# Patient Record
Sex: Female | Born: 1950 | Race: Black or African American | Hispanic: No | Marital: Single | Smoking: Former smoker
Health system: Southern US, Community
[De-identification: ages and names within clinical notes are randomized; demographics above are authoritative.]

## PROBLEM LIST (undated history)

## (undated) DIAGNOSIS — E785 Hyperlipidemia, unspecified: Secondary | ICD-10-CM

## (undated) DIAGNOSIS — F419 Anxiety disorder, unspecified: Secondary | ICD-10-CM

## (undated) DIAGNOSIS — F32A Depression, unspecified: Secondary | ICD-10-CM

## (undated) DIAGNOSIS — T884XXA Failed or difficult intubation, initial encounter: Secondary | ICD-10-CM

## (undated) DIAGNOSIS — Z803 Family history of malignant neoplasm of breast: Secondary | ICD-10-CM

## (undated) DIAGNOSIS — F329 Major depressive disorder, single episode, unspecified: Secondary | ICD-10-CM

## (undated) DIAGNOSIS — M1990 Unspecified osteoarthritis, unspecified site: Secondary | ICD-10-CM

## (undated) DIAGNOSIS — T7840XA Allergy, unspecified, initial encounter: Secondary | ICD-10-CM

## (undated) DIAGNOSIS — R0600 Dyspnea, unspecified: Secondary | ICD-10-CM

## (undated) DIAGNOSIS — M1711 Unilateral primary osteoarthritis, right knee: Secondary | ICD-10-CM

## (undated) DIAGNOSIS — I8390 Asymptomatic varicose veins of unspecified lower extremity: Secondary | ICD-10-CM

## (undated) HISTORY — DX: Hyperlipidemia, unspecified: E78.5

## (undated) HISTORY — DX: Anxiety disorder, unspecified: F41.9

## (undated) HISTORY — DX: Unspecified osteoarthritis, unspecified site: M19.90

## (undated) HISTORY — PX: KNEE SURGERY: SHX244

## (undated) HISTORY — PX: HYSTEROSCOPY: SHX211

## (undated) HISTORY — DX: Major depressive disorder, single episode, unspecified: F32.9

## (undated) HISTORY — DX: Family history of malignant neoplasm of breast: Z80.3

## (undated) HISTORY — DX: Allergy, unspecified, initial encounter: T78.40XA

## (undated) HISTORY — DX: Depression, unspecified: F32.A

## (undated) HISTORY — PX: GUM SURGERY: SHX658

---

## 2000-07-02 ENCOUNTER — Encounter: Payer: Self-pay | Admitting: Obstetrics and Gynecology

## 2000-07-02 ENCOUNTER — Encounter: Admission: RE | Admit: 2000-07-02 | Discharge: 2000-07-02 | Payer: Self-pay | Admitting: Obstetrics and Gynecology

## 2001-07-12 ENCOUNTER — Encounter: Admission: RE | Admit: 2001-07-12 | Discharge: 2001-07-12 | Payer: Self-pay | Admitting: Obstetrics and Gynecology

## 2001-07-12 ENCOUNTER — Encounter: Payer: Self-pay | Admitting: Obstetrics and Gynecology

## 2001-09-04 ENCOUNTER — Other Ambulatory Visit: Admission: RE | Admit: 2001-09-04 | Discharge: 2001-09-04 | Payer: Self-pay | Admitting: Obstetrics and Gynecology

## 2002-04-15 ENCOUNTER — Other Ambulatory Visit: Admission: RE | Admit: 2002-04-15 | Discharge: 2002-04-15 | Payer: Self-pay | Admitting: Family Medicine

## 2002-07-14 ENCOUNTER — Encounter: Payer: Self-pay | Admitting: Family Medicine

## 2002-07-14 ENCOUNTER — Encounter: Admission: RE | Admit: 2002-07-14 | Discharge: 2002-07-14 | Payer: Self-pay | Admitting: Family Medicine

## 2003-04-13 ENCOUNTER — Other Ambulatory Visit: Admission: RE | Admit: 2003-04-13 | Discharge: 2003-04-13 | Payer: Self-pay | Admitting: Family Medicine

## 2003-07-20 ENCOUNTER — Encounter: Admission: RE | Admit: 2003-07-20 | Discharge: 2003-07-20 | Payer: Self-pay | Admitting: Family Medicine

## 2003-07-20 ENCOUNTER — Encounter: Payer: Self-pay | Admitting: Family Medicine

## 2004-04-13 ENCOUNTER — Other Ambulatory Visit: Admission: RE | Admit: 2004-04-13 | Discharge: 2004-04-13 | Payer: Self-pay | Admitting: Family Medicine

## 2004-07-25 ENCOUNTER — Encounter: Admission: RE | Admit: 2004-07-25 | Discharge: 2004-07-25 | Payer: Self-pay | Admitting: Family Medicine

## 2005-05-23 ENCOUNTER — Ambulatory Visit: Payer: Self-pay | Admitting: Family Medicine

## 2005-06-16 ENCOUNTER — Other Ambulatory Visit: Admission: RE | Admit: 2005-06-16 | Discharge: 2005-06-16 | Payer: Self-pay | Admitting: Family Medicine

## 2005-06-16 ENCOUNTER — Ambulatory Visit: Payer: Self-pay | Admitting: Family Medicine

## 2005-06-21 ENCOUNTER — Encounter: Admission: RE | Admit: 2005-06-21 | Discharge: 2005-06-21 | Payer: Self-pay | Admitting: Family Medicine

## 2005-06-23 ENCOUNTER — Ambulatory Visit: Payer: Self-pay | Admitting: Family Medicine

## 2005-07-28 ENCOUNTER — Encounter: Admission: RE | Admit: 2005-07-28 | Discharge: 2005-07-28 | Payer: Self-pay | Admitting: Family Medicine

## 2005-08-31 ENCOUNTER — Ambulatory Visit: Payer: Self-pay | Admitting: Family Medicine

## 2005-09-19 ENCOUNTER — Ambulatory Visit (HOSPITAL_COMMUNITY): Admission: RE | Admit: 2005-09-19 | Discharge: 2005-09-19 | Payer: Self-pay | Admitting: Obstetrics and Gynecology

## 2005-09-19 ENCOUNTER — Encounter (INDEPENDENT_AMBULATORY_CARE_PROVIDER_SITE_OTHER): Payer: Self-pay | Admitting: Specialist

## 2005-10-30 ENCOUNTER — Ambulatory Visit: Payer: Self-pay | Admitting: Family Medicine

## 2006-01-09 ENCOUNTER — Ambulatory Visit: Payer: Self-pay | Admitting: Family Medicine

## 2006-06-15 ENCOUNTER — Ambulatory Visit: Payer: Self-pay | Admitting: Family Medicine

## 2006-06-29 ENCOUNTER — Other Ambulatory Visit: Admission: RE | Admit: 2006-06-29 | Discharge: 2006-06-29 | Payer: Self-pay | Admitting: Family Medicine

## 2006-06-29 ENCOUNTER — Ambulatory Visit: Payer: Self-pay | Admitting: Family Medicine

## 2006-07-30 ENCOUNTER — Encounter: Admission: RE | Admit: 2006-07-30 | Discharge: 2006-07-30 | Payer: Self-pay | Admitting: Family Medicine

## 2007-07-04 DIAGNOSIS — F329 Major depressive disorder, single episode, unspecified: Secondary | ICD-10-CM

## 2007-07-04 DIAGNOSIS — J309 Allergic rhinitis, unspecified: Secondary | ICD-10-CM

## 2007-07-04 DIAGNOSIS — F32A Depression, unspecified: Secondary | ICD-10-CM | POA: Insufficient documentation

## 2007-07-19 ENCOUNTER — Ambulatory Visit: Payer: Self-pay | Admitting: Family Medicine

## 2007-07-19 LAB — CONVERTED CEMR LAB
ALT: 18 units/L (ref 0–35)
AST: 19 units/L (ref 0–37)
Albumin: 3.6 g/dL (ref 3.5–5.2)
Bilirubin Urine: NEGATIVE
Bilirubin, Direct: 0.1 mg/dL (ref 0.0–0.3)
Calcium: 9.2 mg/dL (ref 8.4–10.5)
Chloride: 104 meq/L (ref 96–112)
Cholesterol: 166 mg/dL (ref 0–200)
Creatinine, Ser: 0.7 mg/dL (ref 0.4–1.2)
GFR calc non Af Amer: 92 mL/min
HDL: 42.2 mg/dL (ref >39.0)
Hemoglobin: 13.4 g/dL (ref 12.0–15.0)
LDL Cholesterol: 104 mg/dL — ABNORMAL HIGH (ref 0–99)
Lymphocytes Relative: 23.9 % (ref 12.0–46.0)
MCHC: 33.7 g/dL (ref 30.0–36.0)
MCV: 82.4 fL (ref 78.0–100.0)
Platelets: 295 10*3/uL (ref 150–400)
Potassium: 4.3 meq/L (ref 3.5–5.1)
Protein, U semiquant: NEGATIVE
RDW: 12.8 % (ref 11.5–14.6)
Specific Gravity, Urine: 1.015
TSH: 1.96 microintl units/mL (ref 0.35–5.50)
Total Bilirubin: 0.9 mg/dL (ref 0.3–1.2)
Total CHOL/HDL Ratio: 3.9
Total Protein: 6.3 g/dL (ref 6.0–8.3)
VLDL: 20 mg/dL (ref 0–40)
WBC: 6 10*3/uL (ref 4.5–10.5)
pH: 7.5

## 2007-07-25 ENCOUNTER — Encounter: Payer: Self-pay | Admitting: Family Medicine

## 2007-07-25 ENCOUNTER — Other Ambulatory Visit: Admission: RE | Admit: 2007-07-25 | Discharge: 2007-07-25 | Payer: Self-pay | Admitting: Family Medicine

## 2007-07-26 ENCOUNTER — Ambulatory Visit: Payer: Self-pay | Admitting: Family Medicine

## 2007-07-26 DIAGNOSIS — E785 Hyperlipidemia, unspecified: Secondary | ICD-10-CM

## 2007-07-31 ENCOUNTER — Ambulatory Visit: Payer: Self-pay | Admitting: Internal Medicine

## 2007-07-31 ENCOUNTER — Encounter: Payer: Self-pay | Admitting: Family Medicine

## 2007-08-01 ENCOUNTER — Encounter: Admission: RE | Admit: 2007-08-01 | Discharge: 2007-08-01 | Payer: Self-pay | Admitting: Family Medicine

## 2007-08-07 ENCOUNTER — Encounter: Admission: RE | Admit: 2007-08-07 | Discharge: 2007-08-07 | Payer: Self-pay | Admitting: Family Medicine

## 2007-08-13 ENCOUNTER — Ambulatory Visit: Payer: Self-pay | Admitting: Internal Medicine

## 2007-08-13 ENCOUNTER — Telehealth: Payer: Self-pay | Admitting: Family Medicine

## 2007-08-16 ENCOUNTER — Telehealth: Payer: Self-pay | Admitting: Family Medicine

## 2007-09-05 ENCOUNTER — Ambulatory Visit: Payer: Self-pay | Admitting: Internal Medicine

## 2008-07-21 ENCOUNTER — Ambulatory Visit: Payer: Self-pay | Admitting: Family Medicine

## 2008-07-21 LAB — CONVERTED CEMR LAB
Albumin: 3.8 g/dL (ref 3.5–5.2)
BUN: 12 mg/dL (ref 6–23)
Basophils Relative: 0.5 % (ref 0.0–3.0)
Blood in Urine, dipstick: NEGATIVE
Chloride: 104 meq/L (ref 96–112)
Creatinine, Ser: 0.6 mg/dL (ref 0.4–1.2)
Eosinophils Relative: 2.6 % (ref 0.0–5.0)
Glucose, Bld: 80 mg/dL (ref 70–99)
HCT: 37.7 % (ref 36.0–46.0)
HDL: 50.4 mg/dL (ref >39.0)
Ketones, urine, test strip: NEGATIVE
LDL Cholesterol: 91 mg/dL (ref 0–99)
MCHC: 33.3 g/dL (ref 30.0–36.0)
MCV: 83 fL (ref 78.0–100.0)
Neutro Abs: 3.8 10*3/uL (ref 1.4–7.7)
RBC: 4.54 M/uL (ref 3.87–5.11)
RDW: 13.2 % (ref 11.5–14.6)
Sodium: 143 meq/L (ref 135–145)
Specific Gravity, Urine: 1.015
TSH: 1.23 microintl units/mL (ref 0.35–5.50)
Triglycerides: 132 mg/dL (ref 0–149)
VLDL: 26 mg/dL (ref 0–40)
WBC Urine, dipstick: NEGATIVE
WBC: 5.7 10*3/uL (ref 4.5–10.5)

## 2008-07-28 ENCOUNTER — Encounter: Payer: Self-pay | Admitting: Family Medicine

## 2008-07-28 ENCOUNTER — Other Ambulatory Visit: Admission: RE | Admit: 2008-07-28 | Discharge: 2008-07-28 | Payer: Self-pay | Admitting: Family Medicine

## 2008-07-28 ENCOUNTER — Ambulatory Visit: Payer: Self-pay | Admitting: Family Medicine

## 2008-08-03 ENCOUNTER — Encounter: Admission: RE | Admit: 2008-08-03 | Discharge: 2008-08-03 | Payer: Self-pay | Admitting: Family Medicine

## 2008-09-28 ENCOUNTER — Telehealth: Payer: Self-pay | Admitting: *Deleted

## 2008-12-10 ENCOUNTER — Ambulatory Visit: Payer: Self-pay | Admitting: Family Medicine

## 2009-04-08 DIAGNOSIS — R1031 Right lower quadrant pain: Secondary | ICD-10-CM | POA: Insufficient documentation

## 2009-04-22 ENCOUNTER — Ambulatory Visit: Payer: Self-pay | Admitting: Family Medicine

## 2009-04-23 ENCOUNTER — Encounter: Admission: RE | Admit: 2009-04-23 | Discharge: 2009-04-23 | Payer: Self-pay | Admitting: Family Medicine

## 2009-04-29 ENCOUNTER — Ambulatory Visit: Payer: Self-pay | Admitting: Family Medicine

## 2009-04-29 DIAGNOSIS — N83209 Unspecified ovarian cyst, unspecified side: Secondary | ICD-10-CM | POA: Insufficient documentation

## 2009-07-26 ENCOUNTER — Ambulatory Visit: Payer: Self-pay | Admitting: Family Medicine

## 2009-07-26 LAB — CONVERTED CEMR LAB
Albumin: 3.8 g/dL (ref 3.5–5.2)
Basophils Absolute: 0 10*3/uL (ref 0.0–0.1)
Basophils Relative: 0.5 % (ref 0.0–3.0)
Bilirubin Urine: NEGATIVE
Chloride: 108 meq/L (ref 96–112)
Eosinophils Absolute: 0.1 10*3/uL (ref 0.0–0.7)
Eosinophils Relative: 3 % (ref 0.0–5.0)
GFR calc non Af Amer: 109.82 mL/min (ref >60)
Glucose, Urine, Semiquant: NEGATIVE
HCT: 38.5 % (ref 36.0–46.0)
HDL: 47.2 mg/dL (ref >39.00)
Ketones, urine, test strip: NEGATIVE
LDL Cholesterol: 109 mg/dL — ABNORMAL HIGH (ref 0–99)
Lymphocytes Relative: 29.4 % (ref 12.0–46.0)
MCV: 82.8 fL (ref 78.0–100.0)
Monocytes Absolute: 0.6 10*3/uL (ref 0.1–1.0)
Monocytes Relative: 13 % — ABNORMAL HIGH (ref 3.0–12.0)
Neutro Abs: 2.8 10*3/uL (ref 1.4–7.7)
Nitrite: NEGATIVE
Platelets: 276 10*3/uL (ref 150.0–400.0)
Sodium: 147 meq/L — ABNORMAL HIGH (ref 135–145)
Specific Gravity, Urine: 1.015
TSH: 1.59 microintl units/mL (ref 0.35–5.50)
Total Protein: 6.9 g/dL (ref 6.0–8.3)
Triglycerides: 112 mg/dL (ref 0.0–149.0)
WBC: 5 10*3/uL (ref 4.5–10.5)
pH: 7

## 2009-08-01 ENCOUNTER — Encounter: Payer: Self-pay | Admitting: Family Medicine

## 2009-08-02 ENCOUNTER — Encounter: Payer: Self-pay | Admitting: Family Medicine

## 2009-08-02 ENCOUNTER — Ambulatory Visit: Payer: Self-pay | Admitting: Family Medicine

## 2009-08-02 ENCOUNTER — Other Ambulatory Visit: Admission: RE | Admit: 2009-08-02 | Discharge: 2009-08-02 | Payer: Self-pay | Admitting: Family Medicine

## 2009-08-04 ENCOUNTER — Encounter: Admission: RE | Admit: 2009-08-04 | Discharge: 2009-08-04 | Payer: Self-pay | Admitting: Family Medicine

## 2009-08-16 ENCOUNTER — Ambulatory Visit: Payer: Self-pay | Admitting: Internal Medicine

## 2009-08-16 ENCOUNTER — Encounter: Payer: Self-pay | Admitting: Family Medicine

## 2009-10-22 ENCOUNTER — Telehealth: Payer: Self-pay | Admitting: *Deleted

## 2009-10-26 ENCOUNTER — Encounter: Payer: Self-pay | Admitting: Family Medicine

## 2010-07-28 ENCOUNTER — Ambulatory Visit: Payer: Self-pay | Admitting: Family Medicine

## 2010-07-28 LAB — CONVERTED CEMR LAB
ALT: 18 units/L (ref 0–35)
AST: 21 units/L (ref 0–37)
BUN: 13 mg/dL (ref 6–23)
Basophils Absolute: 0.1 10*3/uL (ref 0.0–0.1)
Bilirubin, Direct: 0.1 mg/dL (ref 0.0–0.3)
Blood in Urine, dipstick: NEGATIVE
CO2: 29 meq/L (ref 19–32)
Calcium: 9.6 mg/dL (ref 8.4–10.5)
Chloride: 104 meq/L (ref 96–112)
Cholesterol: 180 mg/dL (ref 0–200)
Eosinophils Absolute: 0.2 10*3/uL (ref 0.0–0.7)
Glucose, Bld: 76 mg/dL (ref 70–99)
Hemoglobin: 12.5 g/dL (ref 12.0–15.0)
LDL Cholesterol: 101 mg/dL — ABNORMAL HIGH (ref 0–99)
Lymphs Abs: 1.6 10*3/uL (ref 0.7–4.0)
MCHC: 32.8 g/dL (ref 30.0–36.0)
MCV: 83.3 fL (ref 78.0–100.0)
Neutro Abs: 3.6 10*3/uL (ref 1.4–7.7)
Neutrophils Relative %: 60.2 % (ref 43.0–77.0)
Platelets: 270 10*3/uL (ref 150.0–400.0)
Specific Gravity, Urine: 1.02
TSH: 2.02 microintl units/mL (ref 0.35–5.50)
Total Bilirubin: 0.9 mg/dL (ref 0.3–1.2)
Total CHOL/HDL Ratio: 4
Urobilinogen, UA: 0.2
pH: 6.5

## 2010-08-04 ENCOUNTER — Ambulatory Visit: Payer: Self-pay | Admitting: Family Medicine

## 2010-08-04 ENCOUNTER — Other Ambulatory Visit: Admission: RE | Admit: 2010-08-04 | Discharge: 2010-08-04 | Payer: Self-pay | Admitting: Family Medicine

## 2010-08-04 ENCOUNTER — Telehealth: Payer: Self-pay

## 2010-08-05 ENCOUNTER — Encounter: Admission: RE | Admit: 2010-08-05 | Discharge: 2010-08-05 | Payer: Self-pay | Admitting: Family Medicine

## 2010-09-08 ENCOUNTER — Telehealth: Payer: Self-pay | Admitting: Family Medicine

## 2010-09-12 ENCOUNTER — Telehealth: Payer: Self-pay | Admitting: Family Medicine

## 2010-11-22 ENCOUNTER — Telehealth: Payer: Self-pay | Admitting: Family Medicine

## 2011-01-26 NOTE — Progress Notes (Signed)
Summary: celexa refill  Phone Note Call from Patient   Caller: Patient Call For: Roderick Pee MD Reason for Call: Refill Medication Summary of Call: patient is calling because she has increased her celexa to 2 at bedtime.  she would like a refill. is this okay to fill? Initial call taken by: Kern Reap CMA Duncan Dull),  September 08, 2010 4:56 PM  Follow-up for Phone Call        ok Follow-up by: Roderick Pee MD,  September 08, 2010 5:43 PM    New/Updated Medications: CELEXA 20 MG  TABS (CITALOPRAM HYDROBROMIDE) 2 tabs  at bedtime Prescriptions: CELEXA 20 MG  TABS (CITALOPRAM HYDROBROMIDE) 2 tabs  at bedtime  #60 x 3   Entered by:   Kern Reap CMA (AAMA)   Authorized by:   Roderick Pee MD   Signed by:   Kern Reap CMA (AAMA) on 09/08/2010   Method used:   Electronically to        [facility] (retail)       [address]       [place], [place]  [id number]       Ph: [phone]       Fax: [phone]   RxID:   [id number]

## 2011-01-26 NOTE — Assessment & Plan Note (Signed)
Summary: CPX/PAP/CJR   Vital Signs:  Patient profile:   60 year old female Menstrual status:  postmenopausal Height:      63.75 inches Weight:      167 pounds Temp:     98.6 degrees F oral BP sitting:   120 / 80  (left arm) Cuff size:   regular  Vitals Entered By: Kathrynn Speed CMA (August 04, 2010 2:39 PM) CC: cpx, lab review, pap, right heel pain x one month, src     Menstrual Status postmenopausal Last PAP Result NEGATIVE FOR INTRAEPITHELIAL LESIONS OR MALIGNANCY.   CC:  cpx, lab review, pap, right heel pain x one month, and src.  History of Present Illness: Tamara Hughes is a 60 year old female, who comes in today for annual physical examination because of underlying hyperlipidemia and depression.  Her hyperlipidemia is managed with Zocor 40 mg nightly her lipids.  Her goal to side effect from medication.  Also, one baby aspirin daily.  She takes Celexa 20 mg nightly for mild depression because her son is still in jail.  She states she is sleeping okay, but has to take an over-the-counter sleeping aid.  We advise to increase the Celexa to 30 mg a day.  She gets routine eye care, dental care, annual mammography, tetanus, 2006, seasonal flu.  However, she does not check her breasts monthly.  Advise to do BSE monthly  Current Medications (verified): 1)  Zocor 40 Mg Tabs (Simvastatin) .[name].. 1qd 2)  Celexa 20 Mg  Tabs (Citalopram Hydrobromide) .[name].. 1 Tab Once Daily 3)  Asa 81 4)  Glucosamine-Chondroitin  Caps (Glucosamine-Chondroit-Vit C-Mn) .... Once Day  Allergies (verified): 1)  ! Pcn 2)  ! Doxycycline 3)  ! Indocin  Past History:  Past medical, surgical, family and social histories (including risk factors) reviewed, and no changes noted (except as noted below).  Past Medical History: Reviewed history from 07/28/2008 and no changes required. Allergic rhinitis Depression childbirth x 1 Hyperlipidemia  Family History: Reviewed history from 07/04/2007 and no changes  required. Family History Hypertension Family History Thyroid disease  Social History: Reviewed history from 07/28/2008 and no changes required. Single school teacher Occupation: Never Smoked Alcohol use-no Drug use-no Regular exercise-yes  Review of Systems      See HPI  Physical Exam  General:  Well-developed,well-nourished,in no acute distress; alert,appropriate and cooperative throughout examination Head:  Normocephalic and atraumatic without obvious abnormalities. No apparent alopecia or balding. Eyes:  No corneal or conjunctival inflammation noted. EOMI. Perrla. Funduscopic exam benign, without hemorrhages, exudates or papilledema. Vision grossly normal. Ears:  External ear exam shows no significant lesions or deformities.  Otoscopic examination reveals clear canals, tympanic membranes are intact bilaterally without bulging, retraction, inflammation or discharge. Hearing is grossly normal bilaterally. Nose:  External nasal examination shows no deformity or inflammation. Nasal mucosa are pink and moist without lesions or exudates. Mouth:  Oral mucosa and oropharynx without lesions or exudates.  Teeth in good repair. Neck:  No deformities, masses, or tenderness noted. Chest Wall:  No deformities, masses, or tenderness noted. Breasts:  No mass, nodules, thickening, tenderness, bulging, retraction, inflamation, nipple discharge or skin changes noted.   Lungs:  Normal respiratory effort, chest expands symmetrically. Lungs are clear to auscultation, no crackles or wheezes. Heart:  Normal rate and regular rhythm. S1 and S2 normal without gallop, murmur, click, rub or other extra sounds. Abdomen:  Bowel sounds positive,abdomen soft and non-tender without masses, organomegaly or hernias noted. Rectal:  No external abnormalities noted. Normal sphincter  tone. No rectal masses or tenderness. Genitalia:  Pelvic Exam:        External: normal female genitalia without lesions or masses         Vagina: normal without lesions or masses        Cervix: normal without lesions or masses        Adnexa: normal bimanual exam without masses or fullness        Uterus: normal by palpation        Pap smear: performed Msk:  No deformity or scoliosis noted of thoracic or lumbar spine.   Pulses:  R and L carotid,radial,femoral,dorsalis pedis and posterior tibial pulses are full and equal bilaterally Extremities:  No clubbing, cyanosis, edema, or deformity noted with normal full range of motion of all joints.   Neurologic:  No cranial nerve deficits noted. Station and gait are normal. Plantar reflexes are down-going bilaterally. DTRs are symmetrical throughout. Sensory, motor and coordinative functions appear intact. Skin:  Intact without suspicious lesions or rashes Cervical Nodes:  No lymphadenopathy noted Axillary Nodes:  No palpable lymphadenopathy Inguinal Nodes:  No significant adenopathy Psych:  Cognition and judgment appear intact. Alert and cooperative with normal attention span and concentration. No apparent delusions, illusions, hallucinations   Impression & Recommendations:  Problem # 1:  HYPERLIPIDEMIA (ICD-272.4) Assessment Improved  Her updated medication list for this problem includes:    Zocor 40 Mg Tabs (Simvastatin) .[name]... 1qd  Orders: Prescription Created Electronically [phone])  Problem # 2:  DEPRESSION (ICD-311) Assessment: Deteriorated  Her updated medication list for this problem includes:    Celexa 20 Mg Tabs (Citalopram hydrobromide) .[name]... 1 & 1/2 at bedtime  Orders: Prescription Created Electronically [phone])  Problem # 3:  HEALTH SCREENING (ICD-V70.0) Assessment: Unchanged  Orders: Prescription Created Electronically [phone]) EKG w/ Interpretation ([id number])  Complete Medication List: 1)  Zocor 40 Mg Tabs (Simvastatin) .[name].. 1qd 2)  Celexa 20 Mg Tabs (Citalopram hydrobromide) .[name].. 1 & 1/2 at bedtime 3)  Asa 81  4)  Glucosamine-chondroitin Caps  (Glucosamine-chondroit-vit c-mn) .... Once day  Patient Instructions: 1)  continue the Zocor, and aspirin. 2)  Increase the Celexa to 30 mg daily.  If you don't see much improvement in 4 weeks.  Increase it to 40 mg. 3)  Please schedule a follow-up appointment in 1 year. 4)  Schedule your mammogram./////////bse monthly 5)  Schedule a colonoscopy/sigmoidoscopy to help detect colon cancer. 6)  Take calcium +Vitamin D daily. 7)  Take an Aspirin every day. Prescriptions: CELEXA 20 MG  TABS (CITALOPRAM HYDROBROMIDE) 1 & 1/2 at bedtime  #150 x 3   Entered and Authorized by:   Roderick Pee MD   Signed by:   Roderick Pee MD on 08/04/2010   Method used:   Electronically to        [facility] [address] (retail)       [address]       [place], [place]  [id number]       Ph: [phone]       Fax: [phone]   RxID:   [id number] CELEXA 20 MG  TABS (CITALOPRAM HYDROBROMIDE) 1 tab once daily  #100 x 3   Entered and Authorized by:   Roderick Pee MD   Signed by:   Roderick Pee MD on 08/04/2010   Method used:   Electronically to        [facility] [address] (retail)       [address]       [place], [place]  [id number]       Ph: [phone]       Fax: [phone]   RxID:   [id number] ZOCOR 40 MG TABS (SIMVASTATIN) 1qd  #100 Tablet x 3   Entered and Authorized by:   Roderick Pee MD   Signed by:   Roderick Pee MD on 08/04/2010   Method used:   Electronically to        [facility] (retail)       [address]       [place], [place]  [id number]       Ph: [phone]       Fax: [phone]   RxID:   [id number]

## 2011-01-26 NOTE — Progress Notes (Signed)
Summary: PHARMACY VERIFICATION ON Rx  Phone Note From Pharmacy   Caller: [facility] Summary of Call: Pharmacy needs to know which Celexa 20mg   Rx is correct.... They adv that they received 2 different Rx for the same med, for the same pt?     Pharmacy can be reached at  [phone].  Initial call taken by: Debbra Riding,  August 04, 2010 3:55 PM  Follow-up for Phone Call        seen today - per dr. Nelida Meuse note - increaseing to 30mg  . disregurd 20mg . once daily - called pharmacy to ok. KIK Follow-up by: Duard Brady LPN,  August 04, 2010 4:51 PM

## 2011-01-26 NOTE — Progress Notes (Signed)
Summary: refill request 90 days  Phone Note Refill Request Message from:  Fax from Pharmacy on September 12, 2010 2:22 PM  Refills Requested: Medication #1:  CELEXA 20 MG  TABS 2 tabs  at bedtime Initial call taken by: Kern Reap CMA Duncan Dull),  September 12, 2010 2:22 PM    Prescriptions: CELEXA 20 MG  TABS (CITALOPRAM HYDROBROMIDE) 2 tabs  at bedtime  #180 x 3   Entered by:   Kern Reap CMA (AAMA)   Authorized by:   Roderick Pee MD   Signed by:   Kern Reap CMA (AAMA) on 09/12/2010   Method used:   Electronically to        [facility] [address] (retail)       [address]       [place], [place]  [id number]       Ph: [phone]       Fax: [phone]   RxID:   [id number]

## 2011-01-26 NOTE — Progress Notes (Signed)
Summary: Pt req med for sinus inf. Pls call in to [facility] on [employer] Note Call from Patient Call back at Work Phone [phone]   Caller: Patient Summary of Call: Pt called and is req a med for sinus inf. Pt has head congestion and drainage in throat for 3 wks.  Pls call in to [facility] on corner of [address] and [employer].  Initial call taken by: Lucy Antigua,  November 22, 2010 11:57 AM  Follow-up for Phone Call        prednisone 20 mg........... dispense 30 tablets........ directions two tabs x 3 days, one x 3 days, a half x 3 days, then half a tablet Monday, Wednesday, Friday, for a two week taper.  Office visit if symptoms do not improve after one week Follow-up by: Roderick Pee MD,  November 22, 2010 1:50 PM  Additional Follow-up for Phone Call Additional follow up Details #1::        Phone Call Completed, Rx Called In Additional Follow-up by: Kern Reap CMA Duncan Dull),  November 22, 2010 5:09 PM

## 2011-05-12 NOTE — Op Note (Signed)
NAMEJENNAE, HAKEEM                  ACCOUNT NO.:  [id number]   MEDICAL RECORD NO.:  [id number]          PATIENT TYPE:  AMB   LOCATION:  SDC                           FACILITY:  [facility]   PHYSICIAN:  Richardean Sale, M.D.   DATE OF BIRTH:  1951-04-10   DATE OF PROCEDURE:  09/19/2005  DATE OF DISCHARGE:                                 OPERATIVE REPORT   PREOPERATIVE DIAGNOSIS:  Menorrhagia, endometrial polyp   POSTOPERATIVE DIAGNOSIS:  Menorrhagia, endometrial polyp.   PROCEDURE:  Hysteroscopy D&C with removal of endometrial polyp.   SURGEON:  Richardean Sale, M.D.   ASSISTANT:  None.   ANESTHESIA:  General.   COMPLICATIONS:  None.   SPECIMENS:  Endometrial polyp and endometrial curettings to pathology.   ESTIMATED BLOOD LOSS:  100 mL   FLUID DEFICIT:  20 mL   INDICATIONS:  This is a 60 year old African-American female who has been  having progressively worsening menses that have been slightly improved with  oral contraceptive pills. The patient underwent ultrasound evaluation and  sonohysterogram which revealed presence of an endometrial polyp. The patient  presents today for hysteroscopy D&C and removal of the polyp. Prior to  procedure, the risks, benefits, alternatives of the procedure reviewed with  the patient in detail. Discussed the risk which include but are not limited  to hemorrhage, infection, uterine perforation which could require additional  surgery through the abdomen to evaluate for injury to the bowel or the  bladder and also reviewed general anesthetic related risks including deep  venous thrombosis. The patient voiced understanding of these risks before  proceeding to the OR and informed consent was obtained, all questions  answered.   DESCRIPTION OF PROCEDURE:  The patient was taken to the operating room where  she was given general anesthetic. She was prepped in usual sterile fashion  with Betadine, a red rubber catheter was used to drain the bladder.  Bimanual  examination confirmed the presence of a retroverted uterus approximately 6-8  weeks size, mobile, no adnexal masses palpated. A speculum was then placed  in the vagina and a paracervical block was administered using a total of 20  mL of 1% Nesacaine. Single tooth tenaculum was used to grasp the anterior  lip of cervix and the cervix was then very gently dilated with Hagar  dilators. The diagnostic hysteroscope was then introduced and revealed a  polyp originating from the fundus of the uterus. The tubal ostia appeared  normal. The rest of the cavity appeared normal. There did appear to be a  questionable polyp versus a fibroid that was very small originating from the  posterior aspect of the uterus at the uterocervical junction. The  hysteroscope was removed. The polyp forceps were introduced. The fundal  polyp was removed easily. This was followed by a sharp curettage. Specimens  were sent to pathology labeled as endometrial polyp and endometrial  curettings. At this point the diagnostic scope was reintroduced but  visualization was inadequate due to bleeding. I was unable to determine  whether or not the structure at the junction of the uterus and  cervix had  been removed. Therefore, the cervix was then dilated. An attempt was made to  insert the resectoscope but was unsuccessful due to inadequate dilation of  the cervix. At this point the diagnostic scope was reintroduced.  Visualization at this point was much better. There is no evidence of any  remaining polyp or any fibroid present in the cavity and no active bleeding,  and therefore the procedure was terminated. The hysteroscope was removed and  the single-tooth tenaculum was removed. There was very brisk bleeding coming  from the tenaculum site and a ring forceps was placed on the anterior lip of  the cervix with an attempt to stop the bleeding with compression. After  approximately 2 minutes the tenaculum was removed and  there was still brisk  bleeding coming from both tenaculum sites. Therefore silver nitrate was  applied but hemostasis could not be achieved. Therefore two sutures were  placed of  0 Vicryl in a figure-of-eight fashion over both tenaculum sites  and hemostasis was achieved. At this point all instruments were removed from  the patient's vagina. All sponge, lap, needle, and instrument counts were  correct x2. The patient was then taken out dorsal lithotomy position and was  awakened from anesthesia and was transferred to recovery room awake and in  stable condition.  There were no complications.   Given the patient's significant bleeding from tenaculum site, a bleeding  time and coagulation studies have been ordered.      Richardean Sale, M.D.  Electronically Signed     JW/MEDQ  D:  09/19/2005  T:  09/20/2005  Job:  [id number]   cc:   Tinnie Gens A. Tawanna Cooler, M.D. [facility]  [address] [place]  [place] [address]

## 2011-06-29 ENCOUNTER — Encounter: Payer: Self-pay | Admitting: Family Medicine

## 2011-06-29 ENCOUNTER — Ambulatory Visit (INDEPENDENT_AMBULATORY_CARE_PROVIDER_SITE_OTHER): Payer: BC Managed Care – PPO | Admitting: Family Medicine

## 2011-06-29 DIAGNOSIS — N644 Mastodynia: Secondary | ICD-10-CM

## 2011-06-29 MED ORDER — CITALOPRAM HYDROBROMIDE 20 MG PO TABS: 20.0000 mg | ORAL_TABLET | Freq: Every day | ORAL | Status: DC

## 2011-06-29 NOTE — Patient Instructions (Signed)
Take Motrin, 600 mg twice daily with food for the chest wall pain as needed.  Return p.r.n.  Continue to check her breasts monthly.  Annual mammography in August

## 2011-06-29 NOTE — Progress Notes (Signed)
  Subjective:    Patient ID: Tamara Hughes, female    DOB: Jul 04, 1951, 60 y.o.   MRN: [id number]  HPI Tamara Hughes is a 60 year old female, married, nonsmoker, who comes in today with a month's history of soreness in both breasts.  Two months ago she started exercise program and has been doing a lot of upper body exercises.  A month ago she began having soreness in both breasts.  She states as a dull ache.  It comes and goes.  She points to the 6 o'clock position in both breasts.  On the periphery as a source of her discomfort.   Review of Systems    General and GYN review of systems otherwise negative.  Last mammogram August 2011 normal Objective:   Physical Exam    Logo punishing in no acute distress.  Examination of both breasts as the breast to be symmetrical.  No skin changes.  No rash.  No nipple discharge.  No palpable abnormalities.  There is tenderness at the 6 o'clock position.  Both breasts, but it's in the chest wall muscles    Assessment & Plan:  Chest wall pain reassured Motrin, 600 b.i.d.

## 2011-07-14 ENCOUNTER — Other Ambulatory Visit: Payer: Self-pay | Admitting: Family Medicine

## 2011-07-14 DIAGNOSIS — Z1231 Encounter for screening mammogram for malignant neoplasm of breast: Secondary | ICD-10-CM

## 2011-08-02 ENCOUNTER — Other Ambulatory Visit (INDEPENDENT_AMBULATORY_CARE_PROVIDER_SITE_OTHER): Payer: BC Managed Care – PPO

## 2011-08-02 DIAGNOSIS — Z0000 Encounter for general adult medical examination without abnormal findings: Secondary | ICD-10-CM

## 2011-08-02 LAB — LIPID PANEL
HDL: 60.5 mg/dL (ref >39.00)
LDL Cholesterol: 98 mg/dL (ref 0–99)
Total CHOL/HDL Ratio: 3
Triglycerides: 124 mg/dL (ref 0.0–149.0)

## 2011-08-02 LAB — BASIC METABOLIC PANEL
CO2: 30 mEq/L (ref 19–32)
Chloride: 104 mEq/L (ref 96–112)
Sodium: 142 mEq/L (ref 135–145)

## 2011-08-02 LAB — POCT URINALYSIS DIPSTICK
Bilirubin, UA: NEGATIVE
Blood, UA: NEGATIVE
Glucose, UA: NEGATIVE
Leukocytes, UA: NEGATIVE
Nitrite, UA: NEGATIVE
Urobilinogen, UA: 0.2

## 2011-08-02 LAB — CBC WITH DIFFERENTIAL/PLATELET
Basophils Relative: 0.8 % (ref 0.0–3.0)
Eosinophils Absolute: 0.2 10*3/uL (ref 0.0–0.7)
Eosinophils Relative: 3.8 % (ref 0.0–5.0)
Hemoglobin: 12.8 g/dL (ref 12.0–15.0)
Lymphocytes Relative: 23.5 % (ref 12.0–46.0)
MCHC: 32.4 g/dL (ref 30.0–36.0)
MCV: 82.6 fl (ref 78.0–100.0)
Neutro Abs: 3.6 10*3/uL (ref 1.4–7.7)
RBC: 4.78 Mil/uL (ref 3.87–5.11)
WBC: 5.9 10*3/uL (ref 4.5–10.5)

## 2011-08-02 LAB — TSH: TSH: 1.21 u[IU]/mL (ref 0.35–5.50)

## 2011-08-02 LAB — HEPATIC FUNCTION PANEL
AST: 23 U/L (ref 0–37)
Albumin: 4.1 g/dL (ref 3.5–5.2)
Total Bilirubin: 0.9 mg/dL (ref 0.3–1.2)

## 2011-08-07 ENCOUNTER — Ambulatory Visit
Admission: RE | Admit: 2011-08-07 | Discharge: 2011-08-07 | Disposition: A | Discharge status: Home / Self Care | Payer: BC Managed Care – PPO | Source: Ambulatory Visit | Attending: Family Medicine | Admitting: Family Medicine

## 2011-08-07 DIAGNOSIS — Z1231 Encounter for screening mammogram for malignant neoplasm of breast: Secondary | ICD-10-CM

## 2011-08-14 ENCOUNTER — Encounter: Payer: Self-pay | Admitting: Family Medicine

## 2011-08-15 ENCOUNTER — Ambulatory Visit (INDEPENDENT_AMBULATORY_CARE_PROVIDER_SITE_OTHER): Payer: BC Managed Care – PPO | Admitting: Family Medicine

## 2011-08-15 ENCOUNTER — Other Ambulatory Visit (HOSPITAL_COMMUNITY)
Admission: RE | Admit: 2011-08-15 | Discharge: 2011-08-15 | Disposition: A | Discharge status: Home / Self Care | Payer: BC Managed Care – PPO | Source: Ambulatory Visit | Attending: Family Medicine | Admitting: Family Medicine

## 2011-08-15 ENCOUNTER — Encounter: Payer: Self-pay | Admitting: Family Medicine

## 2011-08-15 DIAGNOSIS — Z0000 Encounter for general adult medical examination without abnormal findings: Secondary | ICD-10-CM

## 2011-08-15 DIAGNOSIS — J309 Allergic rhinitis, unspecified: Secondary | ICD-10-CM

## 2011-08-15 DIAGNOSIS — F329 Major depressive disorder, single episode, unspecified: Secondary | ICD-10-CM

## 2011-08-15 DIAGNOSIS — Z01419 Encounter for gynecological examination (general) (routine) without abnormal findings: Secondary | ICD-10-CM | POA: Insufficient documentation

## 2011-08-15 DIAGNOSIS — E782 Mixed hyperlipidemia: Secondary | ICD-10-CM

## 2011-08-15 MED ORDER — SIMVASTATIN 40 MG PO TABS: 40.0000 mg | ORAL_TABLET | Freq: Every day | ORAL | Status: DC

## 2011-08-15 MED ORDER — CITALOPRAM HYDROBROMIDE 40 MG PO TABS: 40.0000 mg | ORAL_TABLET | Freq: Every day | ORAL | Status: DC

## 2011-08-15 NOTE — Patient Instructions (Signed)
Continue your current medications.  Follow-up in one year, sooner if any problems 

## 2011-08-15 NOTE — Progress Notes (Signed)
  Subjective:    Patient ID: Tamara Hughes, female    DOB: 10-18-1951, 60 y.o.   MRN: [id number]  Tamara Hughes Is a 60 year old female, nonsmoker, who comes in today for evaluation depression, and hyperlipidemia.  She currently takes Celexa 40 mg daily for depression and doing well.  She also takes Zocor 40 mg nightly along with baby aspirin, Lopressor, ago, with an LDL of 98.  She gets routine eye care, dental care, breast exam monthly, and your mammography, colonoscopy, normal, tetanus, 2006.  Information given on shingles.  Vaccination    Review of Systems  Constitutional: Negative.   HENT: Negative.   Eyes: Negative.   Respiratory: Negative.   Cardiovascular: Negative.   Gastrointestinal: Negative.   Genitourinary: Negative.   Musculoskeletal: Negative.   Neurological: Negative.   Hematological: Negative.   Psychiatric/Behavioral: Negative.        Objective:   Physical Exam  Constitutional: She appears well-developed and well-nourished.  HENT:  Head: Normocephalic and atraumatic.  Right Ear: External ear normal.  Left Ear: External ear normal.  Nose: Nose normal.  Mouth/Throat: Oropharynx is clear and moist.  Eyes: EOM are normal. Pupils are equal, round, and reactive to light.  Neck: Normal range of motion. Neck supple. No thyromegaly present.  Cardiovascular: Normal rate, regular rhythm, normal heart sounds and intact distal pulses.  Exam reveals no gallop and no friction rub.   No murmur heard. Pulmonary/Chest: Effort normal and breath sounds normal.  Abdominal: Soft. Bowel sounds are normal. She exhibits no distension and no mass. There is no tenderness. There is no rebound.  Genitourinary: Vagina normal and uterus normal. Guaiac negative stool. No vaginal discharge found.       Bilateral breast exam normal  Musculoskeletal: Normal range of motion.  Lymphadenopathy:    She has no cervical adenopathy.  Neurological: She is alert. She has normal reflexes. No cranial nerve  deficit. She exhibits normal muscle tone. Coordination normal.  Skin: Skin is warm and dry.  Psychiatric: She has a normal mood and affect. Her behavior is normal. Judgment and thought content normal.          Assessment & Plan:  Healthy female.  Depression, secondary to the fact that her son is still in jail.  Continue Celexa 40 mg daily............. He was given a 26 year sentence so far.  He served 11 years.  Hyperlipidemia.  Continue Zocor 40 and an aspirin tablet

## 2011-09-25 ENCOUNTER — Other Ambulatory Visit: Payer: Self-pay | Admitting: Family Medicine

## 2011-12-01 ENCOUNTER — Ambulatory Visit (INDEPENDENT_AMBULATORY_CARE_PROVIDER_SITE_OTHER): Payer: BC Managed Care – PPO | Admitting: Family Medicine

## 2011-12-01 ENCOUNTER — Encounter: Payer: Self-pay | Admitting: Family Medicine

## 2011-12-01 VITALS — BP 140/84 | Temp 98.6°F | Wt 168.0 lb

## 2011-12-01 DIAGNOSIS — N949 Unspecified condition associated with female genital organs and menstrual cycle: Secondary | ICD-10-CM

## 2011-12-01 DIAGNOSIS — R102 Pelvic and perineal pain: Secondary | ICD-10-CM

## 2011-12-01 LAB — CBC WITH DIFFERENTIAL/PLATELET
Basophils Absolute: 0 10*3/uL (ref 0.0–0.1)
HCT: 40.2 % (ref 36.0–46.0)
Lymphocytes Relative: 27 % (ref 12–46)
Neutro Abs: 3.7 10*3/uL (ref 1.7–7.7)
Neutrophils Relative %: 57 % (ref 43–77)
Platelets: 298 10*3/uL (ref 150–400)
RDW: 14.4 % (ref 11.5–15.5)
WBC: 6.5 10*3/uL (ref 4.0–10.5)

## 2011-12-01 LAB — POCT URINALYSIS DIPSTICK
Ketones, UA: NEGATIVE
Leukocytes, UA: NEGATIVE
Nitrite, UA: NEGATIVE
Protein, UA: NEGATIVE
Urobilinogen, UA: 0.2

## 2011-12-01 NOTE — Progress Notes (Signed)
  Subjective:    Patient ID: Tamara Hughes, female    DOB: [birth year], 60 y.o.   MRN: [id number]  HPI  Acute visit for one week history of right lower abdominal/pelvic pain. Duration is only a few seconds. Quality of pain is sharp. No clear exacerbating factors. No alleviating factors. Non-positional. No change in bowel habits. Patient denies any fever, chills, appetite or weight changes, urinary symptoms, or any vaginal spotting. Postmenopausal for 2 years now. Patient had normal Pap smear  this past August.  Relates prior colonoscopy within the past few years but not sure of exact date. No current pain. She is concerned because of duration of approximately one week. No history of any abdominal surgery.  Past Medical History  Diagnosis Date  . Allergy   . Depression   . Hyperlipidemia    No past surgical history on file.  reports that she has never smoked. She does not have any smokeless tobacco history on file. Her alcohol and drug histories not on file. family history includes Hyperlipidemia in her other; Hypertension in her other; and Thyroid disease in her other. Allergies  Allergen Reactions  . Doxycycline     REACTION: rash  . Indomethacin     REACTION: upset stomach  . Penicillins     REACTION: Hives      Review of Systems  Constitutional: Negative for fever, chills, appetite change and unexpected weight change.  Respiratory: Negative for cough and shortness of breath.   Cardiovascular: Negative for chest pain and leg swelling.  Gastrointestinal: Positive for abdominal pain. Negative for nausea, vomiting, diarrhea, constipation and blood in stool.  Genitourinary: Negative for dysuria, hematuria and flank pain.  Musculoskeletal: Negative for back pain.  Skin: Negative for rash.  Neurological: Negative for dizziness.       Objective:   Physical Exam  Constitutional: She appears well-developed and well-nourished. No distress.  HENT:  Mouth/Throat: Oropharynx is clear  and moist.  Cardiovascular: Normal rate and regular rhythm.   Pulmonary/Chest: Effort normal and breath sounds normal. No respiratory distress. She has no wheezes. She has no rales.  Abdominal: Soft. Bowel sounds are normal. She exhibits no distension and no mass. There is no rebound and no guarding.       Mildly tender right lower quadrant. No guarding or rebound  Genitourinary:       Bimanual exam reveals normal size uterus. No cervical motion tenderness. No adnexal masses or tenderness noted.          Assessment & Plan:  Right-sided pelvic pain. This is very fleeting only lasting a few seconds. Fairly nonfocal exam. Check urinalysis and CBC. Schedule pelvic ultrasound to further assess

## 2011-12-01 NOTE — Patient Instructions (Signed)
Follow up promptly for any fever or worsening pain. We will call you regarding ultrasound appointment

## 2011-12-04 ENCOUNTER — Other Ambulatory Visit: Payer: Self-pay | Admitting: Family Medicine

## 2011-12-04 DIAGNOSIS — R102 Pelvic and perineal pain: Secondary | ICD-10-CM

## 2011-12-05 NOTE — Progress Notes (Signed)
Quick Note:  Pt informed on home VM ______ 

## 2011-12-07 ENCOUNTER — Ambulatory Visit
Admission: RE | Admit: 2011-12-07 | Discharge: 2011-12-07 | Disposition: A | Discharge status: Home / Self Care | Payer: BC Managed Care – PPO | Source: Ambulatory Visit | Attending: Family Medicine | Admitting: Family Medicine

## 2011-12-07 DIAGNOSIS — R102 Pelvic and perineal pain: Secondary | ICD-10-CM

## 2011-12-08 NOTE — Progress Notes (Signed)
Quick Note:  Pt informed on home VM ______ 

## 2012-06-26 ENCOUNTER — Telehealth: Payer: Self-pay | Admitting: Family Medicine

## 2012-06-26 NOTE — Telephone Encounter (Signed)
Patient is calling because her father is starting to sleep more.

## 2012-06-26 NOTE — Telephone Encounter (Signed)
Pt requesting to be contact by Fleet Contras.

## 2012-06-29 NOTE — Telephone Encounter (Signed)
Daughter came into the office with wife and I explained that this can be normal but to call if any other symptoms are seen.  Daughter verbally agreed.

## 2012-07-08 ENCOUNTER — Other Ambulatory Visit: Payer: Self-pay | Admitting: Family Medicine

## 2012-07-08 DIAGNOSIS — Z1231 Encounter for screening mammogram for malignant neoplasm of breast: Secondary | ICD-10-CM

## 2012-08-12 ENCOUNTER — Ambulatory Visit
Admission: RE | Admit: 2012-08-12 | Discharge: 2012-08-12 | Disposition: A | Discharge status: Home / Self Care | Payer: BC Managed Care – PPO | Source: Ambulatory Visit | Attending: Family Medicine | Admitting: Family Medicine

## 2012-08-12 DIAGNOSIS — Z1231 Encounter for screening mammogram for malignant neoplasm of breast: Secondary | ICD-10-CM

## 2012-08-29 ENCOUNTER — Other Ambulatory Visit: Payer: Self-pay | Admitting: Family Medicine

## 2012-09-13 ENCOUNTER — Other Ambulatory Visit (INDEPENDENT_AMBULATORY_CARE_PROVIDER_SITE_OTHER): Payer: BC Managed Care – PPO

## 2012-09-13 DIAGNOSIS — Z0000 Encounter for general adult medical examination without abnormal findings: Secondary | ICD-10-CM

## 2012-09-13 LAB — POCT URINALYSIS DIPSTICK
Bilirubin, UA: NEGATIVE
Leukocytes, UA: NEGATIVE
Nitrite, UA: NEGATIVE
Protein, UA: NEGATIVE
pH, UA: 7

## 2012-09-13 LAB — CBC WITH DIFFERENTIAL/PLATELET
Eosinophils Relative: 3 % (ref 0.0–5.0)
HCT: 39.8 % (ref 36.0–46.0)
Lymphs Abs: 1.1 10*3/uL (ref 0.7–4.0)
MCHC: 31.9 g/dL (ref 30.0–36.0)
MCV: 83.1 fl (ref 78.0–100.0)
Monocytes Absolute: 0.6 10*3/uL (ref 0.1–1.0)
Platelets: 255 10*3/uL (ref 150.0–400.0)
RDW: 14 % (ref 11.5–14.6)

## 2012-09-13 LAB — BASIC METABOLIC PANEL
BUN: 14 mg/dL (ref 6–23)
CO2: 30 mEq/L (ref 19–32)
Chloride: 104 mEq/L (ref 96–112)
Glucose, Bld: 86 mg/dL (ref 70–99)
Potassium: 4.2 mEq/L (ref 3.5–5.1)

## 2012-09-13 LAB — HEPATIC FUNCTION PANEL
ALT: 15 U/L (ref 0–35)
Total Bilirubin: 0.7 mg/dL (ref 0.3–1.2)

## 2012-09-13 LAB — TSH: TSH: 1.18 u[IU]/mL (ref 0.35–5.50)

## 2012-09-13 LAB — LIPID PANEL: HDL: 49.1 mg/dL (ref >39.00)

## 2012-09-19 ENCOUNTER — Ambulatory Visit (INDEPENDENT_AMBULATORY_CARE_PROVIDER_SITE_OTHER): Payer: BC Managed Care – PPO | Admitting: Family Medicine

## 2012-09-19 ENCOUNTER — Encounter: Payer: Self-pay | Admitting: Family Medicine

## 2012-09-19 VITALS — BP 130/80 | Temp 98.4°F | Ht 63.25 in | Wt 167.0 lb

## 2012-09-19 DIAGNOSIS — E782 Mixed hyperlipidemia: Secondary | ICD-10-CM

## 2012-09-19 DIAGNOSIS — Z23 Encounter for immunization: Secondary | ICD-10-CM

## 2012-09-19 DIAGNOSIS — F329 Major depressive disorder, single episode, unspecified: Secondary | ICD-10-CM

## 2012-09-19 DIAGNOSIS — J309 Allergic rhinitis, unspecified: Secondary | ICD-10-CM

## 2012-09-19 MED ORDER — CITALOPRAM HYDROBROMIDE 40 MG PO TABS: 40.0000 mg | ORAL_TABLET | ORAL | Status: DC

## 2012-09-19 MED ORDER — SIMVASTATIN 40 MG PO TABS: 40.0000 mg | ORAL_TABLET | Freq: Every day | ORAL | Status: DC

## 2012-09-19 MED ORDER — LORAZEPAM 0.5 MG PO TABS: ORAL_TABLET | ORAL | Status: DC

## 2012-09-19 NOTE — Progress Notes (Signed)
  Subjective:    Patient ID: Tamara Hughes, female    DOB: Oct 19, 1951, 61 y.o.   MRN: [id number]  HPI Tamara Hughes is a 61 year old female who comes in today for general physical examination  She has a history of hyperlipidemia is on Zocor and aspirin  She takes Celexa 40 mg each bedtime for mild depression  Her father was here yesterday he is 79 years old and has metastatic cancer I called hospice he does have a large pleural effusion pulmonary is going to try to drain for comfort care  She gets routine eye care, dental care, and you mammography, recent colonoscopy, does not do BSE monthly. Pap smear last year normal LMP mid-50s. No history of any GYN problems therefore discussed screening pelvic exams and Paps every 3 years  Tetanus 2006 seasonal flu shot today   Review of Systems  Constitutional: Negative.   HENT: Negative.   Eyes: Negative.   Respiratory: Negative.   Cardiovascular: Negative.   Gastrointestinal: Negative.   Genitourinary: Negative.   Musculoskeletal: Negative.   Neurological: Negative.   Hematological: Negative.   Psychiatric/Behavioral: Negative.        Objective:   Physical Exam  Constitutional: She appears well-developed and well-nourished.  HENT:  Head: Normocephalic and atraumatic.  Right Ear: External ear normal.  Left Ear: External ear normal.  Nose: Nose normal.  Mouth/Throat: Oropharynx is clear and moist.  Eyes: EOM are normal. Pupils are equal, round, and reactive to light.  Neck: Normal range of motion. Neck supple. No thyromegaly present.  Cardiovascular: Normal rate, regular rhythm, normal heart sounds and intact distal pulses.  Exam reveals no gallop and no friction rub.   No murmur heard. Pulmonary/Chest: Effort normal and breath sounds normal.  Abdominal: Soft. Bowel sounds are normal. She exhibits no distension and no mass. There is no tenderness. There is no rebound.  Genitourinary:       Bilateral breast exam normal  Musculoskeletal:  Normal range of motion.  Lymphadenopathy:    She has no cervical adenopathy.  Neurological: She is alert. She has normal reflexes. No cranial nerve deficit. She exhibits normal muscle tone. Coordination normal.  Skin: Skin is warm and dry.  Psychiatric: She has a normal mood and affect. Her behavior is normal. Judgment and thought content normal.          Assessment & Plan:  Healthy female  Mild depression continue Celexa Ativan 0.5 each bedtime when necessary  Hyperlipidemia continue Zocor and aspirin  Return in one year sooner if any problems

## 2012-09-19 NOTE — Patient Instructions (Signed)
Continue your current medications  Ativan 0.5,,,,,,,,,,,, one tablet twice daily when necessary  Return in one year for general physical examination  Remember to do a thorough breast exam monthly

## 2012-12-16 ENCOUNTER — Ambulatory Visit (INDEPENDENT_AMBULATORY_CARE_PROVIDER_SITE_OTHER): Payer: BC Managed Care – PPO | Admitting: Family Medicine

## 2012-12-16 ENCOUNTER — Encounter: Payer: Self-pay | Admitting: Family Medicine

## 2012-12-16 VITALS — BP 142/92 | Temp 98.4°F | Wt 169.0 lb

## 2012-12-16 DIAGNOSIS — Z634 Disappearance and death of family member: Secondary | ICD-10-CM

## 2012-12-16 DIAGNOSIS — M79609 Pain in unspecified limb: Secondary | ICD-10-CM

## 2012-12-16 DIAGNOSIS — R030 Elevated blood-pressure reading, without diagnosis of hypertension: Secondary | ICD-10-CM

## 2012-12-16 DIAGNOSIS — M79643 Pain in unspecified hand: Secondary | ICD-10-CM

## 2012-12-16 NOTE — Patient Instructions (Addendum)
-   please see the bereavement counselor  -please follow up with Dr. Tawanna Cooler in one month to recheck your blood pressure and bereavement, stomach - or any concerns

## 2012-12-16 NOTE — Progress Notes (Signed)
Chief Complaint  Patient presents with  . right hand swelling    HPI:  Acute visit R hand discofort: - had felt stiff and swollen this morning - now resolved -raked leaves all day 2 days ago and thinks this is what it was - reports stomach upset on and off since father passed last month - denies weight loss, vomiting, bowel changes, has gas sometimes -also BP up - has been running in 140s/90s at home only since recent loss (before that was always low) -has been very stress  ROS: See pertinent positives and negatives per HPI.  Past Medical History  Diagnosis Date  . Allergy   . Depression   . Hyperlipidemia     Family History  Problem Relation Age of Onset  . Hyperlipidemia Other   . Hypertension Other   . Thyroid disease Other     History   Social History  . Marital Status: Single    Spouse Name: N/A    Number of Children: N/A  . Years of Education: N/A   Social History Main Topics  . Smoking status: Never Smoker   . Smokeless tobacco: None  . Alcohol Use:   . Drug Use:   . Sexually Active:    Other Topics Concern  . None   Social History Narrative  . None    Current outpatient prescriptions:aspirin 81 MG tablet, Take 81 mg by mouth daily.  , Disp: , Rfl: ;  citalopram (CELEXA) 40 MG tablet, Take 1 tablet (40 mg total) by mouth 1 day or 1 dose., Disp: 100 tablet, Rfl: 3;  glucosamine-chondroitin 500-400 MG tablet, Take 1 tablet by mouth daily.  , Disp: , Rfl: ;  LORazepam (ATIVAN) 0.5 MG tablet, One tablet twice daily when necessary, Disp: 30 tablet, Rfl: 2 simvastatin (ZOCOR) 40 MG tablet, Take 1 tablet (40 mg total) by mouth at bedtime., Disp: 100 tablet, Rfl: 3  EXAM:  Filed Vitals:   12/16/12 1444  BP: 142/92  Temp: 98.4 F (36.9 C)    There is no height on file to calculate BMI.  GENERAL: vitals reviewed and listed above, alert, oriented, appears well hydrated and in no acute distress  HEENT: atraumatic, conjunttiva clear, no obvious  abnormalities on inspection of external nose and ears  NECK: no obvious masses on inspection  LUNGS: clear to auscultation bilaterally, no wheezes, rales or rhonchi, good air movement  CV: HRRR, no peripheral edema  MS: moves all extremities without noticeable abnormality - hands are normal in appearance without any swelling, erythema or TTP, nomal raddial pulses  PSYCH: pleasant and cooperative, no obvious depression or anxiety  ASSESSMENT AND PLAN:  Discussed the following assessment and plan:  1. Hand pain   2. Elevated blood pressure   3. Bereavement    -advised hand discomfort may be OA after with raking causing the flare or slept on arm wrong - nothing abnormal on exam today, Tylenol for rare mild symptoms -discussed elevated BP and ? Starting BP meds, pt would like to follow up with PCP to recheck -I think mood and stomach issues likely bereavement, advised bereavement counseling and advised of free places to get this, pt will follow up with PCP in 1 month -Patient advised to return or notify a doctor immediately if symptoms worsen or persist or new concerns arise.  Patient Instructions  - please see the bereavement counselor  -please follow up with Dr. Tawanna Cooler in one month to recheck your blood pressure and bereavement, stomach - or  any concerns      Kriste Basque R.

## 2013-01-13 ENCOUNTER — Encounter: Payer: Self-pay | Admitting: Family Medicine

## 2013-01-13 ENCOUNTER — Ambulatory Visit (INDEPENDENT_AMBULATORY_CARE_PROVIDER_SITE_OTHER): Payer: BC Managed Care – PPO | Admitting: Family Medicine

## 2013-01-13 VITALS — BP 140/80 | Temp 98.9°F | Wt 171.0 lb

## 2013-01-13 DIAGNOSIS — F3289 Other specified depressive episodes: Secondary | ICD-10-CM

## 2013-01-13 DIAGNOSIS — F329 Major depressive disorder, single episode, unspecified: Secondary | ICD-10-CM

## 2013-01-13 DIAGNOSIS — R030 Elevated blood-pressure reading, without diagnosis of hypertension: Secondary | ICD-10-CM

## 2013-01-13 NOTE — Progress Notes (Signed)
  Subjective:    Patient ID: Tamara Hughes, female    DOB: 03/19/1951, 62 y.o.   MRN: [id number]  Tamara Hughes is a 62 year old female nonsmoker who comes in today for followup of hypertension  She was seen in 12/06/2023 after her father died in her blood pressure was elevated. She's been monitoring her blood pressure at home. Today her blood pressure is 140/80.    Review of Systems    review of systems negative Objective:   Physical Exam  Well-developed well-nourished female no acute distress BP right arm sitting position 140/80      Assessment & Plan:  Elevated blood pressure secondary to grief reaction plan reassured BP check weekly at home return in September for annual exam sooner if any problem

## 2013-01-14 ENCOUNTER — Ambulatory Visit: Payer: BC Managed Care – PPO | Admitting: Family Medicine

## 2013-02-13 ENCOUNTER — Ambulatory Visit (INDEPENDENT_AMBULATORY_CARE_PROVIDER_SITE_OTHER): Payer: BC Managed Care – PPO | Admitting: Family Medicine

## 2013-02-13 ENCOUNTER — Encounter: Payer: Self-pay | Admitting: Family Medicine

## 2013-02-13 VITALS — BP 130/80 | Temp 98.9°F | Wt 171.0 lb

## 2013-02-13 DIAGNOSIS — M6740 Ganglion, unspecified site: Secondary | ICD-10-CM

## 2013-02-13 DIAGNOSIS — M67439 Ganglion, unspecified wrist: Secondary | ICD-10-CM | POA: Insufficient documentation

## 2013-02-13 NOTE — Patient Instructions (Signed)
Motrin 600 mg twice daily with food  Elevation ice 15 minutes before bedtime  Short arm splint at bedtime  If after 2-3 weeks the symptoms persist then I would go see Dr. Molly Maduro sypher hand surgeon

## 2013-02-13 NOTE — Progress Notes (Signed)
  Subjective:    Patient ID: Tamara Hughes, female    DOB: 1951/10/06, 62 y.o.   MRN: [id number]  HPI Tamara Hughes is a 62 year old female nonsmoker who comes in today for evaluation of pain in her right hand for 2 weeks  No history of trauma   Review of Systems Review of systems otherwise negative    Objective:   Physical Exam  Well-developed well-nourished female no acute distress examination right hand shows a ganglion cyst medial ventral area the rest      Assessment & Plan:  Ganglion cyst left wrist plan Motrin splint hand surgery consult when necessary

## 2013-07-07 ENCOUNTER — Other Ambulatory Visit: Payer: Self-pay

## 2013-07-07 DIAGNOSIS — Z1231 Encounter for screening mammogram for malignant neoplasm of breast: Secondary | ICD-10-CM

## 2013-08-13 ENCOUNTER — Ambulatory Visit
Admission: RE | Admit: 2013-08-13 | Discharge: 2013-08-13 | Disposition: A | Discharge status: Home / Self Care | Payer: BC Managed Care – PPO | Source: Ambulatory Visit

## 2013-08-13 DIAGNOSIS — Z1231 Encounter for screening mammogram for malignant neoplasm of breast: Secondary | ICD-10-CM

## 2013-09-16 ENCOUNTER — Other Ambulatory Visit (INDEPENDENT_AMBULATORY_CARE_PROVIDER_SITE_OTHER): Payer: BC Managed Care – PPO

## 2013-09-16 DIAGNOSIS — Z0000 Encounter for general adult medical examination without abnormal findings: Secondary | ICD-10-CM

## 2013-09-16 LAB — CBC WITH DIFFERENTIAL/PLATELET
Basophils Absolute: 0.1 10*3/uL (ref 0.0–0.1)
Eosinophils Absolute: 0.2 10*3/uL (ref 0.0–0.7)
HCT: 39.2 % (ref 36.0–46.0)
Hemoglobin: 12.8 g/dL (ref 12.0–15.0)
Lymphs Abs: 1.1 10*3/uL (ref 0.7–4.0)
MCHC: 32.6 g/dL (ref 30.0–36.0)
MCV: 82.1 fl (ref 78.0–100.0)
Neutro Abs: 3.5 10*3/uL (ref 1.4–7.7)
RDW: 14.4 % (ref 11.5–14.6)

## 2013-09-16 LAB — BASIC METABOLIC PANEL
CO2: 30 mEq/L (ref 19–32)
Chloride: 108 mEq/L (ref 96–112)
Glucose, Bld: 83 mg/dL (ref 70–99)
Potassium: 4.1 mEq/L (ref 3.5–5.1)
Sodium: 143 mEq/L (ref 135–145)

## 2013-09-16 LAB — HEPATIC FUNCTION PANEL
Albumin: 3.9 g/dL (ref 3.5–5.2)
Total Protein: 6.9 g/dL (ref 6.0–8.3)

## 2013-09-16 LAB — POCT URINALYSIS DIPSTICK
Glucose, UA: NEGATIVE
Nitrite, UA: NEGATIVE
Protein, UA: NEGATIVE
Urobilinogen, UA: 0.2

## 2013-09-16 LAB — LIPID PANEL
Total CHOL/HDL Ratio: 3
Triglycerides: 104 mg/dL (ref 0.0–149.0)

## 2013-09-16 LAB — TSH: TSH: 0.78 u[IU]/mL (ref 0.35–5.50)

## 2013-09-23 ENCOUNTER — Other Ambulatory Visit (HOSPITAL_COMMUNITY)
Admission: RE | Admit: 2013-09-23 | Discharge: 2013-09-23 | Disposition: A | Discharge status: Home / Self Care | Payer: BC Managed Care – PPO | Source: Ambulatory Visit | Attending: Family | Admitting: Family

## 2013-09-23 ENCOUNTER — Encounter: Payer: BC Managed Care – PPO | Admitting: Family Medicine

## 2013-09-23 ENCOUNTER — Ambulatory Visit (INDEPENDENT_AMBULATORY_CARE_PROVIDER_SITE_OTHER): Payer: BC Managed Care – PPO | Admitting: Family

## 2013-09-23 ENCOUNTER — Encounter: Payer: Self-pay | Admitting: Family

## 2013-09-23 VITALS — BP 142/80 | HR 68 | Ht 63.75 in | Wt 168.0 lb

## 2013-09-23 DIAGNOSIS — F329 Major depressive disorder, single episode, unspecified: Secondary | ICD-10-CM

## 2013-09-23 DIAGNOSIS — Z124 Encounter for screening for malignant neoplasm of cervix: Secondary | ICD-10-CM

## 2013-09-23 DIAGNOSIS — E7800 Pure hypercholesterolemia, unspecified: Secondary | ICD-10-CM

## 2013-09-23 DIAGNOSIS — Z0000 Encounter for general adult medical examination without abnormal findings: Secondary | ICD-10-CM

## 2013-09-23 DIAGNOSIS — E782 Mixed hyperlipidemia: Secondary | ICD-10-CM

## 2013-09-23 DIAGNOSIS — Z01419 Encounter for gynecological examination (general) (routine) without abnormal findings: Secondary | ICD-10-CM | POA: Insufficient documentation

## 2013-09-23 MED ORDER — SIMVASTATIN 40 MG PO TABS: 40.0000 mg | ORAL_TABLET | Freq: Every day | ORAL | Status: DC

## 2013-09-23 MED ORDER — LORAZEPAM 0.5 MG PO TABS: ORAL_TABLET | ORAL | Status: DC

## 2013-09-23 MED ORDER — CITALOPRAM HYDROBROMIDE 40 MG PO TABS: 40.0000 mg | ORAL_TABLET | ORAL | Status: DC

## 2013-09-23 NOTE — Progress Notes (Signed)
  Subjective:    Patient ID: Tamara Hughes, female    DOB: May 22, 1951, 62 y.o.   MRN: [id number]  HPI 62 year old routine physical examination for this healthy  Female. Reviewed all health maintenance protocols including mammography colonoscopy bone density and reviewed appropriate screening labs. Her immunization history was reviewed as well as her current medications and allergies refills of her chronic medications were given and the plan for yearly health maintenance was discussed all orders and referrals were made as appropriate.   Review of Systems  Constitutional: Negative.   HENT: Negative.   Eyes: Negative.   Respiratory: Negative.   Cardiovascular: Negative.   Gastrointestinal: Negative.   Endocrine: Negative.   Genitourinary: Negative.   Musculoskeletal: Negative.   Skin: Negative.   Allergic/Immunologic: Negative.   Neurological: Negative.   Hematological: Negative.   Psychiatric/Behavioral: Negative.        Objective:   Physical Exam  Constitutional: She is oriented to person, place, and time. She appears well-developed and well-nourished.  HENT:  Head: Normocephalic and atraumatic.  Right Ear: External ear normal.  Left Ear: External ear normal.  Nose: Nose normal.  Mouth/Throat: Oropharynx is clear and moist.  Eyes: Conjunctivae and EOM are normal. Pupils are equal, round, and reactive to light.  Neck: Normal range of motion. Neck supple.  Cardiovascular: Normal rate, regular rhythm and normal heart sounds.   Pulmonary/Chest: Effort normal and breath sounds normal.  Abdominal: Soft. Bowel sounds are normal.  Genitourinary: Vagina normal and uterus normal. Guaiac negative stool. No vaginal discharge found.  Musculoskeletal: Normal range of motion. She exhibits no edema and no tenderness.  Neurological: She is alert and oriented to person, place, and time. She has normal reflexes. She displays normal reflexes. No cranial nerve deficit. Coordination normal.  Skin:  Skin is warm and dry.  Psychiatric: She has a normal mood and affect.          Assessment & Plan:  Assessments: 1. CPX 2. Hyperlipidemia 3. Depression 4. Allergic Rhinitis

## 2013-09-23 NOTE — Patient Instructions (Addendum)
Stress Management Stress is a state of physical or mental tension that often results from changes in your life or normal routine. Some common causes of stress are:  Death of a loved one.  Injuries or severe illnesses.  Getting fired or changing jobs.  Moving into a new home. Other causes may be:  Sexual problems.  Business or financial losses.  Taking on a large debt.  Regular conflict with someone at home or at work.  Constant tiredness from lack of sleep. It is not just bad things that are stressful. It may be stressful to:  Win the lottery.  Get married.  Buy a new car. The amount of stress that can be easily tolerated varies from person to person. Changes generally cause stress, regardless of the types of change. Too much stress can affect your health. It may lead to physical or emotional problems. Too little stress (boredom) may also become stressful. SUGGESTIONS TO REDUCE STRESS:  Talk things over with your family and friends. It often is helpful to share your concerns and worries. If you feel your problem is serious, you may want to get help from a professional counselor.  Consider your problems one at a time instead of lumping them all together. Trying to take care of everything at once may seem impossible. List all the things you need to do and then start with the most important one. Set a goal to accomplish 2 or 3 things each day. If you expect to do too many in a single day you will naturally fail, causing you to feel even more stressed.  Do not use alcohol or drugs to relieve stress. Although you may feel better for a short time, they do not remove the problems that caused the stress. They can also be habit forming.  Exercise regularly - at least 3 times per week. Physical exercise can help to relieve that "uptight" feeling and will relax you.  The shortest distance between despair and hope is often a good night's sleep.  Go to bed and get up on time allowing  yourself time for appointments without being rushed.  Take a short "time-out" period from any stressful situation that occurs during the day. Close your eyes and take some deep breaths. Starting with the muscles in your face, tense them, hold it for a few seconds, then relax. Repeat this with the muscles in your neck, shoulders, hand, stomach, back and legs.  Take good care of yourself. Eat a balanced diet and get plenty of rest.  Schedule time for having fun. Take a break from your daily routine to relax. HOME CARE INSTRUCTIONS   Call if you feel overwhelmed by your problems and feel you can no longer manage them on your own.  Return immediately if you feel like hurting yourself or someone else. Document Released: 06/06/2001 Document Revised: 03/04/2012 Document Reviewed: 01/27/2008 [employer] Patient Information 2014 [employer].  

## 2013-10-09 ENCOUNTER — Telehealth: Payer: Self-pay | Admitting: Family Medicine

## 2013-10-09 ENCOUNTER — Emergency Department (HOSPITAL_COMMUNITY)
Admission: EM | Admit: 2013-10-09 | Discharge: 2013-10-09 | Disposition: A | Discharge status: Home / Self Care | Payer: BC Managed Care – PPO | Attending: Emergency Medicine | Admitting: Emergency Medicine

## 2013-10-09 ENCOUNTER — Encounter (HOSPITAL_COMMUNITY): Payer: Self-pay | Admitting: Emergency Medicine

## 2013-10-09 DIAGNOSIS — F329 Major depressive disorder, single episode, unspecified: Secondary | ICD-10-CM | POA: Insufficient documentation

## 2013-10-09 DIAGNOSIS — I10 Essential (primary) hypertension: Secondary | ICD-10-CM

## 2013-10-09 DIAGNOSIS — Z880 Allergy status to penicillin: Secondary | ICD-10-CM | POA: Insufficient documentation

## 2013-10-09 DIAGNOSIS — R0789 Other chest pain: Secondary | ICD-10-CM | POA: Insufficient documentation

## 2013-10-09 DIAGNOSIS — Z7982 Long term (current) use of aspirin: Secondary | ICD-10-CM | POA: Insufficient documentation

## 2013-10-09 DIAGNOSIS — F3289 Other specified depressive episodes: Secondary | ICD-10-CM | POA: Insufficient documentation

## 2013-10-09 DIAGNOSIS — E785 Hyperlipidemia, unspecified: Secondary | ICD-10-CM | POA: Insufficient documentation

## 2013-10-09 NOTE — ED Notes (Signed)
Pt checks her own BP every am.  This am it was elevated when she checked it, it was 175/100.  Pt denies any other symptoms with this at the time.  Pt is not on BP meds.  Pt states that she has "some chest tightness" on arrival, no other symptoms, no sob.

## 2013-10-09 NOTE — Telephone Encounter (Signed)
Pt is calling to inquire about her BP, and which possible medications he would like her to take. She states that she went to the ER this morning, but that that they did not place her on any medications. She is bringing her mother to an appointment this afternoon at 4pm. Please advise.

## 2013-10-09 NOTE — Telephone Encounter (Signed)
Patient Information:  Caller Name: Paisli  Phone: [phone]  Patient: Tamara Hughes, Tamara Hughes  Gender: Female  DOB: 06-15-1951  Age: 62 Years  PCP: Kelle Darting [facility])  Office Follow Up:  Does the office need to follow up with this patient?: No  Instructions For The Office: N/A  RN Note:  Agreed to go to [facility] ED now. Recommended to ask coworker to drive her now.  Symptoms  Reason For Call & Symptoms: BP 175/100 and constant substernal chest "discomfort" rated 3/10 for several days.  BP is higher today than it has been.  Initially refused CSR recommendation to call 911. At work,  Denies shortness of breath, nausea or diaphoresis.  Reviewed Health History In EMR: N/A  Reviewed Medications In EMR: N/A  Reviewed Allergies In EMR: N/A  Reviewed Surgeries / Procedures: N/A  Date of Onset of Symptoms: 10/09/2013  Guideline(s) Used:  High Blood Pressure  Disposition Per Guideline:   Go to ED Now (or to Office with PCP Approval)  Reason For Disposition Reached:   BP > 160 / 100 and any cardiac or neurologic symptoms (e.g., chest pain, difficulty breathing, unsteady gait, blurred vision)  Advice Given:  N/A  RN Overrode Recommendation:  Go To ED  [facility] Gainer

## 2013-10-09 NOTE — ED Notes (Addendum)
Pt to ED for evaluation of high blood pressure reading at home this morning- denies taking medications for hypertension- denies headache or blurred vision.  Pt denies nay complaints at this time.

## 2013-10-09 NOTE — Telephone Encounter (Signed)
Patient came in with her mother today.  I checked her blood pressure it was 140/90.  I offered an appointment tomorrow with a provider.  Patient declined.  Informed patient to keep a record of her blood pressure and call back next week if she would like an office visit.

## 2013-10-09 NOTE — ED Provider Notes (Signed)
CSN: [id number]     Arrival date & time 10/09/13  0909 History   First MD Initiated Contact with Patient 10/09/13 0930     Chief Complaint  Patient presents with  . Hypertension    Pt seen with medical student, I performed history/physical/documentation   Patient is a 62 y.o. female presenting with hypertension. The history is provided by the patient.  Hypertension This is a new problem. Episode onset: today. The problem occurs constantly. The problem has been gradually improving. Pertinent negatives include no headaches and no shortness of breath. Nothing aggravates the symptoms. Nothing relieves the symptoms.   Pt presents for evaluation for HTN She reports her BP is usually lower She reports mild chest tightness for at least a week - denies radiation of pain, denies exertional CP No SOB No focal weakness No severe HA or visual changes noted She called her PCP who directed her to the ED  Past Medical History  Diagnosis Date  . Allergy   . Depression   . Hyperlipidemia    History reviewed. No pertinent past surgical history. Family History  Problem Relation Age of Onset  . Hyperlipidemia Other   . Hypertension Other   . Thyroid disease Other    History  Substance Use Topics  . Smoking status: Never Smoker   . Smokeless tobacco: Not on file  . Alcohol Use:    OB History   Grav Para Term Preterm Abortions TAB SAB Ect Mult Living                 Review of Systems  Constitutional: Negative for fever.  Eyes: Negative for visual disturbance.  Respiratory: Negative for shortness of breath.   Gastrointestinal: Negative for vomiting.  Neurological: Negative for weakness and headaches.  All other systems reviewed and are negative.    Allergies  Doxycycline; Indomethacin; and Penicillins  Home Medications   Current Outpatient Rx  Name  Route  Sig  Dispense  Refill  . aspirin 81 MG tablet   Oral   Take 81 mg by mouth daily.           . citalopram (CELEXA) 40  MG tablet   Oral   Take 1 tablet (40 mg total) by mouth 1 day or 1 dose.   100 tablet   3   . LORazepam (ATIVAN) 0.5 MG tablet   Oral   Take 0.5 mg by mouth 2 (two) times daily as needed for anxiety.         [name] OVER THE COUNTER MEDICATION   Both Eyes   Place 2 drops into both eyes daily as needed (dry eyes).         . simvastatin (ZOCOR) 40 MG tablet   Oral   Take 1 tablet (40 mg total) by mouth at bedtime.   100 tablet   3    BP 153/75  Pulse 68  Temp(Src) 98.7 F (37.1 C) (Oral)  SpO2 100% Physical Exam CONSTITUTIONAL: Well developed/well nourished, pt is watching TV, no distress HEAD: Normocephalic/atraumatic EYES: EOMI/PERRL ENMT: Mucous membranes moist NECK: supple no meningeal signs SPINE:entire spine nontender CV: S1/S2 noted, no murmurs/rubs/gallops noted LUNGS: Lungs are clear to auscultation bilaterally, no apparent distress ABDOMEN: soft, nontender, no rebound or guarding GU:no cva tenderness NEURO: Pt is awake/alert, moves all extremitiesx4, no facial droop.  No arm/leg drift.   EXTREMITIES: pulses normal, full ROM SKIN: warm, color normal PSYCH: no abnormalities of mood noted  ED Course  Procedures   EKG  Interpretation   None      Nursing notes from PCP office reviewed.  Pt tells me Chest tightness for at least a week and not related to today's episode, I doubt ACS/PE/Dissection at this time No signs of hypertensive emergency She is watching TV and in no distress Advised close PCP Followup.  Do not feel emergent therapy for HTN is required at this time  MDM  No diagnosis found. Nursing notes including past medical history and social history reviewed and considered in documentation Previous records reviewed and considered - nursing notes reviewed    Date: 10/09/2013  Rate: 66  Rhythm: normal sinus rhythm  QRS Axis: normal  Intervals: normal  ST/T Wave abnormalities: nonspecific ST changes  Conduction Disutrbances:none  Narrative  Interpretation:   Old EKG Reviewed: unchanged from 08/12    Joya Gaskins, MD 10/09/13 1047

## 2013-10-11 ENCOUNTER — Ambulatory Visit (INDEPENDENT_AMBULATORY_CARE_PROVIDER_SITE_OTHER): Payer: BC Managed Care – PPO | Admitting: Family Medicine

## 2013-10-11 ENCOUNTER — Encounter: Payer: Self-pay | Admitting: Family Medicine

## 2013-10-11 VITALS — BP 140/80 | Temp 99.2°F | Wt 168.0 lb

## 2013-10-11 DIAGNOSIS — I10 Essential (primary) hypertension: Secondary | ICD-10-CM | POA: Insufficient documentation

## 2013-10-11 MED ORDER — LISINOPRIL-HYDROCHLOROTHIAZIDE 10-12.5 MG PO TABS: 1.0000 | ORAL_TABLET | Freq: Every day | ORAL | Status: DC

## 2013-10-11 NOTE — Patient Instructions (Signed)
Begin the Zestoretic,,,,,,,, one tablet daily in the morning  Check your blood pressure daily in the morning  Return to the office next Thursday for followup with all your blood pressure readings and the device

## 2013-10-11 NOTE — Progress Notes (Signed)
  Subjective:    Patient ID: Tamara Hughes, female    DOB: 1951/11/14, 62 y.o.   MRN: [id number]  HPI Tamara Hughes is a 62 year old female who comes in today for evaluation of elevated blood pressure  Over the last couple weeks she's noticed her blood pressures been going up. She went to the emergency room at one time because her pressure was so high.  Positive family history of hypertension  She does have underlying hyperlipidemia   Review of Systems Review of systems otherwise negative    Objective:   Physical Exam wwell-developed well-nourished female no acute distress the blood pressure in the right arm sitting position was 160/90 pulse 70 and regular       Assessment & Plan:  New-onset hypertension,,,,,,,,,,, start ACE inhibitor followup in the office in one week

## 2013-10-16 ENCOUNTER — Ambulatory Visit (INDEPENDENT_AMBULATORY_CARE_PROVIDER_SITE_OTHER): Payer: BC Managed Care – PPO | Admitting: Family Medicine

## 2013-10-16 ENCOUNTER — Encounter: Payer: Self-pay | Admitting: Family Medicine

## 2013-10-16 VITALS — BP 120/80 | Temp 98.3°F | Wt 168.0 lb

## 2013-10-16 DIAGNOSIS — I10 Essential (primary) hypertension: Secondary | ICD-10-CM

## 2013-10-16 DIAGNOSIS — J309 Allergic rhinitis, unspecified: Secondary | ICD-10-CM

## 2013-10-16 LAB — BASIC METABOLIC PANEL
CO2: 30 mEq/L (ref 19–32)
Chloride: 102 mEq/L (ref 96–112)
Creatinine, Ser: 0.7 mg/dL (ref 0.4–1.2)
Potassium: 4 mEq/L (ref 3.5–5.1)

## 2013-10-16 MED ORDER — FLUTICASONE PROPIONATE 50 MCG/ACT NA SUSP: 2.0000 | Freq: Every day | NASAL | Status: DC

## 2013-10-16 NOTE — Patient Instructions (Signed)
Continue the blood pressure medicine one tablet daily  Check your blood pressure weekly since its normal  Goal is 135/85 or less  If she get an elevated reading been check your blood pressure daily for 2-3 weeks on arrival to see what the trend is  If you see a 2 to three-week current weather all elevated then call and return for followup so we can adjust her medication  Labs today  Steroid nasal spray one shot each nostril nightly at bedtime  You can take an antihistamine for your allergies but nothing with Sudafed

## 2013-10-16 NOTE — Progress Notes (Signed)
  Subjective:    Patient ID: Tamara Hughes, female    DOB: [birth year], 62 y.o.   MRN: [id number]  HPI Tamara Hughes is a 62 year old female who comes in today for evaluation of hypertension allergic rhinitis  She is on Zestoretic 10-12.5 daily BP 120/80 no side effects no cough no hives  She is allergic rhinitis is taking Claritin but she's had a lot of nasal edema and congestion    Review of Systems Review of systems negative    Objective:   Physical Exam Well-developed well-nourished female no acute distress BP right arm sitting position 120/80 pulse 70 and regular  Septum in the midline 3-4+ nasal edema from allergic rhinitis       Assessment & Plan:  Hypertension at goal continue current therapy  Allergic rhinitis at steroid nasal spray

## 2014-03-11 ENCOUNTER — Ambulatory Visit (INDEPENDENT_AMBULATORY_CARE_PROVIDER_SITE_OTHER)
Admission: RE | Admit: 2014-03-11 | Discharge: 2014-03-11 | Disposition: A | Discharge status: Home / Self Care | Payer: BC Managed Care – PPO | Source: Ambulatory Visit | Attending: Family Medicine | Admitting: Family Medicine

## 2014-03-11 ENCOUNTER — Encounter: Payer: Self-pay | Admitting: Family Medicine

## 2014-03-11 ENCOUNTER — Ambulatory Visit (INDEPENDENT_AMBULATORY_CARE_PROVIDER_SITE_OTHER): Payer: BC Managed Care – PPO | Admitting: Family Medicine

## 2014-03-11 VITALS — BP 120/80 | Temp 98.3°F | Wt 171.0 lb

## 2014-03-11 DIAGNOSIS — M79609 Pain in unspecified limb: Secondary | ICD-10-CM

## 2014-03-11 DIAGNOSIS — M79604 Pain in right leg: Secondary | ICD-10-CM

## 2014-03-11 DIAGNOSIS — J309 Allergic rhinitis, unspecified: Secondary | ICD-10-CM

## 2014-03-11 NOTE — Patient Instructions (Signed)
X-ray of your right knee today  Claritin in the morning along with one spray of steroid nasal spray up each nostril. If in a week you're still symptomatic take a plane Zyrtec at bedtime in addition to the above

## 2014-03-11 NOTE — Progress Notes (Signed)
   Subjective:    Patient ID: Tamara CanavanJean E Quintela, female    DOB: Dec 11, 1951, 63 y.o.   MRN: [id number]  HPI Carney BernJean is a 63 year old female who comes in today for evaluation of 2 problems  Having pain in the posterior portion of her right knee for about 3 weeks. A week prior to the discomfort she fell at home. It had no pain immediately. There may problems that right knee in the past.  Center with her allergies. She takes plain Claritin once a day and just recently restarted steroid nasal spray   Review of Systems Review of systems negative    Objective:   Physical Exam  Well-developed well-nourished female no acute distress vital signs stable she is afebrile examination of the right leg shows the leg to be normal in appearance knee is normal no tenderness in the calf. There is slight tenderness in the popliteal fossa  Left leg normal  Inch exam shows marked nasal edema slight nasal perforation      Assessment & Plan:  Pain behind right knee question popliteal cyst x-ray today  Allergic rhinitis Claritin and steroid nasal spray

## 2014-05-25 ENCOUNTER — Ambulatory Visit (INDEPENDENT_AMBULATORY_CARE_PROVIDER_SITE_OTHER): Payer: BC Managed Care – PPO | Admitting: Family Medicine

## 2014-05-25 ENCOUNTER — Encounter: Payer: Self-pay | Admitting: Family Medicine

## 2014-05-25 VITALS — BP 120/80 | Temp 98.6°F | Wt 174.0 lb

## 2014-05-25 DIAGNOSIS — J309 Allergic rhinitis, unspecified: Secondary | ICD-10-CM

## 2014-05-25 NOTE — Patient Instructions (Signed)
Claritin one at bedtime  Afrin nasal spray.........[name] 1 shot up each nostril at bedtime.......... then used the steroid nasal spray  Stop the Afrin after 5 nights but continue the steroid nasal spray until your ear clears up.

## 2014-05-25 NOTE — Progress Notes (Signed)
Pre visit review using our clinic review tool, if applicable. No additional management support is needed unless otherwise documented below in the visit note. 

## 2014-05-25 NOTE — Progress Notes (Signed)
   Subjective:    Patient ID: Tamara Hughes, female    DOB: 1951-01-23, 63 y.o.   MRN: [id number]  HPI Tamara Hughes is a 63 year old female who comes in today for evaluation of decreased hearing in her left ear  She has a history of underlying allergic rhinitis but says neurology problems are minimal however the last 2 weeks she's not been able to hear out of her left ear. No fever chills no tinnitus   Review of Systems Review of systems otherwise negative no history of trauma    Objective:   Physical Exam  Well-developed and nourished female no acute distress vital signs stable she is afebrile ENT exam pertinent she has fluid in her left ear      Assessment & Plan:  Serous otitis media left ear,,,,,,,,, treat symptomatically

## 2014-07-23 ENCOUNTER — Other Ambulatory Visit: Payer: Self-pay

## 2014-07-23 DIAGNOSIS — Z1231 Encounter for screening mammogram for malignant neoplasm of breast: Secondary | ICD-10-CM

## 2014-07-28 ENCOUNTER — Other Ambulatory Visit: Payer: Self-pay | Admitting: Family

## 2014-08-14 ENCOUNTER — Ambulatory Visit
Admission: RE | Admit: 2014-08-14 | Discharge: 2014-08-14 | Disposition: A | Discharge status: Home / Self Care | Payer: BC Managed Care – PPO | Source: Ambulatory Visit

## 2014-08-14 DIAGNOSIS — Z1231 Encounter for screening mammogram for malignant neoplasm of breast: Secondary | ICD-10-CM

## 2014-09-17 ENCOUNTER — Other Ambulatory Visit (INDEPENDENT_AMBULATORY_CARE_PROVIDER_SITE_OTHER): Payer: Medicare Other

## 2014-09-17 DIAGNOSIS — Z0000 Encounter for general adult medical examination without abnormal findings: Secondary | ICD-10-CM

## 2014-09-17 DIAGNOSIS — E782 Mixed hyperlipidemia: Secondary | ICD-10-CM

## 2014-09-17 LAB — CBC WITH DIFFERENTIAL/PLATELET
BASOS ABS: 0.1 10*3/uL (ref 0.0–0.1)
Basophils Relative: 0.9 % (ref 0.0–3.0)
EOS ABS: 0.2 10*3/uL (ref 0.0–0.7)
Eosinophils Relative: 3 % (ref 0.0–5.0)
HEMATOCRIT: 41.3 % (ref 36.0–46.0)
Hemoglobin: 13.1 g/dL (ref 12.0–15.0)
LYMPHS ABS: 1.2 10*3/uL (ref 0.7–4.0)
Lymphocytes Relative: 20.3 % (ref 12.0–46.0)
MCHC: 31.8 g/dL (ref 30.0–36.0)
MCV: 83 fl (ref 78.0–100.0)
MONO ABS: 0.6 10*3/uL (ref 0.1–1.0)
Monocytes Relative: 9.4 % (ref 3.0–12.0)
Neutro Abs: 4 10*3/uL (ref 1.4–7.7)
Neutrophils Relative %: 66.4 % (ref 43.0–77.0)
PLATELETS: 277 10*3/uL (ref 150.0–400.0)
RBC: 4.98 Mil/uL (ref 3.87–5.11)
RDW: 15.2 % (ref 11.5–15.5)
WBC: 6.1 10*3/uL (ref 4.0–10.5)

## 2014-09-17 LAB — POCT URINALYSIS DIPSTICK
Bilirubin, UA: NEGATIVE
Glucose, UA: NEGATIVE
Ketones, UA: NEGATIVE
Leukocytes, UA: NEGATIVE
Nitrite, UA: NEGATIVE
PROTEIN UA: NEGATIVE
SPEC GRAV UA: 1.01
UROBILINOGEN UA: 0.2
pH, UA: 7

## 2014-09-17 LAB — LIPID PANEL
Cholesterol: 199 mg/dL (ref 0–200)
HDL: 59 mg/dL (ref >39.00)
LDL Cholesterol: 117 mg/dL — ABNORMAL HIGH (ref 0–99)
NONHDL: 140
Total CHOL/HDL Ratio: 3
Triglycerides: 116 mg/dL (ref 0.0–149.0)
VLDL: 23.2 mg/dL (ref 0.0–40.0)

## 2014-09-17 LAB — BASIC METABOLIC PANEL
BUN: 10 mg/dL (ref 6–23)
CALCIUM: 9.1 mg/dL (ref 8.4–10.5)
CO2: 31 meq/L (ref 19–32)
CREATININE: 0.7 mg/dL (ref 0.4–1.2)
Chloride: 104 mEq/L (ref 96–112)
GFR: 115.59 mL/min (ref >60.00)
Glucose, Bld: 79 mg/dL (ref 70–99)
Potassium: 4.3 mEq/L (ref 3.5–5.1)
Sodium: 141 mEq/L (ref 135–145)

## 2014-09-17 LAB — HEPATIC FUNCTION PANEL
ALK PHOS: 63 U/L (ref 39–117)
ALT: 17 U/L (ref 0–35)
AST: 21 U/L (ref 0–37)
Albumin: 3.9 g/dL (ref 3.5–5.2)
BILIRUBIN TOTAL: 0.5 mg/dL (ref 0.2–1.2)
Bilirubin, Direct: 0.1 mg/dL (ref 0.0–0.3)
Total Protein: 7 g/dL (ref 6.0–8.3)

## 2014-09-17 LAB — TSH: TSH: 0.7 u[IU]/mL (ref 0.35–4.50)

## 2014-09-24 ENCOUNTER — Encounter: Payer: Self-pay | Admitting: Family Medicine

## 2014-09-24 ENCOUNTER — Ambulatory Visit (INDEPENDENT_AMBULATORY_CARE_PROVIDER_SITE_OTHER): Payer: Medicare Other | Admitting: Family Medicine

## 2014-09-24 VITALS — BP 140/90 | Temp 98.3°F | Ht 63.5 in | Wt 168.0 lb

## 2014-09-24 DIAGNOSIS — J3089 Other allergic rhinitis: Secondary | ICD-10-CM

## 2014-09-24 DIAGNOSIS — Z23 Encounter for immunization: Secondary | ICD-10-CM

## 2014-09-24 DIAGNOSIS — F32A Depression, unspecified: Secondary | ICD-10-CM

## 2014-09-24 DIAGNOSIS — E782 Mixed hyperlipidemia: Secondary | ICD-10-CM

## 2014-09-24 DIAGNOSIS — F329 Major depressive disorder, single episode, unspecified: Secondary | ICD-10-CM

## 2014-09-24 MED ORDER — FLUTICASONE PROPIONATE 50 MCG/ACT NA SUSP: 2.0000 | Freq: Every day | NASAL | Status: DC

## 2014-09-24 MED ORDER — LORAZEPAM 0.5 MG PO TABS: ORAL_TABLET | ORAL | Status: DC

## 2014-09-24 MED ORDER — CITALOPRAM HYDROBROMIDE 40 MG PO TABS: 40.0000 mg | ORAL_TABLET | ORAL | Status: DC

## 2014-09-24 NOTE — Progress Notes (Signed)
Pre visit review using our clinic review tool, if applicable. No additional management support is needed unless otherwise documented below in the visit note. 

## 2014-09-24 NOTE — Progress Notes (Signed)
   Subjective:    Patient ID: Tamara Hughes, female    DOB: 17-Mar-1951, 63 y.o.   MRN: [id number]  HPI Tamara Hughes is a 63 year old female nonsmoker who comes in today for evaluation of hypertension, depression, allergic rhinitis, hyperlipidemia, mild anxiety  She stopped her Zestoretic because she changed her diet and begin a [facility]. She states [facility] was 124/87 off medication  She takes Celexa 40 mg daily and Ativan 0.5 twice a day when necessary for anxiety. Her son is still in jail  Uses steroid nasal spray for allergic rhinitis, Zocor 40 mg daily for hyperlipidemia along with an aspirin tablet  She gets routine eye care, dental care, does not do BSE monthly, and you mammography normal August, colonoscopy x2 both normal.  Social history now that she is retired she [facility] [employer] [facility]. She tells who takes care of her ailing mother is 6590+ years old her father died 2 years ago  She had a last period at age 63. Past her last year was normal asymptomatic therefore every 3 years on her Paps. Bilateral breast exam normal   Review of Systems  Constitutional: Negative.   HENT: Negative.   Eyes: Negative.   Respiratory: Negative.   Cardiovascular: Negative.   Gastrointestinal: Negative.   Genitourinary: Negative.   Musculoskeletal: Negative.   Neurological: Negative.   Psychiatric/Behavioral: Negative.        Objective:   Physical Exam  Constitutional: She appears well-developed and well-nourished.  HENT:  Head: Normocephalic and atraumatic.  Right Ear: External ear normal.  Left Ear: External ear normal.  Nose: Nose normal.  Mouth/Throat: Oropharynx is clear and moist.  Eyes: EOM are normal. Pupils are equal, round, and reactive to light.  Neck: Normal range of motion. Neck supple. No thyromegaly present.  Cardiovascular: Normal rate, regular rhythm, normal heart sounds and intact distal pulses.  Exam reveals no gallop  and no friction rub.   No murmur heard. Pulmonary/Chest: Effort normal and breath sounds normal.  Abdominal: Soft. Bowel sounds are normal. She exhibits no distension and no mass. There is no tenderness. There is no rebound.  Genitourinary: Vagina normal and uterus normal. Guaiac negative stool. No vaginal discharge found.  Musculoskeletal: Normal range of motion.  Lymphadenopathy:    She has no cervical adenopathy.  Neurological: She is alert. She has normal reflexes. No cranial nerve deficit. She exhibits normal muscle tone. Coordination normal.  Skin: Skin is warm and dry.  Psychiatric: She has a normal mood and affect. Her behavior is normal. Judgment and thought content normal.          Assessment & Plan:  Healthy female  History of hypertension off medication blood pressure normal with daily exercise........[name]. monitor blood pressure every morning x1 month faxed data  Hyperlipidemia........ patient wishes to stop her Zocor.......... fasting lipid panel in 2 months  History depression continue Celexa  History of anxiety continue Ativan 0.5 twice a day when necessary  Allergic rhinitis continue steroid nasal spray and antihistamine.

## 2014-09-24 NOTE — Patient Instructions (Addendum)
Continue current medications  Followup in 1 year sooner if any problem  Return in 2 months for a fasting lipid panel............. stop the Zocor   blood pressure checked daily at home..........[name]. fax me the data at [phone] in one month

## 2014-10-29 ENCOUNTER — Other Ambulatory Visit: Payer: Self-pay | Admitting: Orthopedic Surgery

## 2014-11-10 ENCOUNTER — Encounter (HOSPITAL_BASED_OUTPATIENT_CLINIC_OR_DEPARTMENT_OTHER): Admission: RE | Payer: Self-pay | Source: Ambulatory Visit

## 2014-11-10 ENCOUNTER — Ambulatory Visit (HOSPITAL_BASED_OUTPATIENT_CLINIC_OR_DEPARTMENT_OTHER): Admission: RE | Admit: 2014-11-10 | Payer: Medicare Other | Source: Ambulatory Visit | Admitting: Orthopedic Surgery

## 2014-11-10 SURGERY — EXCISION MASS
Anesthesia: Choice | Laterality: Right

## 2014-11-24 ENCOUNTER — Other Ambulatory Visit (INDEPENDENT_AMBULATORY_CARE_PROVIDER_SITE_OTHER): Payer: Medicare Other

## 2014-11-24 DIAGNOSIS — E782 Mixed hyperlipidemia: Secondary | ICD-10-CM

## 2014-11-24 LAB — LIPID PANEL
CHOL/HDL RATIO: 6
CHOLESTEROL: 282 mg/dL — AB (ref 0–200)
HDL: 43.6 mg/dL (ref >39.00)
LDL Cholesterol: 211 mg/dL — ABNORMAL HIGH (ref 0–99)
NonHDL: 238.4
TRIGLYCERIDES: 138 mg/dL (ref 0.0–149.0)
VLDL: 27.6 mg/dL (ref 0.0–40.0)

## 2014-12-02 ENCOUNTER — Other Ambulatory Visit: Payer: Self-pay | Admitting: Family Medicine

## 2014-12-02 DIAGNOSIS — E785 Hyperlipidemia, unspecified: Secondary | ICD-10-CM

## 2015-01-25 ENCOUNTER — Telehealth: Payer: Self-pay | Admitting: Family Medicine

## 2015-01-25 MED ORDER — PREDNISONE 20 MG PO TABS: ORAL_TABLET | ORAL | Status: DC

## 2015-01-25 NOTE — Telephone Encounter (Signed)
Per Dr Tawanna Coolerodd call in Prednisone 20 mg 2 tabs x3 days, 1 tab x3 days, 1/2 tab x3 days, then 1/2 tab M, W, F for a 2 wk taper #30/1 refill.  Pt aware, also [facility] [phone] and told her to call them to find out what immunizations she needs for travel.  She verbalized understanding, she wanted to know what to do about the snoring.  Told pt to try the prednisone to make sure it isn't related to the fluid and if she still continued to snore after completing the prednisone to call us back.  Pt agreed

## 2015-01-25 NOTE — Telephone Encounter (Signed)
Pt states her ears keeps getting stopped up/ can't hear, (fluid per dr todd), , pt gets hoarse, and pt snores, also per dr todd  a poss a hole behind nose.  Pt would like to know who dr todd might recommend for pt to see for these issues,  Also, [place] in [place] and does pt need any additional immunizations?

## 2015-02-02 ENCOUNTER — Other Ambulatory Visit (INDEPENDENT_AMBULATORY_CARE_PROVIDER_SITE_OTHER): Payer: Medicare Other

## 2015-02-02 DIAGNOSIS — E785 Hyperlipidemia, unspecified: Secondary | ICD-10-CM

## 2015-02-02 LAB — LIPID PANEL
CHOLESTEROL: 205 mg/dL — AB (ref 0–200)
HDL: 59.7 mg/dL (ref >39.00)
LDL Cholesterol: 121 mg/dL — ABNORMAL HIGH (ref 0–99)
NONHDL: 145.3
Total CHOL/HDL Ratio: 3
Triglycerides: 124 mg/dL (ref 0.0–149.0)
VLDL: 24.8 mg/dL (ref 0.0–40.0)

## 2015-02-17 ENCOUNTER — Telehealth: Payer: Self-pay | Admitting: Family Medicine

## 2015-02-17 NOTE — Telephone Encounter (Signed)
Patient would like her lab results

## 2015-02-18 NOTE — Telephone Encounter (Signed)
Attempted to call patient but unable to leave a message.  Per Dr Tawanna Coolerodd lab is normal and she should continue her current medications as directed.  Will try to call patient again.

## 2015-02-23 ENCOUNTER — Other Ambulatory Visit: Payer: Self-pay | Admitting: Family Medicine

## 2015-02-23 DIAGNOSIS — M549 Dorsalgia, unspecified: Secondary | ICD-10-CM

## 2015-02-23 NOTE — Telephone Encounter (Signed)
Left message on machine returning patient's call 

## 2015-02-24 NOTE — Telephone Encounter (Signed)
Patient is aware 

## 2015-03-29 ENCOUNTER — Other Ambulatory Visit: Payer: Self-pay | Admitting: Family

## 2015-04-13 ENCOUNTER — Telehealth: Payer: Self-pay | Admitting: Family Medicine

## 2015-04-13 DIAGNOSIS — Z1239 Encounter for other screening for malignant neoplasm of breast: Secondary | ICD-10-CM

## 2015-04-13 NOTE — Telephone Encounter (Signed)
Patient was advised to schedule breast cancer gene test, which she did but was told an order or referral was needed. She is schedule at [facility] with Roma Kayser on 04/21/15 at 1.  BRCA gene test

## 2015-04-14 ENCOUNTER — Telehealth: Payer: Self-pay | Admitting: Genetic Counselor

## 2015-04-15 NOTE — Telephone Encounter (Signed)
Referral placed.

## 2015-04-20 NOTE — Telephone Encounter (Signed)
Called pt and scheduled new pt gen counseling appt.  Maylon CosKaren Powell 04/21/15 1pm  Dx: Breast Cancer Screening Referring:  [facility]

## 2015-04-21 ENCOUNTER — Other Ambulatory Visit: Payer: Medicare Other

## 2015-04-21 ENCOUNTER — Encounter: Payer: Self-pay | Admitting: Genetic Counselor

## 2015-04-21 ENCOUNTER — Ambulatory Visit (HOSPITAL_BASED_OUTPATIENT_CLINIC_OR_DEPARTMENT_OTHER): Payer: Medicare Other | Admitting: Genetic Counselor

## 2015-04-21 DIAGNOSIS — Z315 Encounter for genetic counseling: Secondary | ICD-10-CM

## 2015-04-21 DIAGNOSIS — Z803 Family history of malignant neoplasm of breast: Secondary | ICD-10-CM | POA: Diagnosis not present

## 2015-04-21 NOTE — Progress Notes (Signed)
REFERRING PROVIDER: Dorena Cookey, MD [place], [place] [address]  PRIMARY PROVIDER:  Joycelyn Man, MD  PRIMARY REASON FOR VISIT:  1. Family history of breast cancer      HISTORY OF PRESENT ILLNESS:   Ms. Gangwer, a 64 y.o. female, was seen for a [place] cancer genetics consultation at the request of Dr. Sherren Mocha due to a family history of cancer.  Ms. Monaco presents to clinic today to discuss the possibility of a hereditary predisposition to cancer, genetic testing, and to further clarify her future cancer risks, as well as potential cancer risks for family members. Ms. Ruble has no personal history of cancer.    CANCER HISTORY:   No history exists.     HORMONAL RISK FACTORS:  Menarche was at age 48.  First live birth at age 76.  OCP use for approximately 15+ years.  Ovaries intact: yes.  Hysterectomy: no.  Menopausal status: postmenopausal.  HRT use: 0 years. Colonoscopy: yes; normal. Mammogram within the last year: yes. Number of breast biopsies: 0. Up to date with pelvic exams:  yes. Any excessive radiation exposure in the past:  no  Past Medical History  Diagnosis Date  . Allergy   . Depression   . Hyperlipidemia   . Family history of breast cancer     History reviewed. No pertinent past surgical history.  History   Social History  . Marital Status: Single    Spouse Name: N/A  . Number of Children: 1  . Years of Education: N/A   Social History Main Topics  . Smoking status: Former Smoker -- 1.00 packs/day for 15 years    Types: Cigarettes    Quit date: 04/21/1983  . Smokeless tobacco: Not on file  . Alcohol Use: Yes     Comment: socially  . Drug Use: Not on file  . Sexual Activity: Not on file   Other Topics Concern  . None   Social History Narrative     FAMILY HISTORY:  We obtained a detailed, 4-generation family history.  Significant diagnoses are listed below: Family History  Problem Relation Age of Onset  .  Hyperlipidemia Other   . Hypertension Other   . Thyroid disease Other   . Breast cancer Mother 64  . Bone cancer Father 64  . Breast cancer Sister 65  . Heart attack Sister   . Breast cancer Maternal Aunt     dx >50  . Cancer Maternal Aunt     NOS  . Breast cancer Other     MGF's sister #1  . Breast cancer Other     MGF's sister #2   The patient had two sisters and one brother.  One sister died of a heart attack at age 75, and the other sister recently was diagnosed with breast cancer and had a mastectomy.  Her mother was diagnosed with breast cancer at age 91 and her father passed away with bone cancer at the age of 30.  Ms. Dimalanta mother had six sisters and one brother.  One sister was diagnosed with breast cancer over 34 and the other died at an unknown age, but had cancer earlier in her life.  Ms. Cariker grandparents did not have cancer, but her maternal grandfather had two sisters who had breast cancer.  There is no other contributory family history. Patient's maternal ancestors are of Native [place] American descent, and paternal ancestors are of African American descent. There is no reported Ashkenazi [place]  ancestry. There is no known consanguinity.  GENETIC COUNSELING ASSESSMENT: JENNIFE ZAUCHA is a 64 y.o. female with a family history of breast cancer which is somewhat suggestive of a hereditary cancer syndrome and predisposition to cancer. We, therefore, discussed and recommended the following at today's visit.   DISCUSSION: We reviewed the characteristics, features and inheritance patterns of hereditary cancer syndromes. We reviewed hereditary cancer syndromes including BRCA and others.  We discussed the benefits of performing a large 21 gene panel vs a smaller 9 gene panel, in that we have good risk management options with the smaller panel but the larger panel is more comprehensive.  We also discussed genetic testing, including the appropriate family members to test,  the process of testing, insurance coverage and turn-around-time for results. We discussed the implications of a negative, positive and/or variant of uncertain significant result. We recommended Ms. Leja pursue genetic testing for the Breast High/Moderate risk gene panel. The Breast High/Moderate Risk gene panel offered by [facility] includes sequencing and deletion/duplication analysis of the following 9 genes: ATM, BRCA1, BRCA2, CDH1, CHEK2, PALB2, PTEN, STK11, and TP53.   PLAN: After considering the risks, benefits, and limitations, Ms. Fenderson  provided informed consent to pursue genetic testing and the blood sample was sent to [facility] for analysis of the Breast High/Moderate risk panel. Results should be available within approximately 2-3 weeks' time, at which point they will be disclosed by telephone to Ms. Darnold, as will any additional recommendations warranted by these results. Ms. Beecher will receive a summary of her genetic counseling visit and a copy of her results once available. This information will also be available in Epic. We encouraged Ms. Devino to remain in contact with cancer genetics annually so that we can continuously update the family history and inform her of any changes in cancer genetics and testing that may be of benefit for her family. Ms. Sledd questions were answered to her satisfaction today. Our contact information was provided should additional questions or concerns arise.  Based on Ms. Culley's family history, we recommended her sister or mother, who was diagnosed with breast, have genetic counseling and testing. Ms. Salay will let us know if we can be of any assistance in coordinating genetic counseling and/or testing for this family member.   Lastly, we encouraged Ms. Colville to remain in contact with cancer genetics annually so that we can continuously update the family history and inform her of any changes in cancer genetics and testing that may be of benefit for this  family.   Ms.  Arthurs questions were answered to her satisfaction today. Our contact information was provided should additional questions or concerns arise. Thank you for the referral and allowing [name] to share in the care of your patient.   Karen P. Florene Glen, [place], [facility] Certified Genetic Counselor [email or web address] phone: [phone]  The patient was seen for a total of 36 minutes in face-to-face genetic counseling.  This patient was discussed with Drs. Magrinat, Lindi Adie and/or Burr Medico who agrees with the above.    _______________________________________________________________________ For Office Staff:  Number of people involved in session: 1 Was an Intern/ student involved with case: no

## 2015-05-10 ENCOUNTER — Encounter: Payer: Self-pay | Admitting: Genetic Counselor

## 2015-05-10 ENCOUNTER — Telehealth: Payer: Self-pay | Admitting: Genetic Counselor

## 2015-05-10 DIAGNOSIS — Z803 Family history of malignant neoplasm of breast: Secondary | ICD-10-CM

## 2015-05-10 DIAGNOSIS — Z1379 Encounter for other screening for genetic and chromosomal anomalies: Secondary | ICD-10-CM

## 2015-05-10 NOTE — Telephone Encounter (Signed)
Revealed negative genetic testing on the Breast High / Moderate Risk panel.  Discussed that her sister or mother may want to undergo genetic testing in order to be more informative for the family.  She reports that her sister is going to get tested.

## 2015-05-10 NOTE — Progress Notes (Signed)
HPI: Ms. Notch was previously seen in the [facility] due to a family history of cancer and concerns regarding a hereditary predisposition to cancer. Please refer to our prior cancer genetics clinic note for more information regarding Ms. Mazor's medical, social and family histories, and our assessment and recommendations, at the time. Ms. Baise recent genetic test results were disclosed to her, as were recommendations warranted by these results. These results and recommendations are discussed in more detail below.  GENETIC TEST RESULTS: At the time of Ms. Monaco's visit, we recommended she pursue genetic testing of the Breast High/Moderate Risk gene panel. The Breast High/Moderate Risk gene panel offered by [facility] includes sequencing and deletion/duplication analysis of the following 9 genes: ATM, BRCA1, BRCA2, CDH1, CHEK2, PALB2, PTEN, STK11, and TP53.  The report date is May 10, 2015.  Genetic testing was normal, and did not reveal a deleterious mutation in these genes. The test report has been scanned into EPIC and is located under the Media tab.   We discussed with Ms. Jorden that since the current genetic testing is not perfect, it is possible there may be a gene mutation in one of these genes that current testing cannot detect, but that chance is small. We also discussed, that it is possible that another gene that has not yet been discovered, or that we have not yet tested, is responsible for the cancer diagnoses in the family, and it is, therefore, important to remain in touch with cancer genetics in the future so that we can continue to offer Ms. Patton the most up to date genetic testing.   ADDITIONAL GENETIC TESTING: We discussed with Ms. Dillow that there are other genes that are associated with increased cancer risk that can be analyzed. The laboratories that offer such testing look at these additional genes via a hereditary cancer gene panel. Should Ms. Orne wish to  pursue additional genetic testing, we are happy to discuss and coordinate this testing, at any time.    CANCER SCREENING RECOMMENDATIONS: This normal result is reassuring and indicates that Ms. Eckersley does not likely have an increased risk of cancer due to a mutation in one of these genes.  We, therefore, recommended  Ms. Mulcahey continue to follow the cancer screening guidelines provided by her primary healthcare providers.   RECOMMENDATIONS FOR FAMILY MEMBERS: Women in this family might be at some increased risk of developing cancer, over the general population risk, simply due to the family history of cancer. We recommended women in this family have a yearly mammogram beginning at age 32, or 65 years younger than the earliest onset of cancer, an an annual clinical breast exam, and perform monthly breast self-exams. Women in this family should also have a gynecological exam as recommended by their primary provider. All family members should have a colonoscopy by age 38.  Based on Ms. Duffy's family history, we recommended her mother or sister, who was diagnosed with breast cancer, have genetic counseling and testing. Ms. Gingras will let us know if we can be of any assistance in coordinating genetic counseling and/or testing for this family member.   FOLLOW-UP: Lastly, we discussed with Ms. Journey that cancer genetics is a rapidly advancing field and it is possible that new genetic tests will be appropriate for her and/or her family members in the future. We encouraged her to remain in contact with cancer genetics on an annual basis so we can update her personal and family histories and let her know  of advances in cancer genetics that may benefit this family.   Our contact number was provided. Ms. Buren questions were answered to her satisfaction, and she knows she is welcome to call us at anytime with additional questions or concerns.   Roma Kayser, MS, [facility] Certified Genetic  Counselor [email or web address]

## 2015-06-04 ENCOUNTER — Other Ambulatory Visit: Payer: Self-pay | Admitting: Family Medicine

## 2015-08-03 ENCOUNTER — Other Ambulatory Visit: Payer: Self-pay

## 2015-08-03 DIAGNOSIS — Z1231 Encounter for screening mammogram for malignant neoplasm of breast: Secondary | ICD-10-CM

## 2015-08-06 ENCOUNTER — Telehealth: Payer: Self-pay | Admitting: Family Medicine

## 2015-08-06 NOTE — Telephone Encounter (Signed)
Pt had last cpx oct 2015. Pt does not want to have her next cpx 2017. Can I create 30  Min slot for this year ?

## 2015-08-06 NOTE — Telephone Encounter (Signed)
Per Dr Tawanna Cooler he is not working patient's in.  Okay to see another provider if she wants.

## 2015-08-12 NOTE — Telephone Encounter (Signed)
lmom for pt to call back

## 2015-08-17 ENCOUNTER — Ambulatory Visit
Admission: RE | Admit: 2015-08-17 | Discharge: 2015-08-17 | Disposition: A | Discharge status: Home / Self Care | Payer: Medicare Other | Source: Ambulatory Visit

## 2015-08-17 DIAGNOSIS — Z1231 Encounter for screening mammogram for malignant neoplasm of breast: Secondary | ICD-10-CM

## 2015-08-18 NOTE — Telephone Encounter (Signed)
lmom for pt to call back

## 2015-08-19 NOTE — Telephone Encounter (Signed)
If I have any openings for a CPE before Dr. Tawanna Cooler does then I will see her.

## 2015-08-19 NOTE — Telephone Encounter (Signed)
Dr Tawanna Cooler has declined any work in physicals and has told pt ok to see another provider. Note below. Pt asked if you would do her cpe. Advised pt I would need permission to do so.  Pls advise if ok to schedule.

## 2015-08-20 NOTE — Telephone Encounter (Signed)
Fleet Contras pt said her ins said she must have a physical before end of year. Can I work in pt in w/dr todd.

## 2015-08-24 NOTE — Telephone Encounter (Signed)
Okay to work in per Dr Todd 

## 2015-08-24 NOTE — Telephone Encounter (Signed)
Pt has been sch

## 2015-09-21 ENCOUNTER — Other Ambulatory Visit (INDEPENDENT_AMBULATORY_CARE_PROVIDER_SITE_OTHER): Payer: Medicare Other

## 2015-09-21 DIAGNOSIS — Z0000 Encounter for general adult medical examination without abnormal findings: Secondary | ICD-10-CM

## 2015-09-21 DIAGNOSIS — E785 Hyperlipidemia, unspecified: Secondary | ICD-10-CM

## 2015-09-21 LAB — HEPATIC FUNCTION PANEL
ALK PHOS: 60 U/L (ref 39–117)
ALT: 13 U/L (ref 0–35)
AST: 15 U/L (ref 0–37)
Albumin: 3.9 g/dL (ref 3.5–5.2)
BILIRUBIN DIRECT: 0.1 mg/dL (ref 0.0–0.3)
BILIRUBIN TOTAL: 0.5 mg/dL (ref 0.2–1.2)
TOTAL PROTEIN: 6.6 g/dL (ref 6.0–8.3)

## 2015-09-21 LAB — CBC WITH DIFFERENTIAL/PLATELET
BASOS ABS: 0 10*3/uL (ref 0.0–0.1)
Basophils Relative: 0.8 % (ref 0.0–3.0)
EOS ABS: 0.1 10*3/uL (ref 0.0–0.7)
Eosinophils Relative: 2.6 % (ref 0.0–5.0)
HCT: 39.1 % (ref 36.0–46.0)
Hemoglobin: 12.6 g/dL (ref 12.0–15.0)
LYMPHS ABS: 1.2 10*3/uL (ref 0.7–4.0)
Lymphocytes Relative: 24.1 % (ref 12.0–46.0)
MCHC: 32.2 g/dL (ref 30.0–36.0)
MCV: 82.3 fl (ref 78.0–100.0)
MONO ABS: 0.5 10*3/uL (ref 0.1–1.0)
MONOS PCT: 9.7 % (ref 3.0–12.0)
NEUTROS PCT: 62.8 % (ref 43.0–77.0)
Neutro Abs: 3.2 10*3/uL (ref 1.4–7.7)
PLATELETS: 274 10*3/uL (ref 150.0–400.0)
RBC: 4.75 Mil/uL (ref 3.87–5.11)
RDW: 14.5 % (ref 11.5–15.5)
WBC: 5.2 10*3/uL (ref 4.0–10.5)

## 2015-09-21 LAB — LIPID PANEL
CHOL/HDL RATIO: 3
Cholesterol: 186 mg/dL (ref 0–200)
HDL: 57.7 mg/dL (ref >39.00)
LDL CALC: 109 mg/dL — AB (ref 0–99)
NONHDL: 128.08
Triglycerides: 94 mg/dL (ref 0.0–149.0)
VLDL: 18.8 mg/dL (ref 0.0–40.0)

## 2015-09-21 LAB — BASIC METABOLIC PANEL
BUN: 13 mg/dL (ref 6–23)
CALCIUM: 9.1 mg/dL (ref 8.4–10.5)
CO2: 31 meq/L (ref 19–32)
Chloride: 104 mEq/L (ref 96–112)
Creatinine, Ser: 0.74 mg/dL (ref 0.40–1.20)
GFR: 100.98 mL/min (ref >60.00)
Glucose, Bld: 85 mg/dL (ref 70–99)
Potassium: 4 mEq/L (ref 3.5–5.1)
SODIUM: 143 meq/L (ref 135–145)

## 2015-09-21 LAB — POCT URINALYSIS DIPSTICK
BILIRUBIN UA: NEGATIVE
GLUCOSE UA: NEGATIVE
KETONES UA: NEGATIVE
LEUKOCYTES UA: NEGATIVE
NITRITE UA: NEGATIVE
PH UA: 7
Protein, UA: NEGATIVE
Spec Grav, UA: 1.02
Urobilinogen, UA: 0.2

## 2015-09-21 LAB — TSH: TSH: 1.18 u[IU]/mL (ref 0.35–4.50)

## 2015-09-28 ENCOUNTER — Encounter: Payer: Self-pay | Admitting: Family Medicine

## 2015-09-28 ENCOUNTER — Ambulatory Visit (INDEPENDENT_AMBULATORY_CARE_PROVIDER_SITE_OTHER): Payer: Medicare Other | Admitting: Family Medicine

## 2015-09-28 VITALS — BP 120/84 | Temp 98.4°F | Ht 63.5 in | Wt 166.0 lb

## 2015-09-28 DIAGNOSIS — J3089 Other allergic rhinitis: Secondary | ICD-10-CM | POA: Diagnosis not present

## 2015-09-28 DIAGNOSIS — Z0000 Encounter for general adult medical examination without abnormal findings: Secondary | ICD-10-CM | POA: Diagnosis not present

## 2015-09-28 DIAGNOSIS — F32A Depression, unspecified: Secondary | ICD-10-CM

## 2015-09-28 DIAGNOSIS — F329 Major depressive disorder, single episode, unspecified: Secondary | ICD-10-CM

## 2015-09-28 DIAGNOSIS — Z23 Encounter for immunization: Secondary | ICD-10-CM

## 2015-09-28 DIAGNOSIS — E782 Mixed hyperlipidemia: Secondary | ICD-10-CM

## 2015-09-28 MED ORDER — CITALOPRAM HYDROBROMIDE 40 MG PO TABS: 40.0000 mg | ORAL_TABLET | ORAL | Status: DC

## 2015-09-28 MED ORDER — SIMVASTATIN 40 MG PO TABS: 40.0000 mg | ORAL_TABLET | Freq: Every day | ORAL | Status: DC

## 2015-09-28 MED ORDER — LORAZEPAM 0.5 MG PO TABS: ORAL_TABLET | ORAL | Status: DC

## 2015-09-28 MED ORDER — FLUTICASONE PROPIONATE 50 MCG/ACT NA SUSP: 2.0000 | Freq: Every day | NASAL | Status: DC

## 2015-09-28 NOTE — Progress Notes (Signed)
   Subjective:    Patient ID: Tamara Hughes, female    DOB: May 03, 1951, 64 y.o.   MRN: [id number]  HPI Tamara Hughes is a 64 year old single female nonsmoker who comes in today for general physical examination because of a history of depression on Celexa 40 mg daily and Ativan 0.5 mg daily at bedtime when necessary. The Ativan she takes maybe 1 or 2 doses weekly  She also uses Flonase for allergic rhinitis  Uses Zocor 40 mg Monday Wednesday Friday along with an aspirin tablet Monday Wednesday Friday lipids are at goal  She gets routine eye care, dental care, BSE monthly, and you mammography, last colonoscopy at low bowel our GI. She doesn't  recall the date. 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 Her son Christiane Hughes is still in jail but hopes to get out soon.  [facility] and [facility] for an hour and a half.   Review of Systems  Constitutional: Negative.   HENT: Negative.   Eyes: Negative.   Respiratory: Negative.   Cardiovascular: Negative.   Gastrointestinal: Negative.   Endocrine: Negative.   Genitourinary: Negative.   Musculoskeletal: Negative.   Skin: Negative.   Allergic/Immunologic: Negative.   Neurological: Negative.   Hematological: Negative.   Psychiatric/Behavioral: Negative.        Objective:   Physical Exam  Constitutional: She appears well-developed and well-nourished.  HENT:  Head: Normocephalic and atraumatic.  Right Ear: External ear normal.  Left Ear: External ear normal.  Nose: Nose normal.  Mouth/Throat: Oropharynx is clear and moist.  Eyes: EOM are normal. Pupils are equal, round, and reactive to light.  Neck: Normal range of motion. Neck supple. No JVD present. No tracheal deviation present. No thyromegaly present.  Cardiovascular: Normal rate, regular rhythm, normal heart sounds and  intact distal pulses.  Exam reveals no gallop and no friction rub.   No murmur heard. Pulmonary/Chest: Effort normal and breath sounds normal. No stridor. No respiratory distress. She has no wheezes. She has no rales. She exhibits no tenderness.  Abdominal: Soft. Bowel sounds are normal. She exhibits no distension and no mass. There is no tenderness. There is no rebound and no guarding.  Genitourinary:  Bilateral breast exam normal  Pelvic and rectal deferred. Pelvic exam 2 years ago normal.  Musculoskeletal: Normal range of motion.  Lymphadenopathy:    She has no cervical adenopathy.  Neurological: She is alert. She has normal reflexes. No cranial nerve deficit. She exhibits normal muscle tone. Coordination normal.  Skin: Skin is warm and dry. No rash noted. No erythema. No pallor.  Psychiatric: She has a normal mood and affect. Her behavior is normal. Judgment and thought content normal.  Nursing note and vitals reviewed.         Assessment & Plan:  Healthy female  Hyperlipidemia........... at goal.....[name] continue Zocor and aspirin Monday Wednesday Friday  History of depression.......... continue Celexa Ativan when necessary  Allergic rhinitis continue Flonase.

## 2015-09-28 NOTE — Patient Instructions (Signed)
Continue current medications  Follow-up in one year sooner if any problems  Call GI,,,,,,,,,, [phone],,,,,,,,,,,, to find out when your next colonoscopy is due

## 2015-09-28 NOTE — Progress Notes (Signed)
Pre visit review using our clinic review tool, if applicable. No additional management support is needed unless otherwise documented below in the visit note. 

## 2016-04-06 DIAGNOSIS — H6992 Unspecified Eustachian tube disorder, left ear: Secondary | ICD-10-CM | POA: Insufficient documentation

## 2016-04-06 DIAGNOSIS — M26623 Arthralgia of bilateral temporomandibular joint: Secondary | ICD-10-CM | POA: Insufficient documentation

## 2016-04-06 DIAGNOSIS — H9203 Otalgia, bilateral: Secondary | ICD-10-CM | POA: Insufficient documentation

## 2016-06-02 ENCOUNTER — Telehealth: Payer: Self-pay

## 2016-06-02 NOTE — Telephone Encounter (Signed)
Patient is on the list for [employer] 2017 and may be a good candidate for an AWV in 2017. Please let me know if/when appt is scheduled.   Due around October

## 2016-06-07 NOTE — Telephone Encounter (Signed)
This patient is in her first year of Medicare and will need to schedule a "Welcome to Medicare visit" with the MD only.

## 2016-07-07 ENCOUNTER — Other Ambulatory Visit: Payer: Self-pay | Admitting: Family Medicine

## 2016-07-07 DIAGNOSIS — Z1231 Encounter for screening mammogram for malignant neoplasm of breast: Secondary | ICD-10-CM

## 2016-08-17 ENCOUNTER — Ambulatory Visit
Admission: RE | Admit: 2016-08-17 | Discharge: 2016-08-17 | Disposition: A | Discharge status: Home / Self Care | Payer: Medicare Other | Source: Ambulatory Visit | Attending: Family Medicine | Admitting: Family Medicine

## 2016-08-17 DIAGNOSIS — Z1231 Encounter for screening mammogram for malignant neoplasm of breast: Secondary | ICD-10-CM

## 2016-09-05 ENCOUNTER — Other Ambulatory Visit (INDEPENDENT_AMBULATORY_CARE_PROVIDER_SITE_OTHER): Payer: Medicare Other

## 2016-09-05 DIAGNOSIS — Z0000 Encounter for general adult medical examination without abnormal findings: Secondary | ICD-10-CM | POA: Diagnosis not present

## 2016-09-05 LAB — POC URINALSYSI DIPSTICK (AUTOMATED)
Bilirubin, UA: NEGATIVE
Glucose, UA: NEGATIVE
Ketones, UA: NEGATIVE
NITRITE UA: NEGATIVE
Protein, UA: NEGATIVE
Spec Grav, UA: 1.02
Urobilinogen, UA: 0.2
pH, UA: 6.5

## 2016-09-05 LAB — CBC WITH DIFFERENTIAL/PLATELET
BASOS PCT: 0.8 % (ref 0.0–3.0)
Basophils Absolute: 0 10*3/uL (ref 0.0–0.1)
EOS ABS: 0.2 10*3/uL (ref 0.0–0.7)
Eosinophils Relative: 3.4 % (ref 0.0–5.0)
HEMATOCRIT: 38.3 % (ref 36.0–46.0)
Hemoglobin: 12.5 g/dL (ref 12.0–15.0)
LYMPHS ABS: 1.5 10*3/uL (ref 0.7–4.0)
LYMPHS PCT: 25.8 % (ref 12.0–46.0)
MCHC: 32.6 g/dL (ref 30.0–36.0)
MCV: 80.8 fl (ref 78.0–100.0)
Monocytes Absolute: 0.6 10*3/uL (ref 0.1–1.0)
Monocytes Relative: 10.4 % (ref 3.0–12.0)
NEUTROS ABS: 3.5 10*3/uL (ref 1.4–7.7)
Neutrophils Relative %: 59.6 % (ref 43.0–77.0)
PLATELETS: 277 10*3/uL (ref 150.0–400.0)
RBC: 4.74 Mil/uL (ref 3.87–5.11)
RDW: 14.5 % (ref 11.5–15.5)
WBC: 5.9 10*3/uL (ref 4.0–10.5)

## 2016-09-05 LAB — BASIC METABOLIC PANEL
BUN: 10 mg/dL (ref 6–23)
CHLORIDE: 105 meq/L (ref 96–112)
CO2: 32 meq/L (ref 19–32)
CREATININE: 0.69 mg/dL (ref 0.40–1.20)
Calcium: 9.1 mg/dL (ref 8.4–10.5)
GFR: 109.15 mL/min (ref >60.00)
GLUCOSE: 89 mg/dL (ref 70–99)
Potassium: 3.7 mEq/L (ref 3.5–5.1)
Sodium: 142 mEq/L (ref 135–145)

## 2016-09-05 LAB — LIPID PANEL
Cholesterol: 204 mg/dL — ABNORMAL HIGH (ref 0–200)
HDL: 51.3 mg/dL (ref >39.00)
LDL Cholesterol: 128 mg/dL — ABNORMAL HIGH (ref 0–99)
NonHDL: 152.59
TRIGLYCERIDES: 123 mg/dL (ref 0.0–149.0)
Total CHOL/HDL Ratio: 4
VLDL: 24.6 mg/dL (ref 0.0–40.0)

## 2016-09-05 LAB — TSH: TSH: 1.93 u[IU]/mL (ref 0.35–4.50)

## 2016-09-05 LAB — HEPATIC FUNCTION PANEL
ALBUMIN: 3.9 g/dL (ref 3.5–5.2)
ALT: 16 U/L (ref 0–35)
AST: 18 U/L (ref 0–37)
Alkaline Phosphatase: 54 U/L (ref 39–117)
Bilirubin, Direct: 0.1 mg/dL (ref 0.0–0.3)
TOTAL PROTEIN: 6.7 g/dL (ref 6.0–8.3)
Total Bilirubin: 0.7 mg/dL (ref 0.2–1.2)

## 2016-09-13 ENCOUNTER — Ambulatory Visit (INDEPENDENT_AMBULATORY_CARE_PROVIDER_SITE_OTHER): Payer: Medicare Other | Admitting: Family Medicine

## 2016-09-13 ENCOUNTER — Other Ambulatory Visit (HOSPITAL_COMMUNITY)
Admission: RE | Admit: 2016-09-13 | Discharge: 2016-09-13 | Disposition: A | Discharge status: Home / Self Care | Payer: Medicare Other | Source: Ambulatory Visit | Attending: Family Medicine | Admitting: Family Medicine

## 2016-09-13 ENCOUNTER — Encounter: Payer: Self-pay | Admitting: Family Medicine

## 2016-09-13 VITALS — BP 126/76 | HR 73 | Temp 97.6°F | Wt 173.2 lb

## 2016-09-13 DIAGNOSIS — F329 Major depressive disorder, single episode, unspecified: Secondary | ICD-10-CM | POA: Diagnosis not present

## 2016-09-13 DIAGNOSIS — Z124 Encounter for screening for malignant neoplasm of cervix: Secondary | ICD-10-CM | POA: Insufficient documentation

## 2016-09-13 DIAGNOSIS — Z0000 Encounter for general adult medical examination without abnormal findings: Secondary | ICD-10-CM | POA: Diagnosis not present

## 2016-09-13 DIAGNOSIS — Z23 Encounter for immunization: Secondary | ICD-10-CM

## 2016-09-13 DIAGNOSIS — F32A Depression, unspecified: Secondary | ICD-10-CM

## 2016-09-13 DIAGNOSIS — I10 Essential (primary) hypertension: Secondary | ICD-10-CM | POA: Diagnosis not present

## 2016-09-13 MED ORDER — LORAZEPAM 0.5 MG PO TABS: ORAL_TABLET | ORAL | 5 refills | Status: DC

## 2016-09-13 MED ORDER — CITALOPRAM HYDROBROMIDE 40 MG PO TABS: 40.0000 mg | ORAL_TABLET | ORAL | 3 refills | Status: DC

## 2016-09-13 MED ORDER — SIMVASTATIN 40 MG PO TABS: 40.0000 mg | ORAL_TABLET | Freq: Every day | ORAL | 3 refills | Status: DC

## 2016-09-13 NOTE — Patient Instructions (Signed)
Zocor 40 mg............ one tablet every night  Continue other medications  Return in one year sooner if any problems  Kandee Keenory or Raynelle FanningJulie are 2 new adult nurse practitioner's or Dr. [place]

## 2016-09-13 NOTE — Progress Notes (Signed)
Tamara Hughes is a 65 year old single female nonsmoker who comes in today for general physical examination because of a history of allergic rhinitis, depression, hyperlipidemia, mild anxiety  She takes Zocor 40 mg Monday Wednesday Friday and a baby aspirin daily. Lipids are not at goal asked her to take the Zocor daily  She takes Celexa 40 mg daily at bedtime because of a history of mild depression she's doing well and wishes to continue her medication  She is over-the-counter steroid nasal spray for allergic rhinitis and Ativan 0.51 tablet weekly when necessary. She's taking care of her 67 year old mother and her son Tamara Hughes's been in jail with a drug charge now for 15+ years. She is a retired Chartered loss adjusterschoolteacher  She gets routine eye care, dental care, colonoscopy 2008 was normal.  Vaccinations seasonal flu shot given today  Mammogram August 2017 was normal.  Review of systems otherwise negative except she's not exercising or following a diet  Physical examination  Vital signs stable she's afebrile HEENT were negative except for bilateral cataracts juvenile. Neck was supple thyroid normal no adenopathy no carotid bruits cardiopulmonary exam normal breast exam normal abdominal exam normal pelvic examination external genitalia within normal limits vaginal vault was normal cervix is visualized Pap smear was done bimanual exam negative rectal normal stool guaiac-negative extremity, skin no peripheral pulses normal  Impression  #1 hyperlipidemia not at goal........ increased Zocor to 40 mg daily  History of depression......... continue Celexa 40 mg daily  #3 allergic rhinitis....... continue Flonase  History of mild anxiety......[name]. Ativan when necessary.

## 2016-09-13 NOTE — Progress Notes (Signed)
Pre visit review using our clinic review tool, if applicable. No additional management support is needed unless otherwise documented below in the visit note. 

## 2016-09-14 LAB — CYTOLOGY - PAP

## 2017-01-24 ENCOUNTER — Other Ambulatory Visit: Payer: Self-pay | Admitting: Family Medicine

## 2017-01-24 DIAGNOSIS — J3089 Other allergic rhinitis: Secondary | ICD-10-CM

## 2017-04-10 ENCOUNTER — Other Ambulatory Visit: Payer: Self-pay | Admitting: Family Medicine

## 2017-07-19 ENCOUNTER — Other Ambulatory Visit: Payer: Self-pay | Admitting: Family Medicine

## 2017-07-19 DIAGNOSIS — Z1231 Encounter for screening mammogram for malignant neoplasm of breast: Secondary | ICD-10-CM

## 2017-08-20 ENCOUNTER — Ambulatory Visit
Admission: RE | Admit: 2017-08-20 | Discharge: 2017-08-20 | Disposition: A | Discharge status: Home / Self Care | Payer: Medicare Other | Source: Ambulatory Visit | Attending: Family Medicine | Admitting: Family Medicine

## 2017-08-20 DIAGNOSIS — Z1231 Encounter for screening mammogram for malignant neoplasm of breast: Secondary | ICD-10-CM

## 2017-09-24 ENCOUNTER — Encounter: Payer: Self-pay | Admitting: Family Medicine

## 2017-09-24 ENCOUNTER — Ambulatory Visit (INDEPENDENT_AMBULATORY_CARE_PROVIDER_SITE_OTHER): Payer: Medicare Other | Admitting: Family Medicine

## 2017-09-24 VITALS — BP 136/88 | HR 70 | Temp 98.1°F | Ht 63.0 in | Wt 173.0 lb

## 2017-09-24 DIAGNOSIS — F325 Major depressive disorder, single episode, in full remission: Secondary | ICD-10-CM

## 2017-09-24 DIAGNOSIS — J3089 Other allergic rhinitis: Secondary | ICD-10-CM

## 2017-09-24 DIAGNOSIS — E785 Hyperlipidemia, unspecified: Secondary | ICD-10-CM

## 2017-09-24 DIAGNOSIS — R0789 Other chest pain: Secondary | ICD-10-CM | POA: Diagnosis not present

## 2017-09-24 DIAGNOSIS — Z23 Encounter for immunization: Secondary | ICD-10-CM | POA: Diagnosis not present

## 2017-09-24 DIAGNOSIS — Z0000 Encounter for general adult medical examination without abnormal findings: Secondary | ICD-10-CM | POA: Diagnosis not present

## 2017-09-24 LAB — LIPID PANEL
CHOL/HDL RATIO: 3
Cholesterol: 192 mg/dL (ref 0–200)
HDL: 58.7 mg/dL (ref >39.00)
LDL Cholesterol: 115 mg/dL — ABNORMAL HIGH (ref 0–99)
NONHDL: 133.69
TRIGLYCERIDES: 94 mg/dL (ref 0.0–149.0)
VLDL: 18.8 mg/dL (ref 0.0–40.0)

## 2017-09-24 LAB — CBC WITH DIFFERENTIAL/PLATELET
BASOS ABS: 0.1 10*3/uL (ref 0.0–0.1)
Basophils Relative: 1.2 % (ref 0.0–3.0)
EOS PCT: 2.8 % (ref 0.0–5.0)
Eosinophils Absolute: 0.2 10*3/uL (ref 0.0–0.7)
HCT: 40.8 % (ref 36.0–46.0)
HEMOGLOBIN: 13 g/dL (ref 12.0–15.0)
LYMPHS ABS: 1.1 10*3/uL (ref 0.7–4.0)
Lymphocytes Relative: 19.3 % (ref 12.0–46.0)
MCHC: 31.9 g/dL (ref 30.0–36.0)
MCV: 83.3 fl (ref 78.0–100.0)
MONO ABS: 0.6 10*3/uL (ref 0.1–1.0)
MONOS PCT: 10.1 % (ref 3.0–12.0)
NEUTROS PCT: 66.6 % (ref 43.0–77.0)
Neutro Abs: 3.7 10*3/uL (ref 1.4–7.7)
Platelets: 294 10*3/uL (ref 150.0–400.0)
RBC: 4.9 Mil/uL (ref 3.87–5.11)
RDW: 14.4 % (ref 11.5–15.5)
WBC: 5.6 10*3/uL (ref 4.0–10.5)

## 2017-09-24 LAB — HEPATIC FUNCTION PANEL
ALBUMIN: 4.1 g/dL (ref 3.5–5.2)
ALT: 12 U/L (ref 0–35)
AST: 15 U/L (ref 0–37)
Alkaline Phosphatase: 57 U/L (ref 39–117)
Bilirubin, Direct: 0.1 mg/dL (ref 0.0–0.3)
TOTAL PROTEIN: 6.3 g/dL (ref 6.0–8.3)
Total Bilirubin: 0.6 mg/dL (ref 0.2–1.2)

## 2017-09-24 LAB — BASIC METABOLIC PANEL
BUN: 9 mg/dL (ref 6–23)
CALCIUM: 9.3 mg/dL (ref 8.4–10.5)
CO2: 31 meq/L (ref 19–32)
CREATININE: 0.64 mg/dL (ref 0.40–1.20)
Chloride: 104 mEq/L (ref 96–112)
GFR: 118.67 mL/min (ref >60.00)
Glucose, Bld: 82 mg/dL (ref 70–99)
Potassium: 4.2 mEq/L (ref 3.5–5.1)
Sodium: 143 mEq/L (ref 135–145)

## 2017-09-24 LAB — POCT URINALYSIS DIPSTICK
Bilirubin, UA: NEGATIVE
Glucose, UA: NEGATIVE
KETONES UA: NEGATIVE
LEUKOCYTES UA: NEGATIVE
NITRITE UA: NEGATIVE
PH UA: 7.5 (ref 5.0–8.0)
Protein, UA: NEGATIVE
RBC UA: NEGATIVE
Spec Grav, UA: 1.015 (ref 1.010–1.025)
Urobilinogen, UA: 0.2 E.U./dL

## 2017-09-24 MED ORDER — FLUTICASONE PROPIONATE 50 MCG/ACT NA SUSP: NASAL | 6 refills | Status: DC

## 2017-09-24 MED ORDER — SIMVASTATIN 40 MG PO TABS: 40.0000 mg | ORAL_TABLET | Freq: Every day | ORAL | 3 refills | Status: DC

## 2017-09-24 NOTE — Patient Instructions (Signed)
Continue current medications  Start a walking program daily  We will get you set up for a treadmill cardiology to evaluate your chest pain.  Call your insurance company to find her where you can get the shingles vaccine  .

## 2017-09-24 NOTE — Progress Notes (Signed)
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  She uses Flonase for allergic rhinitis  She takes Ativan 0.5 daily at bedtime when necessary for extreme anxiety. She's now taking care of her 16 year old mother full time. Her mom said repeated but falls and broken bones. 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 I gave her the name of Jonette Pesa to discuss home health. States when she leaves her mom for extended periods of time she takes her down to [place] to stay with another family member. Christiane Ha is still in prison. This is a 17 year. He was involved with a drug-related murder.  She uses Zocor 40 mg daily along with an aspirin tablet because of hyperlipidemia  She exercises the Y episodically. She says sometimes she'll develop some severe chest pain and she points the middle of her chest when she begins exercising. It does not stop her. She continues to exercise and the pain goes away. She has no history of hypertension diabetes. Negative family history coronary artery disease. She is a nonsmoker.  Vaccinations she is due a Facilities manager and a flu shot  She gets routine eye care, dental care, BSE monthly, and you mammography, colonoscopy due this fall. She'll call GI.  Information given on shingles  Cognitive function normal she exercises sporadically. Home health safety reviewed no issues identified, no guns in the house, she does have a healthcare power of attorney and living well.  14 point review of systems reviewed and otherwise negative  EKG was done because a history of hyperlipidemia and the recent onset of anterior chest pain. EKG was normal and unchanged.  BP 136/88 (BP Location: Left Arm,  Patient Position: Sitting, Cuff Size: Normal)   Pulse 70   Temp 98.1 F (36.7 C) (Oral)   Ht  (1.6 m)   Wt 173 lb (78.5 kg)   BMI 30.65 kg/m  General she is a well-developed well-nourished female no acute distress HEENT were pertinent she has bilateral early cataracts. She is asymptomatic. Neck was supple thyroid is not enlarged no carotid bruits cardiopulmonary exam normal breast exam normal abdominal exam normal pelvic done last year and Pap normal therefore every 3 years. She's asymptomatic. Rectal via GI  Extremities normal skin no peripheral pulses normal  #1 hyperlipidemia,,,,,,,, check labs continue Zocor and aspirin  #2 history of mild depression,,,,,, continue Celexa  #3 allergic rhinitis,,,,,, continue steroid nasal spray  #4 occasional anxiety,,,,,,, Ativan 0.5 daily at bedtime  #5 anterior chest pain,,,,,, atypical for coronary artery disease,,,,, however we'll get a stress test.,

## 2017-10-01 ENCOUNTER — Encounter: Payer: Self-pay | Admitting: Internal Medicine

## 2017-10-10 ENCOUNTER — Telehealth: Payer: Self-pay | Admitting: Family Medicine

## 2017-10-10 NOTE — Telephone Encounter (Signed)
° ° ° ° °  Pt would like a call back about her labs that she had on 09/24/17.

## 2017-10-12 NOTE — Telephone Encounter (Signed)
Called pt left a message in regards to her request on her lab work results, notified pt that I will call her with her labs soon as Dr Tawanna Coolerodd releases them to me.

## 2017-10-19 NOTE — Telephone Encounter (Signed)
Spoke with pt voiced understanding that lab work is normal per Dr Tawanna Coolerodd.

## 2017-10-25 ENCOUNTER — Telehealth: Payer: Self-pay | Admitting: Family Medicine

## 2017-10-25 NOTE — Telephone Encounter (Signed)
Tamara PoundDeborah pt is returning your call.

## 2017-10-26 ENCOUNTER — Encounter: Payer: Self-pay | Admitting: Internal Medicine

## 2017-10-26 ENCOUNTER — Other Ambulatory Visit (HOSPITAL_COMMUNITY): Payer: Medicare Other

## 2017-11-06 ENCOUNTER — Other Ambulatory Visit: Payer: Self-pay

## 2017-11-06 ENCOUNTER — Other Ambulatory Visit: Payer: Self-pay | Admitting: Family Medicine

## 2017-11-06 DIAGNOSIS — F329 Major depressive disorder, single episode, unspecified: Secondary | ICD-10-CM

## 2017-11-06 DIAGNOSIS — F32A Depression, unspecified: Secondary | ICD-10-CM

## 2017-11-06 MED ORDER — CITALOPRAM HYDROBROMIDE 40 MG PO TABS: 40.0000 mg | ORAL_TABLET | ORAL | 3 refills | Status: DC

## 2017-11-20 ENCOUNTER — Other Ambulatory Visit (HOSPITAL_COMMUNITY): Payer: Medicare Other

## 2017-11-22 ENCOUNTER — Telehealth (HOSPITAL_COMMUNITY): Payer: Self-pay | Admitting: *Deleted

## 2017-11-22 NOTE — Telephone Encounter (Signed)
Patient given detailed instructions per Stress Test Requisition Sheet for test on 11/26/17 at 7:30.Patient Notified to arrive 30 minutes early, and that it is imperative to arrive on time for appointment to keep from having the test rescheduled.  Patient verbalized understanding. Daneil DolinSharon S Brooks

## 2017-11-26 ENCOUNTER — Ambulatory Visit (HOSPITAL_BASED_OUTPATIENT_CLINIC_OR_DEPARTMENT_OTHER): Payer: Medicare Other

## 2017-11-26 ENCOUNTER — Ambulatory Visit (HOSPITAL_COMMUNITY): Payer: Medicare Other | Attending: Internal Medicine

## 2017-11-26 DIAGNOSIS — R0789 Other chest pain: Secondary | ICD-10-CM | POA: Insufficient documentation

## 2017-11-26 DIAGNOSIS — E785 Hyperlipidemia, unspecified: Secondary | ICD-10-CM | POA: Diagnosis present

## 2017-12-13 ENCOUNTER — Other Ambulatory Visit: Payer: Self-pay

## 2017-12-13 ENCOUNTER — Ambulatory Visit (AMBULATORY_SURGERY_CENTER): Payer: Self-pay

## 2017-12-13 VITALS — Ht 63.0 in | Wt 177.6 lb

## 2017-12-13 DIAGNOSIS — Z1211 Encounter for screening for malignant neoplasm of colon: Secondary | ICD-10-CM

## 2017-12-13 MED ORDER — NA SULFATE-K SULFATE-MG SULF 17.5-3.13-1.6 GM/177ML PO SOLN: 1.0000 | Freq: Once | ORAL | 0 refills | Status: AC

## 2017-12-13 NOTE — Progress Notes (Signed)
Denies allergies to eggs or soy products. Denies complication of anesthesia or sedation. Denies use of weight loss medication. Denies use of O2.   Emmi instructions declined.  

## 2017-12-27 ENCOUNTER — Ambulatory Visit (AMBULATORY_SURGERY_CENTER): Payer: Medicare Other | Admitting: Internal Medicine

## 2017-12-27 ENCOUNTER — Other Ambulatory Visit: Payer: Self-pay

## 2017-12-27 ENCOUNTER — Encounter: Payer: Self-pay | Admitting: Internal Medicine

## 2017-12-27 VITALS — BP 110/70 | HR 71 | Temp 97.8°F | Resp 12 | Ht 63.0 in | Wt 177.0 lb

## 2017-12-27 DIAGNOSIS — Z1211 Encounter for screening for malignant neoplasm of colon: Secondary | ICD-10-CM | POA: Diagnosis present

## 2017-12-27 DIAGNOSIS — Z1212 Encounter for screening for malignant neoplasm of rectum: Secondary | ICD-10-CM

## 2017-12-27 MED ORDER — SODIUM CHLORIDE 0.9 % IV SOLN: 500.0000 mL | Freq: Once | INTRAVENOUS | Status: DC

## 2017-12-27 NOTE — Patient Instructions (Signed)
YOU HAD AN ENDOSCOPIC PROCEDURE TODAY AT THE [facility]:   Refer to the procedure report that was given to you for any specific questions about what was found during the examination.  If the procedure report does not answer your questions, please call your gastroenterologist to clarify.  If you requested that your care partner not be given the details of your procedure findings, then the procedure report has been included in a sealed envelope for you to review at your convenience later.  YOU SHOULD EXPECT: Some feelings of bloating in the abdomen. Passage of more gas than usual.  Walking can help get rid of the air that was put into your GI tract during the procedure and reduce the bloating. If you had a lower endoscopy (such as a colonoscopy or flexible sigmoidoscopy) you may notice spotting of blood in your stool or on the toilet paper. If you underwent a bowel prep for your procedure, you may not have a normal bowel movement for a few days.  Please Note:  You might notice some irritation and congestion in your nose or some drainage.  This is from the oxygen used during your procedure.  There is no need for concern and it should clear up in a day or so.  SYMPTOMS TO REPORT IMMEDIATELY:   Following lower endoscopy (colonoscopy or flexible sigmoidoscopy):  Excessive amounts of blood in the stool  Significant tenderness or worsening of abdominal pains  Swelling of the abdomen that is new, acute  Fever of 100F or higher  For urgent or emergent issues, a gastroenterologist can be reached at any hour by calling [phone].   DIET:  We do recommend a small meal at first, but then you may proceed to your regular diet.  Drink plenty of fluids but you should avoid alcoholic beverages for 24 hours.  ACTIVITY:  You should plan to take it easy for the rest of today and you should NOT DRIVE or use heavy machinery until tomorrow (because of the sedation medicines used during the test).     FOLLOW UP: Our staff will call the number listed on your records the next business day following your procedure to check on you and address any questions or concerns that you may have regarding the information given to you following your procedure. If we do not reach you, we will leave a message.  However, if you are feeling well and you are not experiencing any problems, there is no need to return our call.  We will assume that you have returned to your regular daily activities without incident.  SIGNATURES/CONFIDENTIALITY: You and/or your care partner have signed paperwork which will be entered into your electronic medical record.  These signatures attest to the fact that that the information above on your After Visit Summary has been reviewed and is understood.  Full responsibility of the confidentiality of this discharge information lies with you and/or your care-partner.  Please read over handouts about hemorrhoids and high fiber diets  Continue your normal medications  Next colonoscopy- 10 years

## 2017-12-27 NOTE — Op Note (Signed)
[facility] Patient Name: Tamara Hughes Procedure Date: 12/27/2017 12:11 PM MRN: [id number] Endoscopist: Wilhemina Bonito. Marina Goodell , MD Age: 67 Referring MD:  Date of Birth: 11-10-1951 Gender: Female Account #: [id number] Procedure:                Colonoscopy Indications:              Screening for colorectal malignant neoplasm.                            Negative index exam 2008 Medicines:                Monitored Anesthesia Care Procedure:                Pre-Anesthesia Assessment:                           - Prior to the procedure, a History and Physical                            was performed, and patient medications and                            allergies were reviewed. The patient's tolerance of                            previous anesthesia was also reviewed. The risks                            and benefits of the procedure and the sedation                            options and risks were discussed with the patient.                            All questions were answered, and informed consent                            was obtained. Prior Anticoagulants: The patient has                            taken no previous anticoagulant or antiplatelet                            agents. ASA Grade Assessment: II - A patient with                            mild systemic disease. After reviewing the risks                            and benefits, the patient was deemed in                            satisfactory condition to undergo the procedure.  After obtaining informed consent, the colonoscope                            was passed under direct vision. Throughout the                            procedure, the patient's blood pressure, pulse, and                            oxygen saturations were monitored continuously. The                            Colonoscope was introduced through the anus and                            advanced to the the cecum, identified by                         appendiceal orifice and ileocecal valve. The                            ileocecal valve, appendiceal orifice, and rectum                            were photographed. The quality of the bowel                            preparation was excellent. The colonoscopy was                            performed without difficulty. The patient tolerated                            the procedure well. The bowel preparation used was                            SUPREP. Scope In: 12:16:44 PM Scope Out: 12:31:10 PM Scope Withdrawal Time: 0 hours 10 minutes 28 seconds  Total Procedure Duration: 0 hours 14 minutes 26 seconds  Findings:                 Internal hemorrhoids were found during retroflexion.                           The entire examined colon appeared normal on direct                            and retroflexion views. Complications:            No immediate complications. Estimated blood loss:                            None. Estimated Blood Loss:     Estimated blood loss: none. Impression:               - Internal hemorrhoids.                           -  The entire examined colon is normal on direct and                            retroflexion views.                           - No specimens collected. Recommendation:           - Repeat colonoscopy in 10 years for screening                            purposes.                           - Patient has a contact number available for                            emergencies. The signs and symptoms of potential                            delayed complications were discussed with the                            patient. Return to normal activities tomorrow.                            Written discharge instructions were provided to the                            patient.                           - Resume previous diet.                           - Continue present medications. Wilhemina BonitoJohn N. Marina GoodellPerry, MD 12/27/2017 12:39:59 PM This report has  been signed electronically.

## 2017-12-27 NOTE — Progress Notes (Signed)
To recxovery, report to RN, VSS 

## 2017-12-27 NOTE — Progress Notes (Signed)
Pt's states no medical or surgical changes since previsit or office visit. 

## 2017-12-28 ENCOUNTER — Telehealth: Payer: Self-pay

## 2017-12-28 NOTE — Telephone Encounter (Signed)
  Follow up Call-  Call back number 12/27/2017  Post procedure Call Back phone  # [phone]  Permission to leave phone message Yes  Some recent data might be hidden     Patient questions:  Do you have a fever, pain , or abdominal swelling? No. Pain Score  0 *  Have you tolerated food without any problems? Yes.    Have you been able to return to your normal activities? Yes.    Do you have any questions about your discharge instructions: Diet   No. Medications  No. Follow up visit  No.  Do you have questions or concerns about your Care? No.  Actions: * If pain score is 4 or above: No action needed, pain <4.

## 2018-03-22 ENCOUNTER — Encounter: Payer: Self-pay | Admitting: Adult Health

## 2018-03-22 ENCOUNTER — Ambulatory Visit: Payer: Medicare Other | Admitting: Adult Health

## 2018-03-22 VITALS — BP 150/80 | Temp 98.4°F | Wt 174.0 lb

## 2018-03-22 DIAGNOSIS — J302 Other seasonal allergic rhinitis: Secondary | ICD-10-CM

## 2018-03-22 NOTE — Progress Notes (Signed)
Subjective:    Patient ID: Tamara Hughes, female    DOB: [birth year], 67 y.o.   MRN: [id number]  Sinusitis  This is a new problem. The problem has been gradually improving since onset. There has been no fever. Associated symptoms include congestion, ear pain, headaches, sinus pressure and sneezing. Pertinent negatives include no chills, coughing, diaphoresis, shortness of breath, sore throat or swollen glands. Treatments tried: Afrin and Flonase  The treatment provided mild relief.    Review of Systems  Constitutional: Negative.  Negative for chills and diaphoresis.  HENT: Positive for congestion, ear pain, postnasal drip, sinus pressure, sinus pain and sneezing. Negative for rhinorrhea and sore throat.   Eyes: Positive for discharge and itching.  Respiratory: Negative for cough and shortness of breath.   Cardiovascular: Negative.   Neurological: Positive for headaches.  All other systems reviewed and are negative.   Past Medical History:  Diagnosis Date  . Allergy   . Anxiety   . Arthritis   . Depression   . Family history of breast cancer   . Hyperlipidemia     Social History   Socioeconomic History  . Marital status: Single    Spouse name: Not on file  . Number of children: 1  . Years of education: Not on file  . Highest education level: Not on file  Occupational History  . Not on file  Social Needs  . Financial resource strain: Not on file  . Food insecurity:    Worry: Not on file    Inability: Not on file  . Transportation needs:    Medical: Not on file    Non-medical: Not on file  Tobacco Use  . Smoking status: Former Smoker    Packs/day: 1.00    Years: 15.00    Pack years: 15.00    Types: Cigarettes    Last attempt to quit: 04/21/1983    Years since quitting: 34.9  . Smokeless tobacco: Never Used  Substance and Sexual Activity  . Alcohol use: Yes    Comment: socially  . Drug use: No  . Sexual activity: Not on file  Lifestyle  . Physical activity:   Days per week: Not on file    Minutes per session: Not on file  . Stress: Not on file  Relationships  . Social connections:    Talks on phone: Not on file    Gets together: Not on file    Attends religious service: Not on file    Active member of club or organization: Not on file    Attends meetings of clubs or organizations: Not on file    Relationship status: Not on file  . Intimate partner violence:    Fear of current or ex partner: Not on file    Emotionally abused: Not on file    Physically abused: Not on file    Forced sexual activity: Not on file  Other Topics Concern  . Not on file  Social History Narrative  . Not on file    Past Surgical History:  Procedure Laterality Date  . GUM SURGERY    . HYSTEROSCOPY    . KNEE SURGERY Right     Family History  Problem Relation Age of Onset  . Breast cancer Mother 7460  . Bone cancer Father 1192  . Breast cancer Sister 5361  . Heart attack Sister   . Breast cancer Maternal Aunt        dx >50  . Cancer Maternal Aunt  NOS  . Breast cancer Other        MGF's sister #1  . Breast cancer Other        MGF's sister #2  . Hyperlipidemia Other   . Hypertension Other   . Thyroid disease Other   . Stomach cancer Neg Hx   . Rectal cancer Neg Hx   . Pancreatic cancer Neg Hx   . Colon cancer Neg Hx     Allergies  Allergen Reactions  . Doxycycline     REACTION: rash  . Ether   . Indomethacin     REACTION: upset stomach  . Penicillins     REACTION: Hives    Current Outpatient Medications on File Prior to Visit  Medication Sig Dispense Refill  . aspirin 81 MG tablet Take 81 mg by mouth daily.      . citalopram (CELEXA) 40 MG tablet Take 1 tablet (40 mg total) 1 day or 1 dose by mouth. 100 tablet 3  . fluticasone (FLONASE) 50 MCG/ACT nasal spray INHALE 2 SPRAYS INTO BOTH NOSTRILS ONCE DAILY 16 g 6  . LORazepam (ATIVAN) 0.5 MG tablet take 1 tablet by mouth twice a day if needed 30 tablet 5  . OVER THE COUNTER MEDICATION  Place 2 drops into both eyes daily as needed (dry eyes).    . simvastatin (ZOCOR) 40 MG tablet Take 1 tablet (40 mg total) by mouth at bedtime. 100 tablet 3   Current Facility-Administered Medications on File Prior to Visit  Medication Dose Route Frequency Provider Last Rate Last Dose  . 0.9 %  sodium chloride infusion  500 mL Intravenous Once Hilarie Fredrickson, MD        Temp 98.4 F (36.9 C) (Oral)   Wt 174 lb (78.9 kg)   BMI 30.82 kg/m       Objective:   Physical Exam  Constitutional: She is oriented to person, place, and time. She appears well-developed and well-nourished. No distress.  HENT:  Right Ear: Hearing, tympanic membrane, external ear and ear canal normal.  Left Ear: Hearing, tympanic membrane, external ear and ear canal normal.  Nose: Nose normal.  Mouth/Throat: Uvula is midline and mucous membranes are normal. Oropharyngeal exudate present.  Cardiovascular: Normal rate, regular rhythm, normal heart sounds and intact distal pulses. Exam reveals no gallop and no friction rub.  No murmur heard. Pulmonary/Chest: Effort normal and breath sounds normal. No respiratory distress. She has no wheezes. She has no rales. She exhibits no tenderness.  Neurological: She is alert and oriented to person, place, and time.  Skin: Skin is warm and dry. No rash noted. No erythema. No pallor.  Psychiatric: She has a normal mood and affect. Her behavior is normal. Thought content normal.  Nursing note and vitals reviewed.     Assessment & Plan:  1. Seasonal allergies - Continue with Flonase - ok to stop Afrin  - Follow up as needed  Shirline Frees, NP

## 2018-04-03 ENCOUNTER — Telehealth: Payer: Self-pay | Admitting: Family Medicine

## 2018-04-03 NOTE — Telephone Encounter (Signed)
Spoke with pt stated that she had a call from [facility] but no voicemail left, advised pt that there was no documentation left on her chart stating that a staff member from our practice had called pt.

## 2018-04-03 NOTE — Telephone Encounter (Signed)
Copied from CRM [id number]. Topic: Quick Communication - Office Called Patient >> Apr 03, 2018 11:59 AM Cecelia ByarsGreen, Temeka L, RMA wrote: Reason for CRM: patient is returning call she states someone from office called her, however there is no phone note or CRM, please return pt call

## 2018-07-30 ENCOUNTER — Other Ambulatory Visit: Payer: Self-pay | Admitting: Family Medicine

## 2018-07-30 DIAGNOSIS — Z1231 Encounter for screening mammogram for malignant neoplasm of breast: Secondary | ICD-10-CM

## 2018-08-28 ENCOUNTER — Ambulatory Visit
Admission: RE | Admit: 2018-08-28 | Discharge: 2018-08-28 | Disposition: A | Discharge status: Home / Self Care | Payer: Medicare Other | Source: Ambulatory Visit | Attending: Family Medicine | Admitting: Family Medicine

## 2018-08-28 DIAGNOSIS — Z1231 Encounter for screening mammogram for malignant neoplasm of breast: Secondary | ICD-10-CM

## 2018-09-16 ENCOUNTER — Ambulatory Visit (INDEPENDENT_AMBULATORY_CARE_PROVIDER_SITE_OTHER): Payer: Medicare Other | Admitting: Family Medicine

## 2018-09-16 ENCOUNTER — Encounter: Payer: Self-pay | Admitting: Family Medicine

## 2018-09-16 VITALS — BP 140/80 | HR 68 | Temp 98.3°F | Wt 177.6 lb

## 2018-09-16 DIAGNOSIS — I83893 Varicose veins of bilateral lower extremities with other complications: Secondary | ICD-10-CM | POA: Insufficient documentation

## 2018-09-16 DIAGNOSIS — Z23 Encounter for immunization: Secondary | ICD-10-CM

## 2018-09-16 DIAGNOSIS — F325 Major depressive disorder, single episode, in full remission: Secondary | ICD-10-CM | POA: Diagnosis not present

## 2018-09-16 DIAGNOSIS — Z0000 Encounter for general adult medical examination without abnormal findings: Secondary | ICD-10-CM

## 2018-09-16 DIAGNOSIS — F32A Depression, unspecified: Secondary | ICD-10-CM

## 2018-09-16 DIAGNOSIS — J3089 Other allergic rhinitis: Secondary | ICD-10-CM | POA: Diagnosis not present

## 2018-09-16 DIAGNOSIS — F329 Major depressive disorder, single episode, unspecified: Secondary | ICD-10-CM

## 2018-09-16 LAB — HEPATIC FUNCTION PANEL
ALT: 12 U/L (ref 0–35)
AST: 14 U/L (ref 0–37)
Albumin: 4.1 g/dL (ref 3.5–5.2)
Alkaline Phosphatase: 66 U/L (ref 39–117)
Bilirubin, Direct: 0.1 mg/dL (ref 0.0–0.3)
Total Bilirubin: 0.7 mg/dL (ref 0.2–1.2)
Total Protein: 6.6 g/dL (ref 6.0–8.3)

## 2018-09-16 LAB — POCT URINALYSIS DIPSTICK
Bilirubin, UA: NEGATIVE
Glucose, UA: NEGATIVE
Ketones, UA: NEGATIVE
NITRITE UA: NEGATIVE
PH UA: 7 (ref 5.0–8.0)
PROTEIN UA: NEGATIVE
RBC UA: NEGATIVE
Spec Grav, UA: 1.01 (ref 1.010–1.025)
UROBILINOGEN UA: 0.2 U/dL

## 2018-09-16 LAB — CBC WITH DIFFERENTIAL/PLATELET
Basophils Absolute: 0.1 K/uL (ref 0.0–0.1)
Basophils Relative: 1.1 % (ref 0.0–3.0)
Eosinophils Absolute: 0.1 K/uL (ref 0.0–0.7)
Eosinophils Relative: 2.5 % (ref 0.0–5.0)
HCT: 40 % (ref 36.0–46.0)
Hemoglobin: 12.8 g/dL (ref 12.0–15.0)
Lymphocytes Relative: 18.4 % (ref 12.0–46.0)
Lymphs Abs: 1.1 K/uL (ref 0.7–4.0)
MCHC: 32.1 g/dL (ref 30.0–36.0)
MCV: 80.6 fl (ref 78.0–100.0)
Monocytes Absolute: 0.6 K/uL (ref 0.1–1.0)
Monocytes Relative: 9.6 % (ref 3.0–12.0)
Neutro Abs: 4 K/uL (ref 1.4–7.7)
Neutrophils Relative %: 68.4 % (ref 43.0–77.0)
Platelets: 296 K/uL (ref 150.0–400.0)
RBC: 4.96 Mil/uL (ref 3.87–5.11)
RDW: 14.2 % (ref 11.5–15.5)
WBC: 5.9 K/uL (ref 4.0–10.5)

## 2018-09-16 LAB — LIPID PANEL
Cholesterol: 170 mg/dL (ref 0–200)
HDL: 47.1 mg/dL
LDL Cholesterol: 101 mg/dL — ABNORMAL HIGH (ref 0–99)
NonHDL: 122.97
Total CHOL/HDL Ratio: 4
Triglycerides: 108 mg/dL (ref 0.0–149.0)
VLDL: 21.6 mg/dL (ref 0.0–40.0)

## 2018-09-16 LAB — BASIC METABOLIC PANEL WITH GFR
BUN: 11 mg/dL (ref 6–23)
CO2: 31 meq/L (ref 19–32)
Calcium: 9 mg/dL (ref 8.4–10.5)
Chloride: 104 meq/L (ref 96–112)
Creatinine, Ser: 0.71 mg/dL (ref 0.40–1.20)
GFR: 104.97 mL/min
Glucose, Bld: 91 mg/dL (ref 70–99)
Potassium: 4 meq/L (ref 3.5–5.1)
Sodium: 141 meq/L (ref 135–145)

## 2018-09-16 LAB — TSH: TSH: 1.1 u[IU]/mL (ref 0.35–4.50)

## 2018-09-16 MED ORDER — LORAZEPAM 0.5 MG PO TABS: ORAL_TABLET | ORAL | 5 refills | Status: DC

## 2018-09-16 MED ORDER — SIMVASTATIN 40 MG PO TABS: 40.0000 mg | ORAL_TABLET | Freq: Every day | ORAL | 3 refills | Status: DC

## 2018-09-16 MED ORDER — CITALOPRAM HYDROBROMIDE 40 MG PO TABS: 40.0000 mg | ORAL_TABLET | ORAL | 3 refills | Status: DC

## 2018-09-16 MED ORDER — FLUTICASONE PROPIONATE 50 MCG/ACT NA SUSP: NASAL | 6 refills | Status: DC

## 2018-09-16 NOTE — Progress Notes (Signed)
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  For depression she takes Celexa 40 mg daily at bedtime. She also takes Ativan 0.5 twice a day 3 or 4 times a month for anxiety. She's taking care of her 69 year old mother who has recurrent hospitalizations.  She takes Zocor 40 mg daily at bedtime along with a baby aspirin because of a history of hyperlipidemia.  She gets routine eye care, dental care, BSE monthly, annual mammography, colonoscopy January 2019 was normal.  Vaccinations up-to-date information given on the shingles vaccine  14 point review of systems reviewed and otherwise negative  Social history........[name]. 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 Her son Christiane HaJonathan is due to get out of jail after being incarcerated for 18 years  BP 140/80 (BP Location: Right Arm, Patient Position: Sitting, Cuff Size: Large)   Pulse 68   Temp 98.3 F (36.8 C) (Oral)   Wt 177 lb 9.6 oz (80.6 kg)   SpO2 98%   BMI 31.46 kg/m  Well-developed well-nourished female no acute distress vital signs stable she's afebrile  Examination HEENT were negative neck was supple thyroid not enlarged no carotid bruits. Cardiopulmonary exam normal. Breast exam was normal. Abdominal exam was normal. Pelvic and rectal not indicated this year.......[name]. Due 2020... Extremities normal skin normal peripheral pulses normal except for bilateral lower extremity edema with bilateral varicose veins  #1 history of depression anxiety......... Continue Celexa and Ativan  #2 hyperlipidemia......... Continue Zocor and aspirin  #3bilateral varicose veins with peripheral edema...[name].[name].[name]. Avoid salt........ Walk 30 minutes daily........ Thigh-high stockings  #4 osteoarthritis right knee......... Continue follow-up with Dr. Cleophas [facility] when necessary

## 2018-09-16 NOTE — Patient Instructions (Signed)
Labs today........... We will call you if there is anything abnormal  I would recommend the thigh-high stockings.......... Information and referral given  Walk 30 minutes daily  For pain in your right knee would recommend Motrin 40 mg twice a day with food and elevation and ice 20 minutes before bedtime........ Ordered any other time that it's bothering her a lot.

## 2019-02-04 ENCOUNTER — Ambulatory Visit (INDEPENDENT_AMBULATORY_CARE_PROVIDER_SITE_OTHER): Payer: Medicare Other | Admitting: Orthopaedic Surgery

## 2019-02-04 ENCOUNTER — Encounter (INDEPENDENT_AMBULATORY_CARE_PROVIDER_SITE_OTHER): Payer: Self-pay | Admitting: Orthopaedic Surgery

## 2019-02-04 ENCOUNTER — Ambulatory Visit (INDEPENDENT_AMBULATORY_CARE_PROVIDER_SITE_OTHER): Payer: Self-pay

## 2019-02-04 VITALS — BP 151/94 | HR 71 | Ht 63.25 in | Wt 177.0 lb

## 2019-02-04 DIAGNOSIS — M25561 Pain in right knee: Secondary | ICD-10-CM

## 2019-02-04 MED ORDER — METHYLPREDNISOLONE ACETATE 40 MG/ML IJ SUSP
80.0000 mg | INTRAMUSCULAR | Status: AC | PRN
  Administered 2019-02-04: 80 mg

## 2019-02-04 MED ORDER — BUPIVACAINE HCL 0.5 % IJ SOLN
2.0000 mL | INTRAMUSCULAR | Status: AC | PRN
  Administered 2019-02-04: 2 mL via INTRA_ARTICULAR

## 2019-02-04 MED ORDER — LIDOCAINE HCL 1 % IJ SOLN
2.0000 mL | INTRAMUSCULAR | Status: AC | PRN
  Administered 2019-02-04: 2 mL

## 2019-02-04 NOTE — Progress Notes (Signed)
Office Visit Note   Patient: Tamara Hughes           Date of Birth: 07/19/1951           MRN: [id number] Visit Date: 02/04/2019              Requested by: Roderick Pee, MD No address on file PCP: Roderick Pee, MD   Assessment & Plan: Visit Diagnoses:  1. Acute pain of right knee     Plan: Acute osteoarthritis symptoms right knee.  Will inject with cortisone  Follow-Up Instructions: Return if symptoms worsen or fail to improve.   Orders:  Orders Placed This Encounter  Procedures  . Large Joint Inj: R knee  . XR KNEE 3 VIEW RIGHT   No orders of the defined types were placed in this encounter.     Procedures: Large Joint Inj: R knee on 02/04/2019 5:36 PM Indications: pain and diagnostic evaluation Details: 25 G 1.5 in needle, anteromedial approach  Arthrogram: No  Medications: 2 mL lidocaine 1 %; 2 mL bupivacaine 0.5 %; 80 mg methylPREDNISolone acetate 40 MG/ML Procedure, treatment alternatives, risks and benefits explained, specific risks discussed. Consent was given by the patient. Immediately prior to procedure a time out was called to verify the correct patient, procedure, equipment, support staff and site/side marked as required. Patient was prepped and draped in the usual sterile fashion.       Clinical Data: No additional findings.   Subjective: Chief Complaint  Patient presents with  . Right Leg - Pain  Patient has been having pain in her right leg for 2 days. She said that she was walking her dog on a leash and he pulled her hard. She stepped forward hard and wonders if that caused her leg pain. She is currently in no pain at all, but it was waking her last night. She has not taken anything for pain except Ibuprofen.   HPI  Review of Systems  Constitutional: Negative for fatigue.  HENT: Negative for ear pain.   Eyes: Negative for pain.  Respiratory: Negative for shortness of breath.   Cardiovascular: Positive for leg swelling.    Gastrointestinal: Negative for constipation and diarrhea.  Endocrine: Negative for cold intolerance and heat intolerance.  Genitourinary: Negative for difficulty urinating.  Musculoskeletal: Positive for joint swelling.  Skin: Negative for rash.  Allergic/Immunologic: Negative for food allergies.  Neurological: Negative for weakness.  Hematological: Does not bruise/bleed easily.  Psychiatric/Behavioral: Positive for sleep disturbance.     Objective: Vital Signs: BP (!) 151/94   Pulse 71   Ht 5' 3.25" (1.607 m)   Wt 177 lb (80.3 kg)   BMI 31.11 kg/m   Physical Exam Constitutional:      Appearance: She is well-developed.  Eyes:     Pupils: Pupils are equal, round, and reactive to light.  Pulmonary:     Effort: Pulmonary effort is normal.  Skin:    General: Skin is warm and dry.  Neurological:     Mental Status: She is alert and oriented to person, place, and time.  Psychiatric:        Behavior: Behavior normal.     Ortho Exam right knee with small effusion.  Predominately medial joint pain.  Lacks a few degrees to full extension with flexed over 105 degrees without instability.  No popliteal pain or mass.  No calf pain.  Neurovascular exam intact  Specialty Comments:  No specialty comments available.  Imaging: Xr Knee 3  View Right  Result Date: 02/04/2019 Films of the right knee reveal considerable tricompartmental degenerative changes.  There is about 2 degrees of varus and narrowing of the medial joint space.  There is subchondral sclerosis and irregularity of the joint space associated with peripheral osteophytes.  Also has arthritic changes in the lateral compartment and patellofemoral joint.  Films are consistent with advanced osteoarthritis    PMFS History: Patient Active Problem List   Diagnosis Date Noted  . Varicose veins of both legs with edema 09/16/2018  . Depression, major, single episode, complete remission ([facility]CC) 09/24/2017  . Routine general medical  examination at a health care facility 09/24/2017  . Family history of breast cancer   . Allergic rhinitis 07/04/2007   Past Medical History:  Diagnosis Date  . Allergy   . Anxiety   . Arthritis   . Depression   . Family history of breast cancer   . Hyperlipidemia     Family History  Problem Relation Age of Onset  . Breast cancer Mother 29  . Bone cancer Father 107  . Breast cancer Sister 63  . Heart attack Sister   . Breast cancer Maternal Aunt        dx >50  . Cancer Maternal Aunt        NOS  . Breast cancer Other        MGF's sister #1  . Breast cancer Other        MGF's sister #2  . Hyperlipidemia Other   . Hypertension Other   . Thyroid disease Other   . Stomach cancer Neg Hx   . Rectal cancer Neg Hx   . Pancreatic cancer Neg Hx   . Colon cancer Neg Hx     Past Surgical History:  Procedure Laterality Date  . GUM SURGERY    . HYSTEROSCOPY    . KNEE SURGERY Right    Social History   Occupational History  . Not on file  Tobacco Use  . Smoking status: Former Smoker    Packs/day: 1.00    Years: 15.00    Pack years: 15.00    Types: Cigarettes    Last attempt to quit: 04/21/1983    Years since quitting: 35.8  . Smokeless tobacco: Never Used  Substance and Sexual Activity  . Alcohol use: Yes    Comment: socially  . Drug use: No  . Sexual activity: Not on file

## 2019-02-23 ENCOUNTER — Encounter: Payer: Self-pay | Admitting: Adult Health

## 2019-07-01 ENCOUNTER — Encounter: Payer: Self-pay | Admitting: Adult Health

## 2019-07-01 ENCOUNTER — Other Ambulatory Visit: Payer: Self-pay

## 2019-07-01 ENCOUNTER — Ambulatory Visit (INDEPENDENT_AMBULATORY_CARE_PROVIDER_SITE_OTHER): Payer: Medicare Other | Admitting: Adult Health

## 2019-07-01 DIAGNOSIS — F325 Major depressive disorder, single episode, in full remission: Secondary | ICD-10-CM | POA: Diagnosis not present

## 2019-07-01 DIAGNOSIS — E785 Hyperlipidemia, unspecified: Secondary | ICD-10-CM

## 2019-07-01 DIAGNOSIS — I739 Peripheral vascular disease, unspecified: Secondary | ICD-10-CM

## 2019-07-01 DIAGNOSIS — Z0000 Encounter for general adult medical examination without abnormal findings: Secondary | ICD-10-CM

## 2019-07-01 NOTE — Progress Notes (Signed)
Virtual Visit via Video Note  I connected withJean E Hughes on 07/01/19 at  1:00 PM EDT by a video enabled telemedicine application and verified that I am speaking with the correct person using two identifiers.  Location patient: home Location provider:work or home office Persons participating in the virtual visit: patient, provider  I discussed the limitations of evaluation and management by telemedicine and the availability of in person appointments. The patient expressed understanding and agreed to proceed.  Establish Care visit. She is a former patient of Dr. Tawanna Coolerodd. Her last CPE was in September 2019   Acute Concerns: Establish Care   Right Leg cramping -has been an intermittent issue since around February 2020.  She was originally seen by orthopedics as it was thought it was possibly due to osteoarthritis of right knee.  She received a knee injection.  Despite this she continues to complain of intermittent cramping throughout her right leg.  This is worse with walking.  Patient reports that at times she can only walk a short distance before her right leg starts to cramp.  She has not noticed any discoloration of her right lower extremity.  Does have varicose veins on that leg. Does wear compression socks at times but finds them uncomfortable during the heat   Chronic Issues: Depression/ Anxiety - Takes Celexa 40 mg QHS. She also takes Ativan 0.5 mg BID PRN.  Hyperlipidemia - Takes Zocor 40 mg QHS and baby ASA Lab Results  Component Value Date   CHOL 170 09/16/2018   HDL 47.10 09/16/2018   LDLCALC 101 (H) 09/16/2018   TRIG 108.0 09/16/2018   CHOLHDL 4 09/16/2018    Health Maintenance: Dental -- Routine Vision -- Routine  Immunizations -- UTD Colonoscopy -- UTD Mammogram -- UTD PAP -- No longer needs Bone Density -- No longer needs    Past Medical History:  Diagnosis Date  . Allergy   . Anxiety   . Arthritis   . Depression   . Family history of breast cancer   .  Hyperlipidemia     Past Surgical History:  Procedure Laterality Date  . GUM SURGERY    . HYSTEROSCOPY    . KNEE SURGERY Right     Current Outpatient Medications on File Prior to Visit  Medication Sig Dispense Refill  . aspirin 81 MG tablet Take 81 mg by mouth daily.      . citalopram (CELEXA) 40 MG tablet Take 1 tablet (40 mg total) by mouth 1 day or 1 dose. 100 tablet 3  . fluticasone (FLONASE) 50 MCG/ACT nasal spray INHALE 2 SPRAYS INTO BOTH NOSTRILS ONCE DAILY 16 g 6  . LORazepam (ATIVAN) 0.5 MG tablet take 1 tablet by mouth twice a day if needed 40 tablet 5  . OVER THE COUNTER MEDICATION Place 2 drops into both eyes daily as needed (dry eyes).    . RESTASIS 0.05 % ophthalmic emulsion INT 1 DROP AEY BID  3  . simvastatin (ZOCOR) 40 MG tablet Take 1 tablet (40 mg total) by mouth at bedtime. 100 tablet 3   Current Facility-Administered Medications on File Prior to Visit  Medication Dose Route Frequency Provider Last Rate Last Dose  . 0.9 %  sodium chloride infusion  500 mL Intravenous Once Hilarie FredricksonPerry, John N, MD        Allergies  Allergen Reactions  . Doxycycline     REACTION: rash  . Ether   . Indomethacin     REACTION: upset stomach  .  Penicillins     REACTION: Hives    Family History  Problem Relation Age of Onset  . Breast cancer Mother 1960  . Bone cancer Father 1492  . Breast cancer Sister 1961  . Heart attack Sister   . Breast cancer Maternal Aunt        dx >50  . Cancer Maternal Aunt        NOS  . Breast cancer Other        MGF's sister #1  . Breast cancer Other        MGF's sister #2  . Hyperlipidemia Other   . Hypertension Other   . Thyroid disease Other   . Stomach cancer Neg Hx   . Rectal cancer Neg Hx   . Pancreatic cancer Neg Hx   . Colon cancer Neg Hx     Social History   Socioeconomic History  . Marital status: Single    Spouse name: Not on file  . Number of children: 1  . Years of education: Not on file  . Highest education level: Not on file   Occupational History  . Not on file  Social Needs  . Financial resource strain: Not on file  . Food insecurity    Worry: Not on file    Inability: Not on file  . Transportation needs    Medical: Not on file    Non-medical: Not on file  Tobacco Use  . Smoking status: Former Smoker    Packs/day: 1.00    Years: 15.00    Pack years: 15.00    Types: Cigarettes    Quit date: 04/21/1983    Years since quitting: 36.2  . Smokeless tobacco: Never Used  Substance and Sexual Activity  . Alcohol use: Yes    Comment: socially  . Drug use: No  . Sexual activity: Not on file  Lifestyle  . Physical activity    Days per week: Not on file    Minutes per session: Not on file  . Stress: Not on file  Relationships  . Social Musicianconnections    Talks on phone: Not on file    Gets together: Not on file    Attends religious service: Not on file    Active member of club or organization: Not on file    Attends meetings of clubs or organizations: Not on file    Relationship status: Not on file  . Intimate partner violence    Fear of current or ex partner: Not on file    Emotionally abused: Not on file    Physically abused: Not on file    Forced sexual activity: Not on file  Other Topics Concern  . Not on file  Social History Narrative  . Not on file    ROS  There were no vitals taken for this visit.  Physical Exam VITALS per patient if applicable:  GENERAL: alert, oriented, appears well and in no acute distress  HEENT: atraumatic, conjunttiva clear, no obvious abnormalities on inspection of external nose and ears  NECK: normal movements of the head and neck  LUNGS: on inspection no signs of respiratory distress, breathing rate appears normal, no obvious gross SOB, gasping or wheezing  CV: no obvious cyanosis  MS: moves all visible extremities without noticeable abnormality  PSYCH/NEURO: pleasant and cooperative, no obvious depression or anxiety, speech and thought processing grossly  intact  No results found for this or any previous visit (from the past 2160 hour(s)).  Assessment/Plan:  Discussed  the following assessment and plan:  1. Encounter for medical examination to establish care - Follow up in September for CPE or sooner if needed  2. Depression, major, single episode, complete remission ([place]) - Continue with Celexa and Ativan as needed  3. Hyperlipidemia, unspecified hyperlipidemia type - Continue with statin   4. Right leg claudication [facility])  - Ambulatory referral to Vascular Surgery   I discussed the assessment and treatment plan with the patient. The patient was provided an opportunity to ask questions and all were answered. The patient agreed with the plan and demonstrated an understanding of the instructions.   The patient was advised to call back or seek an in-person evaluation if the symptoms worsen or if the condition fails to improve as anticipated.   Dorothyann Peng, NP

## 2019-07-15 ENCOUNTER — Encounter: Payer: Self-pay | Admitting: Adult Health

## 2019-07-18 ENCOUNTER — Other Ambulatory Visit: Payer: Self-pay

## 2019-07-18 DIAGNOSIS — I739 Peripheral vascular disease, unspecified: Secondary | ICD-10-CM

## 2019-07-24 ENCOUNTER — Telehealth (HOSPITAL_COMMUNITY): Payer: Self-pay

## 2019-07-24 NOTE — Telephone Encounter (Signed)

## 2019-07-25 ENCOUNTER — Ambulatory Visit: Payer: Medicare Other | Admitting: Vascular Surgery

## 2019-07-25 ENCOUNTER — Encounter: Payer: Self-pay | Admitting: Vascular Surgery

## 2019-07-25 ENCOUNTER — Ambulatory Visit (HOSPITAL_COMMUNITY)
Admission: RE | Admit: 2019-07-25 | Discharge: 2019-07-25 | Disposition: A | Discharge status: Home / Self Care | Payer: Medicare Other | Source: Ambulatory Visit | Attending: Vascular Surgery | Admitting: Vascular Surgery

## 2019-07-25 ENCOUNTER — Other Ambulatory Visit: Payer: Self-pay

## 2019-07-25 VITALS — BP 131/78 | HR 70 | Temp 97.4°F | Resp 20 | Ht 63.0 in | Wt 169.0 lb

## 2019-07-25 DIAGNOSIS — M25561 Pain in right knee: Secondary | ICD-10-CM

## 2019-07-25 DIAGNOSIS — I739 Peripheral vascular disease, unspecified: Secondary | ICD-10-CM | POA: Diagnosis not present

## 2019-07-25 NOTE — Progress Notes (Signed)
Patient ID: Tamara Hughes, female   DOB: [birth year], 68 y.o.   MRN: [id number]  Reason for Consult: New Patient (Initial Visit)   Referred by Shirline FreesNafziger, Cory, NP  Subjective:     HPI:  Tamara Hughes is a 68 y.o. female with family history of abdominal aortic aneurysm her father having been followed by Dr. Arbie CookeyEarly in this office.  She more recently has right lower extremity pain.  She underwent injection in her knee where she had previously had arthroscopic surgery.  Injection did not relieve the pain at all.  States the pain is in her knee but also in her calf.  Does not occur with walking happens mostly at rest.  She does have varicose veins.  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  Does have some swelling associated varicose veins right leg none really on the left leg.  Does not have tissue loss or ulceration.  States that the knee and calf pain really limits her from walking at all.  Has never had vascular intervention.  She is a former smoker.  She does take aspirin and a statin drug.  Past Medical History:  Diagnosis Date  . Allergy   . Anxiety   . Arthritis   . Depression   . Family history of breast cancer   . Hyperlipidemia    Family History  Problem Relation Age of Onset  . Breast cancer Mother 7560  . Bone cancer Father 3892  . Breast cancer Sister 4361  . Heart attack Sister   . Diabetes Sister   . Breast cancer Maternal Aunt        dx >50  . Cancer Maternal Aunt        NOS  . Breast cancer Other        MGF's sister #1  . Hyperlipidemia Other   . Hypertension Other   . Thyroid disease Other   . Stomach cancer Neg Hx   . Rectal cancer Neg Hx   . Pancreatic cancer Neg Hx   . Colon cancer Neg Hx    Past Surgical History:  Procedure Laterality Date  . GUM SURGERY    . HYSTEROSCOPY    . KNEE SURGERY Right     Short Social History:  Social History   Tobacco Use  . Smoking status: Former Smoker    Packs/day: 1.00    Years: 15.00    Pack years: 15.00    Types:  Cigarettes    Quit date: 04/21/1983    Years since quitting: 36.2  . Smokeless tobacco: Never Used  Substance Use Topics  . Alcohol use: Yes    Comment: socially    Allergies  Allergen Reactions  . Doxycycline     REACTION: rash  . Ether   . Indomethacin     REACTION: upset stomach  . Penicillins     REACTION: Hives    Current Outpatient Medications  Medication Sig Dispense Refill  . aspirin 81 MG tablet Take 81 mg by mouth daily.      . citalopram (CELEXA) 40 MG tablet Take 1 tablet (40 mg total) by mouth 1 day or 1 dose. 100 tablet 3  . fluticasone (FLONASE) 50 MCG/ACT nasal spray INHALE 2 SPRAYS INTO BOTH NOSTRILS ONCE DAILY 16 g 6  . LORazepam (ATIVAN) 0.5 MG tablet take 1 tablet by mouth twice a day if needed 40 tablet 5  . OVER THE COUNTER MEDICATION Place 2 drops into both eyes daily as  needed (dry eyes).    . RESTASIS 0.05 % ophthalmic emulsion INT 1 DROP AEY BID  3  . simvastatin (ZOCOR) 40 MG tablet Take 1 tablet (40 mg total) by mouth at bedtime. 100 tablet 3   No current facility-administered medications for this visit.     Review of Systems  Constitutional:  Constitutional negative. HENT: HENT negative.  Eyes: Eyes negative.  Respiratory: Respiratory negative.  Cardiovascular: Positive for claudication.  GI: Gastrointestinal negative.  Musculoskeletal: Positive for gait problem and joint pain.  Hematologic: Hematologic/lymphatic negative.  Psychiatric: Psychiatric negative.        Objective:  Objective   Vitals:   07/25/19 1028  BP: 131/78  Pulse: 70  Resp: 20  Temp: (!) 97.4 F (36.3 C)  TempSrc: Temporal  SpO2: 98%  Weight: 169 lb (76.7 kg)  Height: 5\' 3"  (1.6 m)   Body mass index is 29.94 kg/m.  Physical Exam HENT:     Head: Normocephalic.     Mouth/Throat:     Mouth: Mucous membranes are moist.  Eyes:     Pupils: Pupils are equal, round, and reactive to light.  Neck:     Musculoskeletal: Neck supple.  Cardiovascular:     Rate  and Rhythm: Normal rate.     Pulses:          Radial pulses are 2+ on the right side and 2+ on the left side.       Popliteal pulses are 2+ on the right side and 2+ on the left side.       Dorsalis pedis pulses are 2+ on the right side and 2+ on the left side.  Pulmonary:     Effort: Pulmonary effort is normal.     Breath sounds: Normal breath sounds.  Abdominal:     General: Abdomen is flat.     Palpations: Abdomen is soft. There is no mass.  Musculoskeletal: Normal range of motion.        General: Swelling present.  Skin:    General: Skin is warm and dry.     Capillary Refill: Capillary refill takes less than 2 seconds.     Comments: Right thigh and posterior leg varicose veins  Neurological:     General: No focal deficit present.     Mental Status: She is alert.  Psychiatric:        Mood and Affect: Mood normal.        Behavior: Behavior normal.        Thought Content: Thought content normal.        Judgment: Judgment normal.     Data: I independently turbid her ABIs to be greater than 1 and triphasic bilaterally with toe pressure on the right 147 and left 162     Assessment/Plan:     68 year old female presents with right lower extremity pain.  ABIs are normal she has palpable pulses.  She says that the pain is cramping in nature but does have history of knee surgery and recent knee injection that did not help.  Pain is at rest does not seem to be arterial in nature.  She does have some varicosities on the right lower extremity with minimal swelling we will get venous studies.  She also has a family history of abdominal aortic aneurysm and a personal history of smoking and will get aortic aneurysm duplex to rule that out as well.  She demonstrates good understanding we will get her set for 14-month follow-up today  Cynthya Yam Christopher Regine Christian MD [facility]  

## 2019-08-05 ENCOUNTER — Other Ambulatory Visit: Payer: Self-pay | Admitting: Adult Health

## 2019-08-05 DIAGNOSIS — Z1231 Encounter for screening mammogram for malignant neoplasm of breast: Secondary | ICD-10-CM

## 2019-08-21 ENCOUNTER — Telehealth: Payer: Self-pay | Admitting: Vascular Surgery

## 2019-08-21 NOTE — Telephone Encounter (Signed)
left message to call the office and R/S her 10/31/19 phone appt with Dr. Donzetta Matters

## 2019-09-04 ENCOUNTER — Ambulatory Visit
Admission: RE | Admit: 2019-09-04 | Discharge: 2019-09-04 | Disposition: A | Discharge status: Home / Self Care | Payer: Medicare Other | Source: Ambulatory Visit | Attending: Adult Health | Admitting: Adult Health

## 2019-09-04 ENCOUNTER — Other Ambulatory Visit: Payer: Self-pay

## 2019-09-04 DIAGNOSIS — Z1231 Encounter for screening mammogram for malignant neoplasm of breast: Secondary | ICD-10-CM

## 2019-09-17 ENCOUNTER — Encounter: Payer: Self-pay | Admitting: Podiatry

## 2019-09-17 ENCOUNTER — Ambulatory Visit (INDEPENDENT_AMBULATORY_CARE_PROVIDER_SITE_OTHER): Payer: Medicare Other | Admitting: Podiatry

## 2019-09-17 ENCOUNTER — Other Ambulatory Visit: Payer: Self-pay

## 2019-09-17 VITALS — BP 156/90 | HR 73

## 2019-09-17 DIAGNOSIS — M2041 Other hammer toe(s) (acquired), right foot: Secondary | ICD-10-CM | POA: Diagnosis not present

## 2019-09-17 DIAGNOSIS — B351 Tinea unguium: Secondary | ICD-10-CM | POA: Diagnosis not present

## 2019-09-17 DIAGNOSIS — L84 Corns and callosities: Secondary | ICD-10-CM

## 2019-09-17 DIAGNOSIS — M79674 Pain in right toe(s): Secondary | ICD-10-CM | POA: Diagnosis not present

## 2019-09-17 DIAGNOSIS — M79675 Pain in left toe(s): Secondary | ICD-10-CM

## 2019-09-17 DIAGNOSIS — M2012 Hallux valgus (acquired), left foot: Secondary | ICD-10-CM

## 2019-09-17 DIAGNOSIS — M2011 Hallux valgus (acquired), right foot: Secondary | ICD-10-CM | POA: Diagnosis not present

## 2019-09-17 DIAGNOSIS — M2042 Other hammer toe(s) (acquired), left foot: Secondary | ICD-10-CM

## 2019-09-17 NOTE — Patient Instructions (Signed)

## 2019-09-23 ENCOUNTER — Encounter: Payer: Self-pay | Admitting: Podiatry

## 2019-09-23 NOTE — Progress Notes (Signed)
Subjective: Tamara Hughes presents today  with cc of painful, discolored, thick toenails for "years" which interfere with daily activities.  Pain is aggravated when wearing enclosed shoe gear.   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 Present on the right for 20 years and on the left foot for 10 years. She states she has been wearing soft shoes daily to alleviate the pain.   Past Medical History:  Diagnosis Date  . Allergy   . Anxiety   . Arthritis   . Depression   . Family history of breast cancer   . Hyperlipidemia      Patient Active Problem List   Diagnosis Date Noted  . Varicose veins of both legs with edema 09/16/2018  . Depression, major, single episode, complete remission (HCC) 09/24/2017  . Routine general medical examination at a health care facility 09/24/2017  . Bilateral temporomandibular joint pain 04/06/2016  . Disorder of left eustachian tube 04/06/2016  . Otalgia of both ears 04/06/2016  . Family history of breast cancer   . Allergic rhinitis 07/04/2007     Past Surgical History:  Procedure Laterality Date  . GUM SURGERY    . HYSTEROSCOPY    . KNEE SURGERY Right    Current Outpatient Medications on File Prior to Visit  Medication Sig  . aspirin 81 MG tablet Take 81 mg by mouth daily.    . citalopram (CELEXA) 40 MG tablet Take 1 tablet (40 mg total) by mouth 1 day or 1 dose.  . fluticasone (FLONASE) 50 MCG/ACT nasal spray INHALE 2 SPRAYS INTO BOTH NOSTRILS ONCE DAILY  . LORazepam (ATIVAN) 0.5 MG tablet take 1 tablet by mouth twice a day if needed  . OVER THE COUNTER MEDICATION Place 2 drops into both eyes daily as needed (dry eyes).  . RESTASIS 0.05 % ophthalmic emulsion INT 1 DROP AEY BID  . simvastatin (ZOCOR) 40 MG tablet Take 1 tablet (40 mg total) by mouth at bedtime.   No current facility-administered medications on file prior to visit.     Allergies  Allergen Reactions  . Doxycycline     REACTION: rash  . Ether   . Indomethacin    REACTION: upset stomach  . Penicillins     REACTION: Hives     Social History   Occupational History  . Not on file  Tobacco Use  . Smoking status: Former Smoker    Packs/day: 1.00    Years: 15.00    Pack years: 15.00    Types: Cigarettes    Quit date: 04/21/1983    Years since quitting: 36.4  . Smokeless tobacco: Never Used  Substance and Sexual Activity  . Alcohol use: Yes    Comment: socially  . Drug use: No  . Sexual activity: Not on file     Family History  Problem Relation Age of Onset  . Breast cancer Mother 70  . Bone cancer Father 44  . Breast cancer Sister 17  . Heart attack Sister   . Diabetes Sister   . Breast cancer Maternal Aunt        dx >50  . Cancer Maternal Aunt        NOS  . Breast cancer Other        MGF's sister #1  . Hyperlipidemia Other   . Hypertension Other   . Thyroid disease Other   . Stomach cancer Neg Hx   . Rectal cancer Neg Hx   . Pancreatic cancer Neg Hx   .  Colon cancer Neg Hx      Immunization History  Administered Date(s) Administered  . Influenza Split 09/19/2012  . Influenza, High Dose Seasonal PF 09/28/2015, 09/13/2016, 09/24/2017  . Influenza,inj,Quad PF,6+ Mos 09/24/2014, 09/16/2018  . Pneumococcal Conjugate-13 09/24/2017  . Td 12/25/2004, 09/28/2015     Review of systems: Positive Findings in bold print.  Constitutional:  chills, fatigue, fever, sweats, weight change Communication: Optometrist, sign Ecologist, hand writing, iPad/Android device Head: headaches, head injury Eyes: changes in vision, eye pain, glaucoma, cataracts, macular degeneration, diplopia, glare,  light sensitivity, eyeglasses or contacts, blindness Ears nose mouth throat: hearing impaired, hearing aids,  ringing in ears, deaf, sign language,  vertigo,   nosebleeds,  rhinitis,  cold sores, snoring, swollen glands Cardiovascular: HTN, edema, arrhythmia, pacemaker in place, defibrillator in place, chest pain/tightness, chronic  anticoagulation, blood clot, heart failure, MI Peripheral Vascular: leg cramps, varicose veins, blood clots, lymphedema, varicosities Respiratory:  difficulty breathing, denies congestion, SOB, wheezing, cough, emphysema Gastrointestinal: change in appetite or weight, abdominal pain, constipation, diarrhea, nausea, vomiting, vomiting blood, change in bowel habits, abdominal pain, jaundice, rectal bleeding, hemorrhoids, GERD Genitourinary:  nocturia,  pain on urination, polyuria,  blood in urine, Foley catheter, urinary urgency, ESRD on hemodialysis Musculoskeletal: amputation, cramping, stiff joints, painful joints, decreased joint motion, fractures, OA, gout, hemiplegia, paraplegia, uses cane, wheelchair bound, uses walker, uses rollator Skin: +changes in toenails, color change, dryness, itching, mole changes,  rash, wound(s) Neurological: headaches, numbness in feet, paresthesias in feet, burning in feet, fainting,  seizures, change in speech. denies headaches, memory problems/poor historian, cerebral palsy, weakness, paralysis, CVA, TIA Endocrine: diabetes, hypothyroidism, hyperthyroidism,  goiter, dry mouth, flushing, heat intolerance,  cold intolerance,  excessive thirst, denies polyuria,  nocturia Hematological:  easy bleeding, excessive bleeding, easy bruising, enlarged lymph nodes, on long term blood thinner, history of past transusions Allergy/immunological:  hives, eczema, frequent infections, multiple drug allergies, seasonal allergies, transplant recipient, multiple food allergies Psychiatric:  anxiety, depression, mood disorder, suicidal ideations, hallucinations, insomnia  Objective: Vitals:   09/17/19 1417  BP: (!) 156/90  Pulse: 73  Vascular Examination: Capillary refill time immediate x 10 digits.  Dorsalis pedis present b/l.  Posterior tibial pulses present b/l.  Digital hair  present x 10 digits.  Skin temperature gradient WNL b/l.  Dermatological Examination: Skin  with normal turgor, texture and tone b/l.  Toenails 1-5 b/l discolored, thick, dystrophic with subungual debris and pain with palpation to nailbeds due to thickness of nails.  Hyperkeratotic lesion noted dorsal 5th digit PIPJ with tenderness to palpation. No edema, no erythema, no drainage, no flocculence.  Musculoskeletal: Muscle strength 5/5 to all LE muscle groups.  HAV with bunion deformity b/l.  Hammertoe b/l 5th digit  Neurological: Sensation intact with 10 gram monofilament.  Vibratory sensation intact.  Assessment: 1. Painful onychomycosis toenails 1-5 b/l  2. Corns b/l 5th digits 3. HAV with bunion b/l 4. Hammertoes b/l 5th digits  Plan: 1. Discussed onychomycosis and treatment options.  Literature dispensed on today. She will think about laser treatment.  2. Toenails 1-5 b/l were debrided in length and girth without iatrogenic bleeding. 3. Discussed conservative vs surgical treatment options for hammertoe deformity. 4. Corn(s) pared b/l 5th digits utilizing sterile scalpel blade without incident.  5. Patient to continue soft, supportive shoe gear daily. 6. Patient to report any pedal injuries to medical professional immediately. 7. Follow up 3 months.  8. Patient/POA to call should there be a concern in the interim.

## 2019-09-24 ENCOUNTER — Ambulatory Visit (INDEPENDENT_AMBULATORY_CARE_PROVIDER_SITE_OTHER): Payer: Medicare Other | Admitting: Adult Health

## 2019-09-24 ENCOUNTER — Other Ambulatory Visit: Payer: Self-pay

## 2019-09-24 ENCOUNTER — Encounter: Payer: Self-pay | Admitting: Adult Health

## 2019-09-24 VITALS — BP 146/90 | Temp 97.5°F | Ht 64.0 in | Wt 171.0 lb

## 2019-09-24 DIAGNOSIS — E785 Hyperlipidemia, unspecified: Secondary | ICD-10-CM | POA: Diagnosis not present

## 2019-09-24 DIAGNOSIS — Z23 Encounter for immunization: Secondary | ICD-10-CM

## 2019-09-24 DIAGNOSIS — F329 Major depressive disorder, single episode, unspecified: Secondary | ICD-10-CM | POA: Diagnosis not present

## 2019-09-24 DIAGNOSIS — E2839 Other primary ovarian failure: Secondary | ICD-10-CM

## 2019-09-24 DIAGNOSIS — Z0000 Encounter for general adult medical examination without abnormal findings: Secondary | ICD-10-CM | POA: Diagnosis not present

## 2019-09-24 LAB — COMPREHENSIVE METABOLIC PANEL
ALT: 13 U/L (ref 0–35)
AST: 15 U/L (ref 0–37)
Albumin: 4 g/dL (ref 3.5–5.2)
Alkaline Phosphatase: 66 U/L (ref 39–117)
BUN: 10 mg/dL (ref 6–23)
CO2: 31 mEq/L (ref 19–32)
Calcium: 9.2 mg/dL (ref 8.4–10.5)
Chloride: 106 mEq/L (ref 96–112)
Creatinine, Ser: 0.65 mg/dL (ref 0.40–1.20)
GFR: 109.03 mL/min (ref >60.00)
Glucose, Bld: 75 mg/dL (ref 70–99)
Potassium: 4.3 mEq/L (ref 3.5–5.1)
Sodium: 144 mEq/L (ref 135–145)
Total Bilirubin: 0.6 mg/dL (ref 0.2–1.2)
Total Protein: 6.5 g/dL (ref 6.0–8.3)

## 2019-09-24 LAB — CBC WITH DIFFERENTIAL/PLATELET
Basophils Absolute: 0.1 10*3/uL (ref 0.0–0.1)
Basophils Relative: 1.3 % (ref 0.0–3.0)
Eosinophils Absolute: 0.2 10*3/uL (ref 0.0–0.7)
Eosinophils Relative: 2.8 % (ref 0.0–5.0)
HCT: 40.4 % (ref 36.0–46.0)
Hemoglobin: 12.7 g/dL (ref 12.0–15.0)
Lymphocytes Relative: 20.3 % (ref 12.0–46.0)
Lymphs Abs: 1.3 10*3/uL (ref 0.7–4.0)
MCHC: 31.5 g/dL (ref 30.0–36.0)
MCV: 82 fl (ref 78.0–100.0)
Monocytes Absolute: 0.6 10*3/uL (ref 0.1–1.0)
Monocytes Relative: 10.2 % (ref 3.0–12.0)
Neutro Abs: 4.1 10*3/uL (ref 1.4–7.7)
Neutrophils Relative %: 65.4 % (ref 43.0–77.0)
Platelets: 293 10*3/uL (ref 150.0–400.0)
RBC: 4.92 Mil/uL (ref 3.87–5.11)
RDW: 14.9 % (ref 11.5–15.5)
WBC: 6.2 10*3/uL (ref 4.0–10.5)

## 2019-09-24 LAB — TSH: TSH: 2.06 u[IU]/mL (ref 0.35–4.50)

## 2019-09-24 LAB — LIPID PANEL
Cholesterol: 158 mg/dL (ref 0–200)
HDL: 53.2 mg/dL (ref >39.00)
LDL Cholesterol: 83 mg/dL (ref 0–99)
NonHDL: 104.84
Total CHOL/HDL Ratio: 3
Triglycerides: 111 mg/dL (ref 0.0–149.0)
VLDL: 22.2 mg/dL (ref 0.0–40.0)

## 2019-09-24 MED ORDER — SIMVASTATIN 40 MG PO TABS: 40.0000 mg | ORAL_TABLET | Freq: Every day | ORAL | 3 refills | Status: DC

## 2019-09-24 MED ORDER — CITALOPRAM HYDROBROMIDE 40 MG PO TABS: 40.0000 mg | ORAL_TABLET | Freq: Every day | ORAL | 1 refills | Status: DC

## 2019-09-24 NOTE — Patient Instructions (Signed)
It was great seeing you today   We will follow up with you regarding your blood work   You can stop by the check out desk and schedule your bone density screen

## 2019-09-24 NOTE — Progress Notes (Signed)
Subjective:    Patient ID: Tamara CanavanJean E Hughes, female    DOB: 05-May-1951, 68 y.o.   MRN: [id number]  HPI  Patient presents for yearly preventative medicine examination. She is a pleasant 68 year old female who  has a past medical history of Allergy, Anxiety, Arthritis, Depression, Family history of breast cancer, and Hyperlipidemia.  Anxiety/Depression -currently prescribed Celexa 40 mg nightly and Ativan 0.5 mg twice daily as needed.  She does feel well controlled  Hyperlipidemia-takes Zocor 40 mg nightly and a baby aspirin.  She denies myalgias or fatigue. Lab Results  Component Value Date   CHOL 170 09/16/2018   HDL 47.10 09/16/2018   LDLCALC 101 (H) 09/16/2018   TRIG 108.0 09/16/2018   CHOLHDL 4 09/16/2018   Elevated Blood Readings - she monitors at home and reports readings below 120/80's. BP usually goes up when she comes to medical appointments   All immunizations and health maintenance protocols were reviewed with the patient and needed orders were placed.  She is due for yearly influenza vaccination as well as Pneumovax.  Appropriate screening laboratory values were ordered for the patient including screening of hyperlipidemia, renal function and hepatic function.  Medication reconciliation,  past medical history, social history, problem list and allergies were reviewed in detail with the patient  Goals were established with regard to weight loss, exercise, and  diet in compliance with medications  Wt Readings from Last 3 Encounters:  09/24/19 171 lb (77.6 kg)  07/25/19 169 lb (76.7 kg)  02/04/19 177 lb (80.3 kg)   End of life planning was discussed.  She is up to date on routine screening colonoscopy, mammogram, dental and vision screens. She has her AAA screen scheduled for November.    Review of Systems  Constitutional: Negative.   HENT: Negative.   Eyes: Negative.   Respiratory: Negative.   Cardiovascular: Negative.   Gastrointestinal: Negative.   Endocrine:  Negative.   Genitourinary: Negative.   Musculoskeletal: Negative.   Skin: Negative.   Allergic/Immunologic: Negative.   Neurological: Negative.   Hematological: Negative.   Psychiatric/Behavioral: Negative.    Past Medical History:  Diagnosis Date  . Allergy   . Anxiety   . Arthritis   . Depression   . Family history of breast cancer   . Hyperlipidemia     Social History   Socioeconomic History  . Marital status: Single    Spouse name: Not on file  . Number of children: 1  . Years of education: Not on file  . Highest education level: Not on file  Occupational History  . Not on file  Social Needs  . Financial resource strain: Not on file  . Food insecurity    Worry: Not on file    Inability: Not on file  . Transportation needs    Medical: Not on file    Non-medical: Not on file  Tobacco Use  . Smoking status: Former Smoker    Packs/day: 1.00    Years: 15.00    Pack years: 15.00    Types: Cigarettes    Quit date: 04/21/1983    Years since quitting: 36.4  . Smokeless tobacco: Never Used  Substance and Sexual Activity  . Alcohol use: Yes    Comment: socially  . Drug use: No  . Sexual activity: Not on file  Lifestyle  . Physical activity    Days per week: Not on file    Minutes per session: Not on file  . Stress: Not on file  Relationships  . Social Herbalist on phone: Not on file    Gets together: Not on file    Attends religious service: Not on file    Active member of club or organization: Not on file    Attends meetings of clubs or organizations: Not on file    Relationship status: Not on file  . Intimate partner violence    Fear of current or ex partner: Not on file    Emotionally abused: Not on file    Physically abused: Not on file    Forced sexual activity: Not on file  Other Topics Concern  . Not on file  Social History Narrative  . Not on file    Past Surgical History:  Procedure Laterality Date  . GUM SURGERY    .  HYSTEROSCOPY    . KNEE SURGERY Right     Family History  Problem Relation Age of Onset  . Breast cancer Mother 36  . Bone cancer Father 80  . Breast cancer Sister 22  . Heart attack Sister   . Diabetes Sister   . Breast cancer Maternal Aunt        dx >50  . Cancer Maternal Aunt        NOS  . Breast cancer Other        MGF's sister #1  . Hyperlipidemia Other   . Hypertension Other   . Thyroid disease Other   . Stomach cancer Neg Hx   . Rectal cancer Neg Hx   . Pancreatic cancer Neg Hx   . Colon cancer Neg Hx     Allergies  Allergen Reactions  . Doxycycline     REACTION: rash  . Ether   . Indomethacin     REACTION: upset stomach  . Penicillins     REACTION: Hives    Current Outpatient Medications on File Prior to Visit  Medication Sig Dispense Refill  . aspirin 81 MG tablet Take 81 mg by mouth daily.      . citalopram (CELEXA) 40 MG tablet Take 1 tablet (40 mg total) by mouth 1 day or 1 dose. 100 tablet 3  . fluticasone (FLONASE) 50 MCG/ACT nasal spray INHALE 2 SPRAYS INTO BOTH NOSTRILS ONCE DAILY 16 g 6  . LORazepam (ATIVAN) 0.5 MG tablet take 1 tablet by mouth twice a day if needed 40 tablet 5  . OVER THE COUNTER MEDICATION Place 2 drops into both eyes daily as needed (dry eyes).    . RESTASIS 0.05 % ophthalmic emulsion INT 1 DROP AEY BID  3  . simvastatin (ZOCOR) 40 MG tablet Take 1 tablet (40 mg total) by mouth at bedtime. 100 tablet 3   No current facility-administered medications on file prior to visit.     BP (!) 146/90   Temp (!) 97.5 F (36.4 C) (Temporal)   Ht 5\' 4"  (1.626 m)   Wt 171 lb (77.6 kg)   BMI 29.35 kg/m       Objective:   Physical Exam Vitals signs and nursing note reviewed.  Constitutional:      General: She is not in acute distress.    Appearance: Normal appearance. She is not diaphoretic.  HENT:     Head: Normocephalic and atraumatic.     Right Ear: Tympanic membrane, ear canal and external ear normal. There is no impacted  cerumen.     Left Ear: Tympanic membrane, ear canal and external ear normal. There is no impacted cerumen.  Nose: Nose normal. No congestion or rhinorrhea.     Mouth/Throat:     Mouth: Mucous membranes are moist.     Pharynx: Oropharynx is clear. No oropharyngeal exudate or posterior oropharyngeal erythema.  Eyes:     General: No scleral icterus.       Right eye: No discharge.        Left eye: No discharge.     Conjunctiva/sclera: Conjunctivae normal.     Pupils: Pupils are equal, round, and reactive to light.  Neck:     Musculoskeletal: Normal range of motion and neck supple.     Thyroid: No thyromegaly.     Vascular: No JVD.     Trachea: No tracheal deviation.  Cardiovascular:     Rate and Rhythm: Normal rate and regular rhythm.     Pulses: Normal pulses.     Heart sounds: Normal heart sounds. No murmur. No friction rub. No gallop.   Pulmonary:     Effort: Pulmonary effort is normal. No respiratory distress.     Breath sounds: Normal breath sounds. No stridor. No wheezing or rales.  Chest:     Chest wall: No tenderness.  Abdominal:     General: Bowel sounds are normal. There is no distension.     Palpations: Abdomen is soft. There is no mass.     Tenderness: There is no abdominal tenderness. There is no right CVA tenderness, left CVA tenderness, guarding or rebound.     Hernia: No hernia is present.  Musculoskeletal: Normal range of motion.        General: No swelling, tenderness, deformity or signs of injury.     Right lower leg: No edema.     Left lower leg: No edema.  Lymphadenopathy:     Cervical: No cervical adenopathy.  Skin:    General: Skin is warm and dry.     Coloration: Skin is not jaundiced or pale.     Findings: No bruising, erythema, lesion or rash.  Neurological:     General: No focal deficit present.     Mental Status: She is alert and oriented to person, place, and time. Mental status is at baseline.     Cranial Nerves: No cranial nerve deficit.      Sensory: No sensory deficit.     Motor: No weakness or abnormal muscle tone.     Coordination: Coordination normal.     Gait: Gait normal.     Deep Tendon Reflexes: Reflexes normal.  Psychiatric:        Mood and Affect: Mood normal.        Behavior: Behavior normal.        Thought Content: Thought content normal.        Judgment: Judgment normal.       Assessment & Plan:  1. Routine general medical examination at a health care facility - encouraged heart healthy diet and exercise - Follow up in one year or sooner if needed - CBC with Differential/Platelet - Comprehensive metabolic panel - Lipid panel - TSH  2. Depression  - citalopram (CELEXA) 40 MG tablet; Take 1 tablet (40 mg total) by mouth daily.  Dispense: 90 tablet; Refill: 1  3. Estrogen deficiency  - DG Bone Density; Future  4. Hyperlipidemia, unspecified hyperlipidemia type  - simvastatin (ZOCOR) 40 MG tablet; Take 1 tablet (40 mg total) by mouth at bedtime.  Dispense: 90 tablet; Refill: 3  Shirline Frees, NP

## 2019-09-24 NOTE — Addendum Note (Signed)
Addended by: Miles Costain T on: 09/24/2019 10:58 AM   Modules accepted: Orders

## 2019-09-29 ENCOUNTER — Other Ambulatory Visit: Payer: Self-pay

## 2019-09-29 ENCOUNTER — Ambulatory Visit (INDEPENDENT_AMBULATORY_CARE_PROVIDER_SITE_OTHER)
Admission: RE | Admit: 2019-09-29 | Discharge: 2019-09-29 | Disposition: A | Discharge status: Home / Self Care | Payer: Medicare Other | Source: Ambulatory Visit | Attending: Adult Health | Admitting: Adult Health

## 2019-09-29 DIAGNOSIS — E2839 Other primary ovarian failure: Secondary | ICD-10-CM | POA: Diagnosis not present

## 2019-10-22 ENCOUNTER — Other Ambulatory Visit: Payer: Self-pay

## 2019-10-22 DIAGNOSIS — I739 Peripheral vascular disease, unspecified: Secondary | ICD-10-CM

## 2019-10-22 DIAGNOSIS — M25561 Pain in right knee: Secondary | ICD-10-CM

## 2019-10-22 DIAGNOSIS — Z8249 Family history of ischemic heart disease and other diseases of the circulatory system: Secondary | ICD-10-CM

## 2019-10-29 ENCOUNTER — Telehealth (HOSPITAL_COMMUNITY): Payer: Self-pay

## 2019-10-29 NOTE — Telephone Encounter (Signed)

## 2019-10-30 ENCOUNTER — Ambulatory Visit (INDEPENDENT_AMBULATORY_CARE_PROVIDER_SITE_OTHER)
Admission: RE | Admit: 2019-10-30 | Discharge: 2019-10-30 | Disposition: A | Discharge status: Home / Self Care | Payer: Medicare Other | Source: Ambulatory Visit | Attending: Family | Admitting: Family

## 2019-10-30 ENCOUNTER — Telehealth: Payer: Self-pay | Admitting: *Deleted

## 2019-10-30 ENCOUNTER — Ambulatory Visit (HOSPITAL_COMMUNITY)
Admission: RE | Admit: 2019-10-30 | Discharge: 2019-10-30 | Disposition: A | Discharge status: Home / Self Care | Payer: Medicare Other | Source: Ambulatory Visit | Attending: Family | Admitting: Family

## 2019-10-30 ENCOUNTER — Other Ambulatory Visit: Payer: Self-pay

## 2019-10-30 DIAGNOSIS — M25561 Pain in right knee: Secondary | ICD-10-CM | POA: Insufficient documentation

## 2019-10-30 DIAGNOSIS — Z8249 Family history of ischemic heart disease and other diseases of the circulatory system: Secondary | ICD-10-CM

## 2019-10-30 NOTE — Telephone Encounter (Signed)
Virtual Visit Pre-Appointment Phone Call  Today, I spoke with Tamara Hughes and performed the following actions:  1. I explained that we are currently trying to limit exposure to the COVID-19 virus by seeing patients at home rather than in the office.  I explained that the visits are best done by video, but can be done by telephone.  I asked the patient if a virtual visit that the patient would like to try instead of coming into the office. Tamara Hughes agreed to proceed with the virtual visit scheduled with Servando Snare MD on 11/07/19.    2. I confirmed the BEST phone number to call the day of the visit and- I included this in appointment notes.  3. I asked if the patient had access to (through a family member/friend) a smartphone with video capability to be used for her visit?"  The patient said yes -  .  4. I confirmed consent by  a. sending through [place] or by email the [place] as written at the end of this message or  b. verbally as listed below. i. This visit is being performed in the setting of COVID-19. ii. All virtual visits are billed to your insurance company just like a normal visit would be.   iii. We'd like you to understand that the technology does not allow for your provider to perform an examination, and thus may limit your provider's ability to fully assess your condition.  iv. If your provider identifies any concerns that need to be evaluated in person, we will make arrangements to do so.   v. Finally, though the technology is pretty good, we cannot assure that it will always work on either your or our end, and in the setting of a video visit, we may have to convert it to a phone-only visit.  In either situation, we cannot ensure that we have a secure connection.   vi. Are you willing to proceed?"  STAFF: Did the patient verbally acknowledge consent to telehealth visit? Document YES/NO here: YES  2. I advised the patient to be  prepared - I asked that the patient, on the day of her visit, record any information possible with the equipment at her home, such as blood pressure, pulse, oxygen saturation, and your weight and write them all down. I asked the patient to have a pen and paper handy nearby the day of the visit as well.  3. If the patient was scheduled for a video visit, I informed the patient that the visit with the doctor would start with a text to the smartphone # given to [name] by the patient.         If the patient was scheduled for a telephone call, I informed the patient that the visit with the doctor would start with a call to the telephone # given to [name] by the patient.  4. I Informed patient they will receive a phone call 15 minutes prior to their appointment time from a [place] or nurse to review medications, allergies, etc. to prepare for the visit.    TELEPHONE CALL NOTE  Tamara Hughes has been deemed a candidate for a follow-up tele-health visit to limit community exposure during the Covid-19 pandemic. I spoke with the patient via phone to ensure availability of phone/video source, confirm preferred email & phone number, and discuss instructions and expectations.  I reminded Tamara Hughes to be prepared with any vital sign and/or  heart rhythm information that could potentially be obtained via home monitoring, at the time of her visit. I reminded Tamara Hughes to expect a phone call prior to her visit.  Tamara Hughes, NT 10/30/2019 10:17 AM     FULL LENGTH CONSENT FOR TELE-HEALTH VISIT   I hereby voluntarily request, consent and authorize [facility] and its employed or contracted physicians, physician assistants, nurse practitioners or other licensed health care professionals (the Practitioner), to provide me with telemedicine health care services (the "Services") as deemed necessary by the treating Practitioner. I acknowledge and consent to receive the Services by the Practitioner via telemedicine. I  understand that the telemedicine visit will involve communicating with the Practitioner through live audiovisual communication technology and the disclosure of certain medical information by electronic transmission. I acknowledge that I have been given the opportunity to request an in-person assessment or other available alternative prior to the telemedicine visit and am voluntarily participating in the telemedicine visit.  I understand that I have the right to withhold or withdraw my consent to the use of telemedicine in the course of my care at any time, without affecting my right to future care or treatment, and that the Practitioner or I may terminate the telemedicine visit at any time. I understand that I have the right to inspect all information obtained and/or recorded in the course of the telemedicine visit and may receive copies of available information for a reasonable fee.  I understand that some of the potential risks of receiving the Services via telemedicine include:  [name] Delay or interruption in medical evaluation due to technological equipment failure or disruption; . Information transmitted may not be sufficient (e.g. poor resolution of images) to allow for appropriate medical decision making by the Practitioner; and/or  . In rare instances, security protocols could fail, causing a breach of personal health information.  Furthermore, I acknowledge that it is my responsibility to provide information about my medical history, conditions and care that is complete and accurate to the best of my ability. I acknowledge that Practitioner's advice, recommendations, and/or decision may be based on factors not within their control, such as incomplete or inaccurate data provided by me or distortions of diagnostic images or specimens that may result from electronic transmissions. I understand that the practice of medicine is not an exact science and that Practitioner makes no warranties or guarantees  regarding treatment outcomes. I acknowledge that I will receive a copy of this consent concurrently upon execution via email to the email address I last provided but may also request a printed copy by calling the office of [facility].    I understand that my insurance will be billed for this visit.   I have read or had this consent read to me. . I understand the contents of this consent, which adequately explains the benefits and risks of the Services being provided via telemedicine.  . I have been provided ample opportunity to ask questions regarding this consent and the Services and have had my questions answered to my satisfaction. . I give my informed consent for the services to be provided through the use of telemedicine in my medical care  By participating in this telemedicine visit I agree to the above.

## 2019-10-31 ENCOUNTER — Ambulatory Visit: Payer: Medicare Other | Admitting: Vascular Surgery

## 2019-11-07 ENCOUNTER — Encounter: Payer: Self-pay | Admitting: Vascular Surgery

## 2019-11-07 ENCOUNTER — Other Ambulatory Visit: Payer: Self-pay

## 2019-11-07 ENCOUNTER — Ambulatory Visit (INDEPENDENT_AMBULATORY_CARE_PROVIDER_SITE_OTHER): Payer: Medicare Other | Admitting: Vascular Surgery

## 2019-11-07 DIAGNOSIS — M25561 Pain in right knee: Secondary | ICD-10-CM

## 2019-11-07 DIAGNOSIS — Z8249 Family history of ischemic heart disease and other diseases of the circulatory system: Secondary | ICD-10-CM | POA: Diagnosis not present

## 2019-11-07 NOTE — Progress Notes (Signed)
Virtual Visit via Telephone Note   I connected with Tamara Hughes on 11/07/2019 using the [employer] by telephone and verified that I was speaking with the correct person using two identifiers.    The limitations of evaluation and management by telemedicine and the availability of in person appointments have been previously discussed with the patient and are documented in the patients chart. The patient expressed understanding and consented to proceed.  PCP: Shirline Frees, NP   Chief Complaint: Previous right lower extremity pain, family history aortic aneurysm  History of Present Illness: Tamara Hughes is a 68 y.o. female with history of right lower extremity pain.  That time she was seen with ABI studies.  She also underwent injection of her right knee that did not help.  States that the pain lasted for a couple of weeks and subsequently resolved without any known alleviating factors.  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  Her father had an abdominal aortic aneurysm was followed in our practice.  She is now having a phone visit to follow-up on recent venous reflux studies as well as abdominal aortic aneurysm study.  Past Medical History:  Diagnosis Date  . Allergy   . Anxiety   . Arthritis   . Depression   . Family history of breast cancer   . Hyperlipidemia     Past Surgical History:  Procedure Laterality Date  . GUM SURGERY    . HYSTEROSCOPY    . KNEE SURGERY Right     Current Meds  Medication Sig  . aspirin 81 MG tablet Take 81 mg by mouth daily.    . citalopram (CELEXA) 40 MG tablet Take 1 tablet (40 mg total) by mouth daily.  . fluticasone (FLONASE) 50 MCG/ACT nasal spray INHALE 2 SPRAYS INTO BOTH NOSTRILS ONCE DAILY  . LORazepam (ATIVAN) 0.5 MG tablet take 1 tablet by mouth twice a day if needed  . OVER THE COUNTER MEDICATION Place 2 drops into both eyes daily as needed (dry eyes).  . RESTASIS 0.05 % ophthalmic emulsion INT 1 DROP AEY BID  .  simvastatin (ZOCOR) 40 MG tablet Take 1 tablet (40 mg total) by mouth at bedtime.    12 system ROS was negative unless otherwise noted in HPI   Observations/Objective: Patient is alert and oriented and demonstrates good understanding of our conversation today.   Abdominal aortic aneurysm duplex with large aortic aneurysm 2.1 cm  Venous reflux study demonstrates no reflux right lower extremity greater saphenous vein in greatest diameter distal to the junction is 0.44 cm and in the mid calf is 0.33 cm.  Assessment and Plan: 68 year old female initially seen for right lower extremity pain with normal arterial studies.  Venous studies are also normal.  Strong family history of abdominal aortic aneurysm and duplex also normal.  Given these normal studies and that her pain has resolved she can be seen on an as-needed basis.   I discussed the assessment and treatment plan with the patient. The patient was provided an opportunity to ask questions and all were answered. The patient agreed with the plan and demonstrated an understanding of the instructions.   The patient was advised to call back or seek an in-person evaluation if the symptoms worsen or if the condition fails to improve as anticipated.  I spent 11 minutes with the patient  and in preparation for the telephone encounter.   Signed, Lemar Livings [facility] Office:  [phone]  11/07/2019, 11:51 AM

## 2020-01-09 ENCOUNTER — Encounter: Payer: Self-pay | Admitting: Family Medicine

## 2020-01-09 ENCOUNTER — Ambulatory Visit: Payer: Medicare PPO | Admitting: Family Medicine

## 2020-01-09 ENCOUNTER — Other Ambulatory Visit: Payer: Self-pay

## 2020-01-09 VITALS — BP 128/86 | HR 72 | Temp 97.7°F | Ht 64.0 in | Wt 170.0 lb

## 2020-01-09 DIAGNOSIS — R319 Hematuria, unspecified: Secondary | ICD-10-CM

## 2020-01-09 LAB — POCT URINALYSIS DIPSTICK
Bilirubin, UA: NEGATIVE
Blood, UA: POSITIVE
Glucose, UA: NEGATIVE
Ketones, UA: NEGATIVE
Nitrite, UA: NEGATIVE
Protein, UA: POSITIVE — AB
Spec Grav, UA: 1.015 (ref 1.010–1.025)
Urobilinogen, UA: 0.2 E.U./dL
pH, UA: 6 (ref 5.0–8.0)

## 2020-01-09 MED ORDER — CEPHALEXIN 500 MG PO CAPS: 500.0000 mg | ORAL_CAPSULE | Freq: Three times a day (TID) | ORAL | 0 refills | Status: DC

## 2020-01-09 NOTE — Progress Notes (Signed)
  Subjective:     Patient ID: Tamara Hughes, female   DOB: [birth year], 69 y.o.   MRN: [id number]  HPI   Tamara Hughes is seen with some gross hematuria with urination yesterday.  She said some frequency and mild discomfort with urination.  No recent UTIs.  Denies any fevers or chills.  No flank pain.  Takes no blood thinners.  She does take baby aspirin.  She quit smoking several years ago.  Denies any appetite or weight changes.  She feels certain this was not from her vagina as much as with wiping following urination.  She has not had any vaginal spotting on her underwear or otherwise  Past Medical History:  Diagnosis Date  . Allergy   . Anxiety   . Arthritis   . Depression   . Family history of breast cancer   . Hyperlipidemia    Past Surgical History:  Procedure Laterality Date  . GUM SURGERY    . HYSTEROSCOPY    . KNEE SURGERY Right     reports that she quit smoking about 36 years ago. Her smoking use included cigarettes. She has a 15.00 pack-year smoking history. She has never used smokeless tobacco. She reports current alcohol use. She reports that she does not use drugs. family history includes Bone cancer (age of onset: 19) in her father; Breast cancer in her maternal aunt and another family member; Breast cancer (age of onset: 51) in her mother; Breast cancer (age of onset: 35) in her sister; Cancer in her maternal aunt; Diabetes in her sister; Heart attack in her sister; Hyperlipidemia in an other family member; Hypertension in an other family member; Thyroid disease in an other family member. Allergies  Allergen Reactions  . Doxycycline     REACTION: rash  . Ether   . Indomethacin     REACTION: upset stomach  . Penicillins     REACTION: Hives     Review of Systems  Constitutional: Negative for appetite change, chills, fever and unexpected weight change.  Respiratory: Negative for shortness of breath.   Cardiovascular: Negative for chest pain.  Gastrointestinal:  Negative for abdominal pain, nausea and vomiting.  Genitourinary: Positive for dysuria and hematuria. Negative for flank pain and vaginal bleeding.  Musculoskeletal: Negative for back pain.  Neurological: Negative for dizziness.       Objective:   Physical Exam Vitals and nursing note reviewed.  Constitutional:      Appearance: Normal appearance.  Cardiovascular:     Rate and Rhythm: Normal rate and regular rhythm.  Pulmonary:     Effort: Pulmonary effort is normal.     Breath sounds: Normal breath sounds.  Neurological:     Mental Status: She is alert.        Assessment:     Dysuria with hematuria.  Rule out acute cystitis    Plan:     -Urine culture sent -Stay well-hydrated -Start Keflex 500 mg 3 times daily for 5 days pending culture results -If culture negative recommend further urologic evaluation  Kristian Covey MD [facility]

## 2020-01-09 NOTE — Patient Instructions (Signed)
Hematuria, Adult Hematuria is blood in the urine. Blood may be visible in the urine, or it may be identified with a test. This condition can be caused by infections of the bladder, urethra, kidney, or prostate. Other possible causes include:  Kidney stones.  Cancer of the urinary tract.  Too much calcium in the urine.  Conditions that are passed from parent to child (inherited conditions).  Exercise that requires a lot of energy. Infections can usually be treated with medicine, and a kidney stone usually will pass through your urine. If neither of these is the cause of your hematuria, more tests may be needed to identify the cause of your symptoms. It is very important to tell your health care provider about any blood in your urine, even if it is painless or the blood stops without treatment. Blood in the urine, when it happens and then stops and then happens again, can be a symptom of a very serious condition, including cancer. There is no pain in the initial stages of many urinary cancers. Follow these instructions at home: Medicines  Take over-the-counter and prescription medicines only as told by your health care provider.  If you were prescribed an antibiotic medicine, take it as told by your health care provider. Do not stop taking the antibiotic even if you start to feel better. Eating and drinking  Drink enough fluid to keep your urine clear or pale yellow. It is recommended that you drink 3-4 quarts (2.8-3.8 L) a day. If you have been diagnosed with an infection, it is recommended that you drink cranberry juice in addition to large amounts of water.  Avoid caffeine, tea, and carbonated beverages. These tend to irritate the bladder.  Avoid alcohol because it may irritate the prostate (men). General instructions  If you have been diagnosed with a kidney stone, follow your health care provider's instructions about straining your urine to catch the stone.  Empty your bladder  often. Avoid holding urine for long periods of time.  If you are female: ? After a bowel movement, wipe from front to back and use each piece of toilet paper only once. ? Empty your bladder before and after sex.  Pay attention to any changes in your symptoms. Tell your health care provider about any changes or any new symptoms.  It is your responsibility to get your test results. Ask your health care provider, or the department performing the test, when your results will be ready.  Keep all follow-up visits as told by your health care provider. This is important. Contact a health care provider if:  You develop back pain.  You have a fever.  You have nausea or vomiting.  Your symptoms do not improve after 3 days.  Your symptoms get worse. Get help right away if:  You develop severe vomiting and are unable take medicine without vomiting.  You develop severe pain in your back or abdomen even though you are taking medicine.  You pass a large amount of blood in your urine.  You pass blood clots in your urine.  You feel very weak or like you might faint.  You faint. Summary  Hematuria is blood in the urine. It has many possible causes.  It is very important that you tell your health care provider about any blood in your urine, even if it is painless or the blood stops without treatment.  Take over-the-counter and prescription medicines only as told by your health care provider.  Drink enough fluid to keep   your urine clear or pale yellow. This information is not intended to replace advice given to you by your health care provider. Make sure you discuss any questions you have with your health care provider. Document Revised: 05/07/2019 Document Reviewed: 01/13/2017 Elsevier Patient Education  2020 Elsevier Inc.  

## 2020-01-11 LAB — URINE CULTURE
MICRO NUMBER:: 10047081
SPECIMEN QUALITY:: ADEQUATE

## 2020-01-12 ENCOUNTER — Other Ambulatory Visit: Payer: Self-pay

## 2020-01-12 MED ORDER — CIPROFLOXACIN HCL 500 MG PO TABS: 500.0000 mg | ORAL_TABLET | Freq: Two times a day (BID) | ORAL | 0 refills | Status: AC

## 2020-02-05 ENCOUNTER — Encounter: Payer: Self-pay | Admitting: Adult Health

## 2020-02-06 ENCOUNTER — Other Ambulatory Visit: Payer: Self-pay

## 2020-02-06 ENCOUNTER — Telehealth (INDEPENDENT_AMBULATORY_CARE_PROVIDER_SITE_OTHER): Payer: Medicare PPO | Admitting: Adult Health

## 2020-02-06 ENCOUNTER — Encounter: Payer: Self-pay | Admitting: Adult Health

## 2020-02-06 DIAGNOSIS — Z20822 Contact with and (suspected) exposure to covid-19: Secondary | ICD-10-CM

## 2020-02-06 NOTE — Progress Notes (Signed)
Virtual Visit via Telephone Note  I connected with Tamara Hughes on 02/06/20 at  2:30 PM EST by telephone and verified that I am speaking with the correct person using two identifiers.   I discussed the limitations, risks, security and privacy concerns of performing an evaluation and management service by telephone and the availability of in person appointments. I also discussed with the patient that there may be a patient responsible charge related to this service. The patient expressed understanding and agreed to proceed.  Location patient: home Location provider: work or home office Participants present for the call: patient, provider Patient did not have a visit in the prior 7 days to address this/these issue(s).   History of Present Illness: 69 year old female who is being evaluated today for concern of COVID-19.  Per patient report her granddaughter who is 1 years old and lives with Tamara Hughes was around her other grandmother who tested positive for Covid 19 yesterday.  This granddaughter has been around the grandmother who tested positive multiple days prior to testing positive.  Granddaughter is showing no signs or symptoms but is being tested today.  This patient would like to know if she needs to be tested, she is asymptomatic    Observations/Objective: Patient sounds cheerful and well on the phone. I do not appreciate any SOB. Speech and thought processing are grossly intact. Patient reported vitals:  Assessment and Plan: 1. Close exposure to COVID-19 virus -Advised patient that it was likely too soon to find out if she test positive for COVID-19.  She was advised to self quarantine for the next 4 to 5 days and then she can get tested if she likes.  Was advised to get tested sooner if symptoms develop.   Follow Up Instructions:   I did not refer this patient for an OV in the next 24 hours for this/these issue(s).  I discussed the assessment and treatment plan with the patient. The  patient was provided an opportunity to ask questions and all were answered. The patient agreed with the plan and demonstrated an understanding of the instructions.   The patient was advised to call back or seek an in-person evaluation if the symptoms worsen or if the condition fails to improve as anticipated.  I provided 12 minutes of non-face-to-face time during this encounter.   Shirline Frees, NP

## 2020-02-06 NOTE — Telephone Encounter (Signed)
Pt has been scheduled for a VV.  

## 2020-02-10 ENCOUNTER — Ambulatory Visit: Payer: Medicare PPO | Attending: Internal Medicine

## 2020-02-10 DIAGNOSIS — Z20822 Contact with and (suspected) exposure to covid-19: Secondary | ICD-10-CM

## 2020-02-12 ENCOUNTER — Ambulatory Visit: Payer: Medicare PPO

## 2020-02-12 LAB — NOVEL CORONAVIRUS, NAA: SARS-CoV-2, NAA: NOT DETECTED

## 2020-02-16 ENCOUNTER — Ambulatory Visit: Payer: Medicare PPO | Attending: Family

## 2020-02-16 DIAGNOSIS — Z23 Encounter for immunization: Secondary | ICD-10-CM | POA: Insufficient documentation

## 2020-02-16 NOTE — Progress Notes (Signed)
   Covid-19 Vaccination Clinic  Name:  Tamara Hughes    MRN: [id number] DOB: 01-28-1951  02/16/2020  [name] was observed post Covid-19 immunization for 15 minutes without incidence. She was provided with Vaccine Information Sheet and instruction to access the V-Safe system.   [name] was instructed to call 911 with any severe reactions post vaccine: [name] Difficulty breathing  . Swelling of your face and throat  . A fast heartbeat  . A bad rash all over your body  . Dizziness and weakness    Immunizations Administered    Name Date Dose VIS Date Route   Moderna COVID-19 Vaccine 02/16/2020  2:21 PM 0.5 mL 11/25/2019 Intramuscular   Manufacturer: Moderna   Lot: [id number]   NDC: 84859-276-39

## 2020-03-23 ENCOUNTER — Ambulatory Visit: Payer: Medicare PPO | Attending: Family

## 2020-03-23 DIAGNOSIS — Z23 Encounter for immunization: Secondary | ICD-10-CM

## 2020-03-23 NOTE — Progress Notes (Signed)
   Covid-19 Vaccination Clinic  Name:  Tamara Hughes    MRN: [id number] DOB: [birth year]  03/23/2020  Tamara Hughes was observed post Covid-19 immunization for 15 minutes without incident. She was provided with Vaccine Information Sheet and instruction to access the V-Safe system.   Tamara Hughes was instructed to call 911 with any severe reactions post vaccine: [name] Difficulty breathing  . Swelling of face and throat  . A fast heartbeat  . A bad rash all over body  . Dizziness and weakness   Immunizations Administered    Name Date Dose VIS Date Route   Moderna COVID-19 Vaccine 03/23/2020  2:06 PM 0.5 mL 11/25/2019 Intramuscular   Manufacturer: Moderna   Lot: [id number]   NDC: 40335-331-74

## 2020-03-30 ENCOUNTER — Telehealth: Payer: Self-pay | Admitting: *Deleted

## 2020-03-30 NOTE — Telephone Encounter (Signed)
Pt states she would like Dr. Eloy End to order the Urea cream.

## 2020-03-30 NOTE — Telephone Encounter (Signed)
I spoke with pt and she states she picked up the Magic Foot Cream today.

## 2020-04-20 ENCOUNTER — Other Ambulatory Visit: Payer: Self-pay | Admitting: Adult Health

## 2020-04-20 DIAGNOSIS — F329 Major depressive disorder, single episode, unspecified: Secondary | ICD-10-CM

## 2020-04-21 NOTE — Telephone Encounter (Signed)
Sent to the pharmacy by e-scribe. 

## 2020-06-08 DIAGNOSIS — H52203 Unspecified astigmatism, bilateral: Secondary | ICD-10-CM | POA: Diagnosis not present

## 2020-06-08 DIAGNOSIS — H16223 Keratoconjunctivitis sicca, not specified as Sjogren's, bilateral: Secondary | ICD-10-CM | POA: Diagnosis not present

## 2020-06-14 ENCOUNTER — Other Ambulatory Visit: Payer: Self-pay

## 2020-06-15 ENCOUNTER — Encounter: Payer: Self-pay | Admitting: Family Medicine

## 2020-06-15 ENCOUNTER — Ambulatory Visit (INDEPENDENT_AMBULATORY_CARE_PROVIDER_SITE_OTHER): Payer: Medicare PPO | Admitting: Family Medicine

## 2020-06-15 VITALS — BP 118/66 | HR 72 | Temp 97.6°F | Wt 161.6 lb

## 2020-06-15 DIAGNOSIS — W540XXA Bitten by dog, initial encounter: Secondary | ICD-10-CM

## 2020-06-15 DIAGNOSIS — S61452A Open bite of left hand, initial encounter: Secondary | ICD-10-CM

## 2020-06-15 DIAGNOSIS — S60222A Contusion of left hand, initial encounter: Secondary | ICD-10-CM | POA: Diagnosis not present

## 2020-06-15 NOTE — Patient Instructions (Signed)
Hematoma A hematoma is a collection of blood under the skin, in an organ, in a body space, in a joint space, or in other tissue. The blood can thicken (clot) to form a lump that you can see and feel. The lump is often firm and may become sore and tender. Most hematomas get better in a few days to weeks. However, some hematomas may be serious and require medical care. Hematomas can range from very small to very large. What are the causes? This condition is caused by:  A blunt or penetrating injury.  A leakage from a blood vessel under the skin.  Some medical procedures, including surgeries, such as oral surgery, face lifts, and surgeries on the joints.  Some medical conditions that cause bleeding or bruising. There may be multiple hematomas that appear in different areas of the body. What increases the risk? You are more likely to develop this condition if:  You are an older adult.  You use blood thinners. What are the signs or symptoms?  Symptoms of this condition depend on where the hematoma is located.  Common symptoms of a hematoma that is under the skin include:  A firm lump on the body.  Pain and tenderness in the area.  Bruising. Blue, dark blue, purple-red, or yellowish skin (discoloration) may appear at the site of the hematoma if the hematoma is close to the surface of the skin. Common symptoms of a hematoma that is deep in the tissues or body spaces may be less obvious. They include:  A collection of blood in the stomach (intra-abdominal hematoma). This may cause pain in the abdomen, weakness, fainting, and shortness of breath.  A collection of blood in the head (intracranial hematoma). This may cause a headache or symptoms such as weakness, trouble speaking or understanding, or a change in consciousness. How is this diagnosed? This condition is diagnosed based on:  Your medical history.  A physical exam.  Imaging tests, such as an ultrasound or CT scan. These may  be needed if your health care provider suspects a hematoma in deeper tissues or body spaces.  Blood tests. These may be needed if your health care provider believes that the hematoma is caused by a medical condition. How is this treated? Treatment for this condition depends on the cause, size, and location of the hematoma. Treatment may include:  Doing nothing. The majority of hematomas do not need treatment as many of them go away on their own over time.  Surgery or close monitoring. This may be needed for large hematomas or hematomas that affect vital organs.  Medicines. Medicines may be given if there is an underlying medical cause for the hematoma. Follow these instructions at home: Managing pain, stiffness, and swelling   If directed, put ice on the affected area. ? Put ice in a plastic bag. ? Place a towel between your skin and the bag. ? Leave the ice on for 20 minutes, 2-3 times a day for the first couple of days.  If directed, apply heat to the affected area after applying ice for a couple of days. Use the heat source that your health care provider recommends, such as a moist heat pack or a heating pad. ? Place a towel between your skin and the heat source. ? Leave the heat on for 20-30 minutes. ? Remove the heat if your skin turns bright red. This is especially important if you are unable to feel pain, heat, or cold. You may have a greater   risk of getting burned.  Raise (elevate) the affected area above the level of your heart while you are sitting or lying down.  If told, wrap the affected area with an elastic bandage. The bandage applies pressure (compression) to the area, which may help to reduce swelling and promote healing. Do not wrap the bandage too tightly around the affected area.  If your hematoma is on a leg or foot (lower extremity) and is painful, your health care provider may recommend crutches. Use them as told by your health care provider. General  instructions  Take over-the-counter and prescription medicines only as told by your health care provider.  Keep all follow-up visits as told by your health care provider. This is important. Contact a health care provider if:  You have a fever.  The swelling or discoloration gets worse.  You develop more hematomas. Get help right away if:  Your pain is worse or your pain is not controlled with medicine.  Your skin over the hematoma breaks or starts bleeding.  Your hematoma is in your chest or abdomen and you have weakness, shortness of breath, or a change in consciousness.  You have a hematoma on your scalp that is caused by a fall or injury, and you also have: ? A headache that gets worse. ? Trouble speaking or understanding speech. ? Weakness. ? Change in alertness or consciousness. Summary  A hematoma is a collection of blood under the skin, in an organ, in a body space, in a joint space, or in other tissue.  This condition usually does not need treatment because many hematomas go away on their own over time.  Large hematomas, or those that may affect vital organs, may need surgical drainage or monitoring. If the hematoma is caused by a medical condition, medicines may be prescribed.  Get help right away if your hematoma breaks or starts to bleed, you have shortness of breath, or you have a headache or trouble speaking after a fall. This information is not intended to replace advice given to you by your health care provider. Make sure you discuss any questions you have with your health care provider. Document Revised: 05/07/2019 Document Reviewed: 05/16/2018 Elsevier Patient Education  2020 [employer] Inc.  

## 2020-06-15 NOTE — Progress Notes (Signed)
Established Patient Office Visit  Subjective:  Patient ID: Tamara Hughes, female    DOB: 1951/10/18  Age: 69 y.o. MRN: [id number]  CC:  Chief Complaint  Patient presents with  . Animal Bite    got bite sunday on left hand and is swollen but swelling has gone down     HPI Tamara Hughes presents for dog bite to left hand.  This was her dog which is a beagle.  He has been fully vaccinated.  He states he had a piece of paper in his mouth and she went to take this away and he better at that point.  There was no break in skin.  She had some immediate swelling dorsum left hand and increased pain and swelling is already going down.  There is no bleeding.  No obvious break in the skin.  Her tetanus is up-to-date.  Past Medical History:  Diagnosis Date  . Allergy   . Anxiety   . Arthritis   . Depression   . Family history of breast cancer   . Hyperlipidemia     Past Surgical History:  Procedure Laterality Date  . GUM SURGERY    . HYSTEROSCOPY    . KNEE SURGERY Right     Family History  Problem Relation Age of Onset  . Breast cancer Mother 57  . Bone cancer Father 74  . Breast cancer Sister 34  . Heart attack Sister   . Diabetes Sister   . Breast cancer Maternal Aunt        dx >50  . Cancer Maternal Aunt        NOS  . Breast cancer Other        MGF's sister #1  . Hyperlipidemia Other   . Hypertension Other   . Thyroid disease Other   . Stomach cancer Neg Hx   . Rectal cancer Neg Hx   . Pancreatic cancer Neg Hx   . Colon cancer Neg Hx     Social History   Socioeconomic History  . Marital status: Single    Spouse name: Not on file  . Number of children: 1  . Years of education: Not on file  . Highest education level: Not on file  Occupational History  . Not on file  Tobacco Use  . Smoking status: Former Smoker    Packs/day: 1.00    Years: 15.00    Pack years: 15.00    Types: Cigarettes    Quit date: 04/21/1983    Years since quitting: 37.1  . Smokeless  tobacco: Never Used  Vaping Use  . Vaping Use: Never used  Substance and Sexual Activity  . Alcohol use: Yes    Comment: socially  . Drug use: No  . Sexual activity: Not on file  Other Topics Concern  . Not on file  Social History Narrative  . Not on file   Social Determinants of Health   Financial Resource Strain:   . Difficulty of Paying Living Expenses:   Food Insecurity:   . Worried About Programme researcher, broadcasting/film/video in the Last Year:   . Barista in the Last Year:   Transportation Needs:   . Freight forwarder (Medical):   [name] Lack of Transportation (Non-Medical):   Physical Activity:   . Days of Exercise per Week:   . Minutes of Exercise per Session:   Stress:   . Feeling of Stress :   Social Connections:   . Frequency  of Communication with Friends and Family:   . Frequency of Social Gatherings with Friends and Family:   . Attends Religious Services:   . Active Member of Clubs or Organizations:   . Attends Banker Meetings:   [name] Marital Status:   Intimate Partner Violence:   . Fear of Current or Ex-Partner:   . Emotionally Abused:   [name] Physically Abused:   . Sexually Abused:     Outpatient Medications Prior to Visit  Medication Sig Dispense Refill  . aspirin 81 MG tablet Take 81 mg by mouth daily.      . citalopram (CELEXA) 40 MG tablet TAKE 1 TABLET(40 MG) BY MOUTH DAILY 90 tablet 1  . fluticasone (FLONASE) 50 MCG/ACT nasal spray INHALE 2 SPRAYS INTO BOTH NOSTRILS ONCE DAILY 16 g 6  . LORazepam (ATIVAN) 0.5 MG tablet take 1 tablet by mouth twice a day if needed 40 tablet 5  . OVER THE COUNTER MEDICATION Place 2 drops into both eyes daily as needed (dry eyes).    . RESTASIS 0.05 % ophthalmic emulsion INT 1 DROP AEY BID  3  . simvastatin (ZOCOR) 40 MG tablet Take 1 tablet (40 mg total) by mouth at bedtime. 90 tablet 3  . cephALEXin (KEFLEX) 500 MG capsule Take 1 capsule (500 mg total) by mouth 3 (three) times daily. (Patient not taking: Reported on  06/15/2020) 15 capsule 0   No facility-administered medications prior to visit.    Allergies  Allergen Reactions  . Doxycycline     REACTION: rash  . Ether   . Indomethacin     REACTION: upset stomach  . Penicillins     REACTION: Hives    ROS Review of Systems  Constitutional: Negative for chills and fever.      Objective:    Physical Exam Vitals reviewed.  Constitutional:      Appearance: Normal appearance.  Cardiovascular:     Rate and Rhythm: Normal rate and regular rhythm.  Musculoskeletal:     Comments: Left hand reveals some mild swelling just proximal to the third MCP joint.  There is no visible break in skin.  Minimally tender.  No discoloration of skin. Full range of motion all digits of the hand  Neurological:     Mental Status: She is alert.     BP 118/66 (BP Location: Left Arm, Patient Position: Sitting, Cuff Size: Normal)   Pulse 72   Temp 97.6 F (36.4 C) (Temporal)   Wt 161 lb 9.6 oz (73.3 kg)   SpO2 96%   BMI 27.74 kg/m  Wt Readings from Last 3 Encounters:  06/15/20 161 lb 9.6 oz (73.3 kg)  01/09/20 170 lb (77.1 kg)  09/24/19 171 lb (77.6 kg)     Health Maintenance Due  Topic Date Due  . Hepatitis C Screening  Never done    There are no preventive care reminders to display for this patient.  Lab Results  Component Value Date   TSH 2.06 09/24/2019   Lab Results  Component Value Date   WBC 6.2 09/24/2019   HGB 12.7 09/24/2019   HCT 40.4 09/24/2019   MCV 82.0 09/24/2019   PLT 293.0 09/24/2019   Lab Results  Component Value Date   NA 144 09/24/2019   K 4.3 09/24/2019   CO2 31 09/24/2019   GLUCOSE 75 09/24/2019   BUN 10 09/24/2019   CREATININE 0.65 09/24/2019   BILITOT 0.6 09/24/2019   ALKPHOS 66 09/24/2019   AST 15 09/24/2019  ALT 13 09/24/2019   PROT 6.5 09/24/2019   ALBUMIN 4.0 09/24/2019   CALCIUM 9.2 09/24/2019   GFR 109.03 09/24/2019   Lab Results  Component Value Date   CHOL 158 09/24/2019   Lab Results   Component Value Date   HDL 53.20 09/24/2019   Lab Results  Component Value Date   LDLCALC 83 09/24/2019   Lab Results  Component Value Date   TRIG 111.0 09/24/2019   Lab Results  Component Value Date   CHOLHDL 3 09/24/2019   No results found for: HGBA1C    Assessment & Plan:   Problem List Items Addressed This Visit    None    Visit Diagnoses    Dog bite of left hand, initial encounter    -  Primary   Traumatic hematoma of left hand, initial encounter        She appears to have small hematoma which is already resolving.  No signs of secondary infection.  No evidence for major tendon injury Follow for now and follow-up immediately for any increased redness, warmth, or swelling  No orders of the defined types were placed in this encounter.   Follow-up: No follow-ups on file.    Carolann Littler, MD

## 2020-06-17 DIAGNOSIS — M25761 Osteophyte, right knee: Secondary | ICD-10-CM | POA: Diagnosis not present

## 2020-06-17 DIAGNOSIS — M1711 Unilateral primary osteoarthritis, right knee: Secondary | ICD-10-CM | POA: Diagnosis not present

## 2020-06-17 DIAGNOSIS — M170 Bilateral primary osteoarthritis of knee: Secondary | ICD-10-CM | POA: Diagnosis not present

## 2020-06-17 DIAGNOSIS — M25561 Pain in right knee: Secondary | ICD-10-CM | POA: Diagnosis not present

## 2020-06-17 DIAGNOSIS — M222X1 Patellofemoral disorders, right knee: Secondary | ICD-10-CM | POA: Diagnosis not present

## 2020-06-21 DIAGNOSIS — M1712 Unilateral primary osteoarthritis, left knee: Secondary | ICD-10-CM | POA: Diagnosis not present

## 2020-06-24 DIAGNOSIS — M1711 Unilateral primary osteoarthritis, right knee: Secondary | ICD-10-CM | POA: Diagnosis not present

## 2020-07-07 DIAGNOSIS — M1712 Unilateral primary osteoarthritis, left knee: Secondary | ICD-10-CM | POA: Diagnosis not present

## 2020-07-08 DIAGNOSIS — M1711 Unilateral primary osteoarthritis, right knee: Secondary | ICD-10-CM | POA: Diagnosis not present

## 2020-07-14 DIAGNOSIS — M1712 Unilateral primary osteoarthritis, left knee: Secondary | ICD-10-CM | POA: Diagnosis not present

## 2020-07-15 DIAGNOSIS — M1711 Unilateral primary osteoarthritis, right knee: Secondary | ICD-10-CM | POA: Diagnosis not present

## 2020-07-21 DIAGNOSIS — M1712 Unilateral primary osteoarthritis, left knee: Secondary | ICD-10-CM | POA: Diagnosis not present

## 2020-07-22 DIAGNOSIS — M1711 Unilateral primary osteoarthritis, right knee: Secondary | ICD-10-CM | POA: Diagnosis not present

## 2020-07-28 DIAGNOSIS — M1712 Unilateral primary osteoarthritis, left knee: Secondary | ICD-10-CM | POA: Diagnosis not present

## 2020-07-29 DIAGNOSIS — M1711 Unilateral primary osteoarthritis, right knee: Secondary | ICD-10-CM | POA: Diagnosis not present

## 2020-08-02 ENCOUNTER — Other Ambulatory Visit: Payer: Self-pay | Admitting: Adult Health

## 2020-08-02 DIAGNOSIS — Z1231 Encounter for screening mammogram for malignant neoplasm of breast: Secondary | ICD-10-CM

## 2020-09-01 DIAGNOSIS — Z20828 Contact with and (suspected) exposure to other viral communicable diseases: Secondary | ICD-10-CM | POA: Diagnosis not present

## 2020-09-04 ENCOUNTER — Ambulatory Visit (HOSPITAL_COMMUNITY)
Admission: EM | Admit: 2020-09-04 | Discharge: 2020-09-04 | Disposition: A | Discharge status: Home / Self Care | Payer: Medicare PPO

## 2020-09-04 ENCOUNTER — Encounter (HOSPITAL_COMMUNITY): Payer: Self-pay

## 2020-09-04 ENCOUNTER — Other Ambulatory Visit: Payer: Self-pay

## 2020-09-04 DIAGNOSIS — H938X3 Other specified disorders of ear, bilateral: Secondary | ICD-10-CM

## 2020-09-04 DIAGNOSIS — R0981 Nasal congestion: Secondary | ICD-10-CM

## 2020-09-04 DIAGNOSIS — U071 COVID-19: Secondary | ICD-10-CM | POA: Diagnosis not present

## 2020-09-04 DIAGNOSIS — J3089 Other allergic rhinitis: Secondary | ICD-10-CM

## 2020-09-04 MED ORDER — PSEUDOEPHEDRINE HCL 30 MG PO TABS: 30.0000 mg | ORAL_TABLET | Freq: Three times a day (TID) | ORAL | 0 refills | Status: DC | PRN

## 2020-09-04 NOTE — ED Provider Notes (Addendum)
[facility]   MRN: [id number] DOB: 05/11/1951  Subjective:   Tamara Hughes is a 69 y.o. female presenting for evaluation.  Patient states that she tested positive for COVID-19 on 09/02/2020.  States that her PCP sent her here to get a chest x-ray.  Denies shortness of breath, chest pain, cough.  Patient has longstanding history of allergic rhinitis, has been using Zyrtec consistently.  No current facility-administered medications for this encounter.  Current Outpatient Medications:  [name]  Multiple Vitamin (MULTIVITAMIN) tablet, Take 1 tablet by mouth daily., Disp: , Rfl:  .  vitamin C (ASCORBIC ACID) 250 MG tablet, Take 500 mg by mouth daily., Disp: , Rfl:  .  aspirin 81 MG tablet, Take 81 mg by mouth daily.  , Disp: , Rfl:  .  cephALEXin (KEFLEX) 500 MG capsule, Take 1 capsule (500 mg total) by mouth 3 (three) times daily. (Patient not taking: Reported on 06/15/2020), Disp: 15 capsule, Rfl: 0 .  citalopram (CELEXA) 40 MG tablet, TAKE 1 TABLET(40 MG) BY MOUTH DAILY, Disp: 90 tablet, Rfl: 1 .  fluticasone (FLONASE) 50 MCG/ACT nasal spray, INHALE 2 SPRAYS INTO BOTH NOSTRILS ONCE DAILY, Disp: 16 g, Rfl: 6 .  LORazepam (ATIVAN) 0.5 MG tablet, take 1 tablet by mouth twice a day if needed, Disp: 40 tablet, Rfl: 5 .  OVER THE COUNTER MEDICATION, Place 2 drops into both eyes daily as needed (dry eyes)., Disp: , Rfl:  .  RESTASIS 0.05 % ophthalmic emulsion, INT 1 DROP AEY BID, Disp: , Rfl: 3 .  simvastatin (ZOCOR) 40 MG tablet, Take 1 tablet (40 mg total) by mouth at bedtime., Disp: 90 tablet, Rfl: 3   Allergies  Allergen Reactions  . Doxycycline     REACTION: rash  . Ether   . Indomethacin     REACTION: upset stomach  . Penicillins     REACTION: Hives    Past Medical History:  Diagnosis Date  . Allergy   . Anxiety   . Arthritis   . Depression   . Family history of breast cancer   . Hyperlipidemia      Past Surgical History:  Procedure Laterality Date  . GUM  SURGERY    . HYSTEROSCOPY    . KNEE SURGERY Right     Family History  Problem Relation Age of Onset  . Breast cancer Mother 47  . Bone cancer Father 61  . Breast cancer Sister 28  . Heart attack Sister   . Diabetes Sister   . Breast cancer Maternal Aunt        dx >50  . Cancer Maternal Aunt        NOS  . Breast cancer Other        MGF's sister #1  . Hyperlipidemia Other   . Hypertension Other   . Thyroid disease Other   . Stomach cancer Neg Hx   . Rectal cancer Neg Hx   . Pancreatic cancer Neg Hx   . Colon cancer Neg Hx     Social History   Tobacco Use  . Smoking status: Former Smoker    Packs/day: 1.00    Years: 15.00    Pack years: 15.00    Types: Cigarettes    Quit date: 04/21/1983    Years since quitting: 37.4  . Smokeless tobacco: Never Used  Vaping Use  . Vaping Use: Never used  Substance Use Topics  . Alcohol use: Yes    Comment: socially  .  Drug use: No    ROS   Objective:   Vitals: BP (!) 143/84   Pulse 74   Temp 98.7 F (37.1 C) (Oral)   Resp 16   Ht 5\' 3"  (1.6 m)   Wt 165 lb (74.8 kg)   SpO2 100%   BMI 29.23 kg/m   Physical Exam Constitutional:      General: She is not in acute distress.    Appearance: Normal appearance. She is well-developed. She is not ill-appearing, toxic-appearing or diaphoretic.  HENT:     Head: Normocephalic and atraumatic.     Right Ear: External ear normal.     Left Ear: External ear normal.     Nose: Nose normal.     Mouth/Throat:     Mouth: Mucous membranes are moist.     Pharynx: Oropharynx is clear. No oropharyngeal exudate or posterior oropharyngeal erythema.  Eyes:     General: No scleral icterus.       Right eye: No discharge.        Left eye: No discharge.     Extraocular Movements: Extraocular movements intact.     Conjunctiva/sclera: Conjunctivae normal.     Pupils: Pupils are equal, round, and reactive to light.  Cardiovascular:     Rate and Rhythm: Normal rate and regular rhythm.      Pulses: Normal pulses.     Heart sounds: Normal heart sounds. No murmur heard.  No friction rub. No gallop.   Pulmonary:     Effort: Pulmonary effort is normal. No respiratory distress.     Breath sounds: Normal breath sounds. No stridor. No wheezing, rhonchi or rales.  Skin:    General: Skin is warm and dry.     Findings: No rash.  Neurological:     General: No focal deficit present.     Mental Status: She is alert and oriented to person, place, and time.  Psychiatric:        Mood and Affect: Mood normal.        Behavior: Behavior normal.        Thought Content: Thought content normal.        Judgment: Judgment normal.      Assessment and Plan :   PDMP not reviewed this encounter.  1. COVID-19   2. Sinus congestion   3. Ear fullness, bilateral   4. Allergic rhinitis due to other allergic trigger, unspecified seasonality     Patient has excellent physical exam findings.  Recommended continued supportive care.  Maintain regular medications.  Patient was sent here by her PCP for chest x-ray but I counseled patient against this given that she has clear lung sounds, normal vital signs and pulse oximetry of 100%.  Patient was agreeable to this.  Follow-up with PCP as soon as possible. Counseled patient on potential for adverse effects with medications prescribed/recommended today, ER and return-to-clinic precautions discussed, patient verbalized understanding.    , PA-C 09/04/20 1700

## 2020-09-04 NOTE — Discharge Instructions (Addendum)

## 2020-09-04 NOTE — ED Triage Notes (Signed)
Pt COVID +. Pt PCP sent her here to get chest x-ray, because she tested COVID +. Pt denies SOB or difficulty breathing.

## 2020-09-06 ENCOUNTER — Ambulatory Visit
Admission: RE | Admit: 2020-09-06 | Discharge: 2020-09-06 | Disposition: A | Discharge status: Home / Self Care | Payer: Medicare PPO | Source: Ambulatory Visit | Attending: Adult Health | Admitting: Adult Health

## 2020-09-06 ENCOUNTER — Other Ambulatory Visit: Payer: Self-pay

## 2020-09-06 DIAGNOSIS — Z1231 Encounter for screening mammogram for malignant neoplasm of breast: Secondary | ICD-10-CM | POA: Diagnosis not present

## 2020-09-22 ENCOUNTER — Other Ambulatory Visit: Payer: Self-pay

## 2020-09-22 ENCOUNTER — Ambulatory Visit (INDEPENDENT_AMBULATORY_CARE_PROVIDER_SITE_OTHER): Payer: Medicare PPO

## 2020-09-22 DIAGNOSIS — Z0000 Encounter for general adult medical examination without abnormal findings: Secondary | ICD-10-CM

## 2020-09-22 NOTE — Progress Notes (Signed)
Subjective:   Tamara Hughes is a 69 y.o. female who presents for an Initial Medicare Annual Wellness Visit.  I connected with Tamara Hughes  today by telephone and verified that I am speaking with the correct person using two identifiers. Location patient: home Location provider: work Persons participating in the virtual visit: patient, provider.   I discussed the limitations, risks, security and privacy concerns of performing an evaluation and management service by telephone and the availability of in person appointments. I also discussed with the patient that there may be a patient responsible charge related to this service. The patient expressed understanding and verbally consented to this telephonic visit.    Interactive audio and video telecommunications were attempted between this provider and patient, however failed, due to patient having technical difficulties OR patient did not have access to video capability.  We continued and completed visit with audio only.      Review of Systems    N/A Cardiac Risk Factors include: advanced age (>[id number], 57>65 women)     Objective:    Today's Vitals   There is no height or weight on file to calculate BMI.  Advanced Directives 09/22/2020 09/04/2020 11/07/2019 07/25/2019  Does Patient Have a Medical Advance Directive? No No No No  Would patient like information on creating a medical advance directive? No - Patient declined No - Patient declined No - Patient declined No - Patient declined    Current Medications (verified) Outpatient Encounter Medications as of 09/22/2020  Medication Sig  . aspirin 81 MG tablet Take 81 mg by mouth daily.    . citalopram (CELEXA) 40 MG tablet TAKE 1 TABLET(40 MG) BY MOUTH DAILY  . fluticasone (FLONASE) 50 MCG/ACT nasal spray INHALE 2 SPRAYS INTO BOTH NOSTRILS ONCE DAILY  . LORazepam (ATIVAN) 0.5 MG tablet take 1 tablet by mouth twice a day if needed  . Multiple Vitamin (MULTIVITAMIN) tablet Take 1 tablet by  mouth daily.  [name]. OVER THE COUNTER MEDICATION Place 2 drops into both eyes daily as needed (dry eyes).  . pseudoephedrine (SUDAFED) 30 MG tablet Take 1 tablet (30 mg total) by mouth every 8 (eight) hours as needed for congestion.  . RESTASIS 0.05 % ophthalmic emulsion INT 1 DROP AEY BID  . simvastatin (ZOCOR) 40 MG tablet Take 1 tablet (40 mg total) by mouth at bedtime.  . vitamin C (ASCORBIC ACID) 250 MG tablet Take 500 mg by mouth daily.  . [DISCONTINUED] cephALEXin (KEFLEX) 500 MG capsule Take 1 capsule (500 mg total) by mouth 3 (three) times daily. (Patient not taking: Reported on 06/15/2020)   No facility-administered encounter medications on file as of 09/22/2020.    Allergies (verified) Doxycycline, Ether, Indomethacin, and Penicillins   History: Past Medical History:  Diagnosis Date  . Allergy   . Anxiety   . Arthritis   . Depression   . Family history of breast cancer   . Hyperlipidemia    Past Surgical History:  Procedure Laterality Date  . GUM SURGERY    . HYSTEROSCOPY    . KNEE SURGERY Right    Family History  Problem Relation Age of Onset  . Breast cancer Mother 3960  . Bone cancer Father 6292  . Breast cancer Sister 4361  . Heart attack Sister   . Diabetes Sister   . Breast cancer Maternal Aunt        dx >50  . Cancer Maternal Aunt        NOS  . Breast cancer Other  MGF's sister #1  . Hyperlipidemia Other   . Hypertension Other   . Thyroid disease Other   . Stomach cancer Neg Hx   . Rectal cancer Neg Hx   . Pancreatic cancer Neg Hx   . Colon cancer Neg Hx    Social History   Socioeconomic History  . Marital status: Single    Spouse name: Not on file  . Number of children: 1  . Years of education: Not on file  . Highest education level: Not on file  Occupational History  . Not on file  Tobacco Use  . Smoking status: Former Smoker    Packs/day: 1.00    Years: 15.00    Pack years: 15.00    Types: Cigarettes    Quit date: 04/21/1983    Years  since quitting: 37.4  . Smokeless tobacco: Never Used  Vaping Use  . Vaping Use: Never used  Substance and Sexual Activity  . Alcohol use: Yes    Comment: socially  . Drug use: No  . Sexual activity: Not on file  Other Topics Concern  . Not on file  Social History Narrative  . Not on file   Social Determinants of Health   Financial Resource Strain: Low Risk   . Difficulty of Paying Living Expenses: Not hard at all  Food Insecurity: No Food Insecurity  . Worried About Programme researcher, broadcasting/film/video in the Last Year: Never true  . Ran Out of Food in the Last Year: Never true  Transportation Needs: No Transportation Needs  . Lack of Transportation (Medical): No  . Lack of Transportation (Non-Medical): No  Physical Activity: Inactive  . Days of Exercise per Week: 0 days  . Minutes of Exercise per Session: 0 min  Stress: No Stress Concern Present  . Feeling of Stress : Only a little  Social Connections: Moderately Isolated  . Frequency of Communication with Friends and Family: Once a week  . Frequency of Social Gatherings with Friends and Family: Once a week  . Attends Religious Services: More than 4 times per year  . Active Member of Clubs or Organizations: Yes  . Attends Banker Meetings: More than 4 times per year  . Marital Status: Never married    Tobacco Counseling Counseling given: Not Answered   Clinical Intake:  Pre-visit preparation completed: Yes  Pain : No/denies pain     Nutritional Risks: None Diabetes: No  How often do you need to have someone help you when you read instructions, pamphlets, or other written materials from your doctor or pharmacy?: 1 - Never What is the last grade level you completed in school?: Bachelors Degree  Diabetic?No  Interpreter Needed?: No  Information entered by :: SCrews,LPN   Activities of Daily Living In your present state of health, do you have any difficulty performing the following activities: 09/22/2020    Hearing? Y  Comment Has bilateral hearing aids  Vision? N  Difficulty concentrating or making decisions? N  Walking or climbing stairs? N  Dressing or bathing? N  Doing errands, shopping? N  Preparing Food and eating ? N  Using the Toilet? N  In the past six months, have you accidently leaked urine? N  Do you have problems with loss of bowel control? N  Managing your Medications? N  Managing your Finances? N  Housekeeping or managing your Housekeeping? N  Some recent data might be hidden    Patient Care Team: Shirline Frees, NP as PCP - General (  Family Medicine)  Indicate any recent Medical Services you may have received from other than [facility] providers in the past year (date may be approximate).     Assessment:   This is a routine wellness examination for Tamara Hughes.  Hearing/Vision screen  Hearing Screening   125Hz  250Hz  500Hz  1000Hz  2000Hz  3000Hz  4000Hz  6000Hz  8000Hz   Right ear:           Left ear:           Vision Screening Comments: Patient states that she gets her eyes examined yearly has issues with dry eyes   Dietary issues and exercise activities discussed: Current Exercise Habits: The patient does not participate in regular exercise at present  Goals    . Exercise 150 min/wk Moderate Activity     Plans to start on October 1st       Depression Screen PHQ 2/9 Scores 09/22/2020 09/24/2019 09/24/2017 09/13/2016 09/28/2015 09/24/2014  PHQ - 2 Score 0 0 0 0 0 0  PHQ- 9 Score 0 - - - - -    Fall Risk Fall Risk  09/22/2020 09/24/2019 09/24/2017 09/13/2016 09/28/2015  Falls in the past year? 1 0 No No No  Number falls in past yr: 0 - - - -  Injury with Fall? 0 - - - -  Risk for fall due to : Orthopedic patient - - - -  Follow up Falls evaluation completed;Falls prevention discussed - - - -    Any stairs in or around the home? Yes  If so, are there any without handrails? No  Home free of loose throw rugs in walkways, pet beds, electrical cords, etc? Yes  Adequate lighting  in your home to reduce risk of falls? Yes   ASSISTIVE DEVICES UTILIZED TO PREVENT FALLS:  Life alert? No  Use of a cane, walker or w/c? No  Grab bars in the bathroom? Yes  Shower chair or bench in shower? Yes  Elevated toilet seat or a handicapped toilet? Yes    Cognitive Function:  Cognition within normal limits, screening not indicated      Immunizations Immunization History  Administered Date(s) Administered  . Fluad Quad(high Dose 65+) 09/24/2019  . Influenza Split 09/19/2012  . Influenza, High Dose Seasonal PF 09/28/2015, 09/13/2016, 09/24/2017  . Influenza,inj,Quad PF,6+ Mos 09/24/2014, 09/16/2018  . Moderna SARS-COVID-2 Vaccination 02/16/2020, 03/23/2020  . Pneumococcal Conjugate-13 09/24/2017  . Pneumococcal Polysaccharide-23 09/24/2019  . Td 12/25/2004, 09/28/2015    TDAP status: Up to date Flu Vaccine status: Declined, Education has been provided regarding the importance of this vaccine but patient still declined. Advised may receive this vaccine at local pharmacy or Health Dept. Aware to provide a copy of the vaccination record if obtained from local pharmacy or Health Dept. Verbalized acceptance and understanding. Pneumococcal vaccine status: Up to date Covid-19 vaccine status: Completed vaccines  Qualifies for Shingles Vaccine? Yes   Zostavax completed No   Shingrix Completed?: No.    Education has been provided regarding the importance of this vaccine. Patient has been advised to call insurance company to determine out of pocket expense if they have not yet received this vaccine. Advised may also receive vaccine at local pharmacy or Health Dept. Verbalized acceptance and understanding.  Screening Tests Health Maintenance  Topic Date Due  . Hepatitis C Screening  Never done  . INFLUENZA VACCINE  07/25/2020  . MAMMOGRAM  09/06/2022  . TETANUS/TDAP  09/27/2025  . COLONOSCOPY  12/28/2027  . DEXA SCAN  Completed  . COVID-19 Vaccine  Completed  . PNA vac Low  Risk Adult  Completed    Health Maintenance  Health Maintenance Due  Topic Date Due  . Hepatitis C Screening  Never done  . INFLUENZA VACCINE  07/25/2020    Colorectal cancer screening: Completed 12/27/2017. Repeat every 10 years Mammogram status: Completed 09/06/2020. Repeat every year Bone Density status: Completed 09/29/2019. Results reflect: Bone density results: NORMAL. Repeat every 5 years.  Lung Cancer Screening: (Low Dose CT Chest recommended if Age 43-80 years, 30 pack-year currently smoking OR have quit w/in 15years.) does not qualify.   Lung Cancer Screening Referral: N/A  Additional Screening:  Hepatitis C Screening: does qualify;   Vision Screening: Recommended annual ophthalmology exams for early detection of glaucoma and other disorders of the eye. Is the patient up to date with their annual eye exam?  Yes  Who is the provider or what is the name of the office in which the patient attends annual eye exams? Dr. Memory Argue  If pt is not established with a provider, would they like to be referred to a provider to establish care? No .   Dental Screening: Recommended annual dental exams for proper oral hygiene  Community Resource Referral / Chronic Care Management: CRR required this visit?  No   CCM required this visit?  No      Plan:     I have personally reviewed and noted the following in the patient's chart:   . Medical and social history . Use of alcohol, tobacco or illicit drugs  . Current medications and supplements . Functional ability and status . Nutritional status . Physical activity . Advanced directives . List of other physicians . Hospitalizations, surgeries, and ER visits in previous 12 months . Vitals . Screenings to include cognitive, depression, and falls . Referrals and appointments  In addition, I have reviewed and discussed with patient certain preventive protocols, quality metrics, and best practice recommendations. A written  personalized care plan for preventive services as well as general preventive health recommendations were provided to patient.     Theodora Blow, LPN   5/[id number]/[id number]   Nurse Notes: None

## 2020-09-22 NOTE — Patient Instructions (Signed)
Tamara Hughes , Thank you for taking time to come for your Medicare Wellness Visit. I appreciate your ongoing commitment to your health goals. Please review the following plan we discussed and let me know if I can assist you in the future.   Screening recommendations/referrals: Colonoscopy: Up to date, next due 12/28/2027 Mammogram: Up to date, next due 09/06/2020 Bone Density: No longer required  Recommended yearly ophthalmology/optometry visit for glaucoma screening and checkup Recommended yearly dental visit for hygiene and checkup  Vaccinations: Influenza vaccine: Currently due, you may receive at our office or at your local pharmacy. Pneumococcal vaccine: Completed series Tdap vaccine: Up to date, next due 09/27/2025 Shingles vaccine: Completed series    Advanced directives: Advance directive discussed with you today. Even though you declined this today please call our office should you change your mind and we can give you the proper paperwork for you to fill out.   Conditions/risks identified: please try to incorporate at least 150 minutes of moderate exercise into your weekly routine   Next appointment: 10/20/2020 @ 9:30 am with Shirline Frees, NP for a physical    Preventive Care 65 Years and Older, Female Preventive care refers to lifestyle choices and visits with your health care provider that can promote health and wellness. What does preventive care include?  A yearly physical exam. This is also called an annual well check.  Dental exams once or twice a year.  Routine eye exams. Ask your health care provider how often you should have your eyes checked.  Personal lifestyle choices, including:  Daily care of your teeth and gums.  Regular physical activity.  Eating a healthy diet.  Avoiding tobacco and drug use.  Limiting alcohol use.  Practicing safe sex.  Taking low-dose aspirin every day.  Taking vitamin and mineral supplements as recommended by your health  care provider. What happens during an annual well check? The services and screenings done by your health care provider during your annual well check will depend on your age, overall health, lifestyle risk factors, and family history of disease. Counseling  Your health care provider may ask you questions about your:  Alcohol use.  Tobacco use.  Drug use.  Emotional well-being.  Home and relationship well-being.  Sexual activity.  Eating habits.  History of falls.  Memory and ability to understand (cognition).  Work and work Astronomer.  Reproductive health. Screening  You may have the following tests or measurements:  Height, weight, and BMI.  Blood pressure.  Lipid and cholesterol levels. These may be checked every 5 years, or more frequently if you are over 67 years old.  Skin check.  Lung cancer screening. You may have this screening every year starting at age 4 if you have a 30-pack-year history of smoking and currently smoke or have quit within the past 15 years.  Fecal occult blood test (FOBT) of the stool. You may have this test every year starting at age 38.  Flexible sigmoidoscopy or colonoscopy. You may have a sigmoidoscopy every 5 years or a colonoscopy every 10 years starting at age 86.  Hepatitis C blood test.  Hepatitis B blood test.  Sexually transmitted disease (STD) testing.  Diabetes screening. This is done by checking your blood sugar (glucose) after you have not eaten for a while (fasting). You may have this done every 1-3 years.  Bone density scan. This is done to screen for osteoporosis. You may have this done starting at age 27.  Mammogram. This may be done  every 1-2 years. Talk to your health care provider about how often you should have regular mammograms. Talk with your health care provider about your test results, treatment options, and if necessary, the need for more tests. Vaccines  Your health care provider may recommend certain  vaccines, such as:  Influenza vaccine. This is recommended every year.  Tetanus, diphtheria, and acellular pertussis (Tdap, Td) vaccine. You may need a Td booster every 10 years.  Zoster vaccine. You may need this after age 53.  Pneumococcal 13-valent conjugate (PCV13) vaccine. One dose is recommended after age 23.  Pneumococcal polysaccharide (PPSV23) vaccine. One dose is recommended after age 20. Talk to your health care provider about which screenings and vaccines you need and how often you need them. This information is not intended to replace advice given to you by your health care provider. Make sure you discuss any questions you have with your health care provider. Document Released: 01/07/2016 Document Revised: 08/30/2016 Document Reviewed: 10/12/2015 Elsevier Interactive Patient Education  2017 [place] Prevention in the Home Falls can cause injuries. They can happen to people of all ages. There are many things you can do to make your home safe and to help prevent falls. What can I do on the outside of my home?  Regularly fix the edges of walkways and driveways and fix any cracks.  Remove anything that might make you trip as you walk through a door, such as a raised step or threshold.  Trim any bushes or trees on the path to your home.  Use bright outdoor lighting.  Clear any walking paths of anything that might make someone trip, such as rocks or tools.  Regularly check to see if handrails are loose or broken. Make sure that both sides of any steps have handrails.  Any raised decks and porches should have guardrails on the edges.  Have any leaves, snow, or ice cleared regularly.  Use sand or salt on walking paths during winter.  Clean up any spills in your garage right away. This includes oil or grease spills. What can I do in the bathroom?  Use night lights.  Install grab bars by the toilet and in the tub and shower. Do not use towel bars as grab  bars.  Use non-skid mats or decals in the tub or shower.  If you need to sit down in the shower, use a plastic, non-slip stool.  Keep the floor dry. Clean up any water that spills on the floor as soon as it happens.  Remove soap buildup in the tub or shower regularly.  Attach bath mats securely with double-sided non-slip rug tape.  Do not have throw rugs and other things on the floor that can make you trip. What can I do in the bedroom?  Use night lights.  Make sure that you have a light by your bed that is easy to reach.  Do not use any sheets or blankets that are too big for your bed. They should not hang down onto the floor.  Have a firm chair that has side arms. You can use this for support while you get dressed.  Do not have throw rugs and other things on the floor that can make you trip. What can I do in the kitchen?  Clean up any spills right away.  Avoid walking on wet floors.  Keep items that you use a lot in easy-to-reach places.  If you need to reach something above you, use a  strong step stool that has a grab bar.  Keep electrical cords out of the way.  Do not use floor polish or wax that makes floors slippery. If you must use wax, use non-skid floor wax.  Do not have throw rugs and other things on the floor that can make you trip. What can I do with my stairs?  Do not leave any items on the stairs.  Make sure that there are handrails on both sides of the stairs and use them. Fix handrails that are broken or loose. Make sure that handrails are as long as the stairways.  Check any carpeting to make sure that it is firmly attached to the stairs. Fix any carpet that is loose or worn.  Avoid having throw rugs at the top or bottom of the stairs. If you do have throw rugs, attach them to the floor with carpet tape.  Make sure that you have a light switch at the top of the stairs and the bottom of the stairs. If you do not have them, ask someone to add them for  you. What else can I do to help prevent falls?  Wear shoes that:  Do not have high heels.  Have rubber bottoms.  Are comfortable and fit you well.  Are closed at the toe. Do not wear sandals.  If you use a stepladder:  Make sure that it is fully opened. Do not climb a closed stepladder.  Make sure that both sides of the stepladder are locked into place.  Ask someone to hold it for you, if possible.  Clearly mark and make sure that you can see:  Any grab bars or handrails.  First and last steps.  Where the edge of each step is.  Use tools that help you move around (mobility aids) if they are needed. These include:  Canes.  Walkers.  Scooters.  Crutches.  Turn on the lights when you go into a dark area. Replace any light bulbs as soon as they burn out.  Set up your furniture so you have a clear path. Avoid moving your furniture around.  If any of your floors are uneven, fix them.  If there are any pets around you, be aware of where they are.  Review your medicines with your doctor. Some medicines can make you feel dizzy. This can increase your chance of falling. Ask your doctor what other things that you can do to help prevent falls. This information is not intended to replace advice given to you by your health care provider. Make sure you discuss any questions you have with your health care provider. Document Released: 10/07/2009 Document Revised: 05/18/2016 Document Reviewed: 01/15/2015 Elsevier Interactive Patient Education  2017 [employer].

## 2020-10-20 ENCOUNTER — Encounter: Payer: Self-pay | Admitting: Adult Health

## 2020-10-20 ENCOUNTER — Ambulatory Visit (INDEPENDENT_AMBULATORY_CARE_PROVIDER_SITE_OTHER): Payer: Medicare PPO | Admitting: Adult Health

## 2020-10-20 ENCOUNTER — Other Ambulatory Visit: Payer: Self-pay

## 2020-10-20 VITALS — BP 142/80 | HR 73 | Temp 98.1°F | Resp 16 | Ht 63.5 in | Wt 162.2 lb

## 2020-10-20 DIAGNOSIS — F329 Major depressive disorder, single episode, unspecified: Secondary | ICD-10-CM

## 2020-10-20 DIAGNOSIS — Z0000 Encounter for general adult medical examination without abnormal findings: Secondary | ICD-10-CM

## 2020-10-20 DIAGNOSIS — Z23 Encounter for immunization: Secondary | ICD-10-CM

## 2020-10-20 DIAGNOSIS — E785 Hyperlipidemia, unspecified: Secondary | ICD-10-CM

## 2020-10-20 MED ORDER — LORAZEPAM 0.5 MG PO TABS: ORAL_TABLET | ORAL | 5 refills | Status: DC

## 2020-10-20 MED ORDER — LORAZEPAM 0.5 MG PO TABS: ORAL_TABLET | ORAL | 2 refills | Status: DC

## 2020-10-20 MED ORDER — CITALOPRAM HYDROBROMIDE 40 MG PO TABS: 40.0000 mg | ORAL_TABLET | Freq: Every day | ORAL | 1 refills | Status: DC

## 2020-10-20 MED ORDER — SIMVASTATIN 40 MG PO TABS: 40.0000 mg | ORAL_TABLET | Freq: Every day | ORAL | 3 refills | Status: DC

## 2020-10-20 NOTE — Addendum Note (Signed)
Addended by: Lerry Liner on: 10/20/2020 10:13 AM   Modules accepted: Orders

## 2020-10-20 NOTE — Progress Notes (Signed)
Subjective:    Patient ID: Tamara Hughes, female    DOB: 1951/03/09, 69 y.o.   MRN: [id number]  HPI Patient presents for yearly preventative medicine examination. She is a pleasant 69 year old female who  has a past medical history of Allergy, Anxiety, Arthritis, Depression, Family history of breast cancer, and Hyperlipidemia.  Anxiety/Depression -she is currently prescribed Celexa 40 mg nightly and Ativan 0.5 mg twice daily as needed.  She does feel well controlled on this regimen.  Hyperlipidemia-she takes Zocor 40 mg nightly and a baby aspirin.  She denies myalgia or fatigue  Lab Results  Component Value Date   CHOL 158 09/24/2019   HDL 53.20 09/24/2019   LDLCALC 83 09/24/2019   TRIG 111.0 09/24/2019   CHOLHDL 3 09/24/2019    History of elevated blood pressure readings-she does monitor her blood pressures at home and reports readings in the 120s over 80s.  Her blood pressure usually goes up when she comes to medical appointments  All immunizations and health maintenance protocols were reviewed with the patient and needed orders were placed.  Appropriate screening laboratory values were ordered for the patient including screening of hyperlipidemia, renal function and hepatic function.   Medication reconciliation,  past medical history, social history, problem list and allergies were reviewed in detail with the patient  Goals were established with regard to weight loss, exercise, and  diet in compliance with medications Wt Readings from Last 3 Encounters:  10/20/20 162 lb 3.2 oz (73.6 kg)  09/04/20 165 lb (74.8 kg)  06/15/20 161 lb 9.6 oz (73.3 kg)    She is up-to-date on routine dental, vision, mammograms, and colon cancer screening.   Review of Systems  Constitutional: Negative.   HENT: Negative.   Eyes: Negative.   Respiratory: Negative.   Cardiovascular: Negative.   Gastrointestinal: Negative.   Endocrine: Negative.   Genitourinary: Negative.   Musculoskeletal:  Negative.   Skin: Negative.   Allergic/Immunologic: Negative.   Neurological: Negative.   Hematological: Negative.   Psychiatric/Behavioral: Negative.      Past Medical History:  Diagnosis Date   Allergy    Anxiety    Arthritis    Depression    Family history of breast cancer    Hyperlipidemia     Social History   Socioeconomic History   Marital status: Single    Spouse name: Not on file   Number of children: 1   Years of education: Not on file   Highest education level: Not on file  Occupational History   Not on file  Tobacco Use   Smoking status: Former Smoker    Packs/day: 1.00    Years: 15.00    Pack years: 15.00    Types: Cigarettes    Quit date: 04/21/1983    Years since quitting: 37.5   Smokeless tobacco: Never Used  Vaping Use   Vaping Use: Never used  Substance and Sexual Activity   Alcohol use: Yes    Comment: socially   Drug use: No   Sexual activity: Not on file  Other Topics Concern   Not on file  Social History Narrative   Not on file   Social Determinants of Health   Financial Resource Strain: Low Risk    Difficulty of Paying Living Expenses: Not hard at all  Food Insecurity: No Food Insecurity   Worried About Charity fundraiser in the Last Year: Never true   [place] in the Last Year: Never true  Transportation Needs: No Data processing manager (Medical): No   Lack of Transportation (Non-Medical): No  Physical Activity: Inactive   Days of Exercise per Week: 0 days   Minutes of Exercise per Session: 0 min  Stress: No Stress Concern Present   Feeling of Stress : Only a little  Social Connections: Moderately Isolated   Frequency of Communication with Friends and Family: Once a week   Frequency of Social Gatherings with Friends and Family: Once a week   Attends Religious Services: More than 4 times per year   Active Member of [employer] or Organizations: Yes   Attends Programme researcher, broadcasting/film/video: More than 4 times per year   Marital Status: Never married  Human resources officer Violence: Not At Risk   Fear of Current or Ex-Partner: No   Emotionally Abused: No   Physically Abused: No   Sexually Abused: No    Past Surgical History:  Procedure Laterality Date   GUM SURGERY     HYSTEROSCOPY     KNEE SURGERY Right     Family History  Problem Relation Age of Onset   Breast cancer Mother 88   Bone cancer Father 66   Breast cancer Sister 29   Heart attack Sister    Diabetes Sister    Breast cancer Maternal Aunt        dx >50   Cancer Maternal Aunt        NOS   Breast cancer Other        MGF's sister #1   Hyperlipidemia Other    Hypertension Other    Thyroid disease Other    Stomach cancer Neg Hx    Rectal cancer Neg Hx    Pancreatic cancer Neg Hx    Colon cancer Neg Hx     Allergies  Allergen Reactions   Doxycycline     REACTION: rash   Ether    Indomethacin     REACTION: upset stomach   Penicillins     REACTION: Hives    Current Outpatient Medications on File Prior to Visit  Medication Sig Dispense Refill   aspirin 81 MG tablet Take 81 mg by mouth daily.       citalopram (CELEXA) 40 MG tablet TAKE 1 TABLET(40 MG) BY MOUTH DAILY 90 tablet 1   fluticasone (FLONASE) 50 MCG/ACT nasal spray INHALE 2 SPRAYS INTO BOTH NOSTRILS ONCE DAILY 16 g 6   LORazepam (ATIVAN) 0.5 MG tablet take 1 tablet by mouth twice a day if needed 40 tablet 5   Multiple Vitamin (MULTIVITAMIN) tablet Take 1 tablet by mouth daily.     OVER THE COUNTER MEDICATION Place 2 drops into both eyes daily as needed (dry eyes).     pseudoephedrine (SUDAFED) 30 MG tablet Take 1 tablet (30 mg total) by mouth every 8 (eight) hours as needed for congestion. 30 tablet 0   RESTASIS 0.05 % ophthalmic emulsion INT 1 DROP AEY BID  3   simvastatin (ZOCOR) 40 MG tablet Take 1 tablet (40 mg total) by mouth at bedtime. 90 tablet 3   vitamin C (ASCORBIC ACID)  250 MG tablet Take 500 mg by mouth daily.     No current facility-administered medications on file prior to visit.    BP (!) 142/80 (BP Location: Left Arm, Cuff Size: Large)    Pulse 73    Temp 98.1 F (36.7 C) (Oral)    Resp 16    Ht 5' 3.5" (1.613 m)  Wt 162 lb 3.2 oz (73.6 kg)    SpO2 97%    BMI 28.28 kg/m       Objective:   Physical Exam Vitals and nursing note reviewed.  Constitutional:      General: She is not in acute distress.    Appearance: Normal appearance. She is well-developed. She is not ill-appearing.  HENT:     Head: Normocephalic and atraumatic.     Right Ear: Tympanic membrane, ear canal and external ear normal. There is no impacted cerumen.     Left Ear: Tympanic membrane, ear canal and external ear normal. There is no impacted cerumen.     Nose: Nose normal. No congestion or rhinorrhea.     Mouth/Throat:     Mouth: Mucous membranes are moist.     Pharynx: Oropharynx is clear. No oropharyngeal exudate or posterior oropharyngeal erythema.  Eyes:     General:        Right eye: No discharge.        Left eye: No discharge.     Extraocular Movements: Extraocular movements intact.     Conjunctiva/sclera: Conjunctivae normal.     Pupils: Pupils are equal, round, and reactive to light.  Neck:     Thyroid: No thyromegaly.     Vascular: No carotid bruit.     Trachea: No tracheal deviation.  Cardiovascular:     Rate and Rhythm: Normal rate and regular rhythm.     Pulses: Normal pulses.     Heart sounds: Normal heart sounds. No murmur heard.  No friction rub. No gallop.   Pulmonary:     Effort: Pulmonary effort is normal. No respiratory distress.     Breath sounds: Normal breath sounds. No stridor. No wheezing, rhonchi or rales.  Chest:     Chest wall: No tenderness.  Abdominal:     General: Abdomen is flat. Bowel sounds are normal. There is no distension.     Palpations: Abdomen is soft. There is no mass.     Tenderness: There is no abdominal tenderness.  There is no right CVA tenderness, left CVA tenderness, guarding or rebound.     Hernia: No hernia is present.  Musculoskeletal:        General: No swelling, tenderness, deformity or signs of injury. Normal range of motion.     Cervical back: Normal range of motion and neck supple.     Right lower leg: No edema.     Left lower leg: No edema.  Lymphadenopathy:     Cervical: No cervical adenopathy.  Skin:    General: Skin is warm and dry.     Coloration: Skin is not jaundiced or pale.     Findings: No bruising, erythema, lesion or rash.  Neurological:     General: No focal deficit present.     Mental Status: She is alert and oriented to person, place, and time.     Cranial Nerves: No cranial nerve deficit.     Sensory: No sensory deficit.     Motor: No weakness.     Coordination: Coordination normal.     Gait: Gait normal.     Deep Tendon Reflexes: Reflexes normal.  Psychiatric:        Mood and Affect: Mood normal.        Behavior: Behavior normal.        Thought Content: Thought content normal.        Judgment: Judgment normal.        Assessment &  Plan:  1. Routine general medical examination at a health care facility - Continue heart healthy diet and exercise - Follow up in one year or sooner if needed - CBC with Differential/Platelet; Future - Hemoglobin A1c; Future - Lipid panel; Future - TSH; Future - CMP with eGFR([facility]); Future  2. Hyperlipidemia, unspecified hyperlipidemia type  - simvastatin (ZOCOR) 40 MG tablet; Take 1 tablet (40 mg total) by mouth at bedtime.  Dispense: 90 tablet; Refill: 3 - CBC with Differential/Platelet; Future - Hemoglobin A1c; Future - Lipid panel; Future - TSH; Future - CMP with eGFR([facility]); Future  3. Reactive depression  - citalopram (CELEXA) 40 MG tablet; Take 1 tablet (40 mg total) by mouth daily.  Dispense: 90 tablet; Refill: 1  Dorothyann Peng, NP

## 2020-10-20 NOTE — Patient Instructions (Signed)
It was great seeing you today   We will follow up with you regarding your blood work   I will see you back in a year or sooner if needed 

## 2020-10-20 NOTE — Addendum Note (Signed)
Addended by: Nancy Fetter on: 10/20/2020 10:09 AM   Modules accepted: Orders

## 2020-10-21 LAB — HEMOGLOBIN A1C
Hgb A1c MFr Bld: 5.6 % of total Hgb (ref <5.7)
Mean Plasma Glucose: 114 (calc)
eAG (mmol/L): 6.3 (calc)

## 2020-10-21 LAB — CBC WITH DIFFERENTIAL/PLATELET
Absolute Monocytes: 561 cells/uL (ref 200–950)
Basophils Absolute: 61 cells/uL (ref 0–200)
Basophils Relative: 1.1 %
Eosinophils Absolute: 149 cells/uL (ref 15–500)
Eosinophils Relative: 2.7 %
HCT: 41.1 % (ref 35.0–45.0)
Hemoglobin: 12.7 g/dL (ref 11.7–15.5)
Lymphs Abs: 1276 cells/uL (ref 850–3900)
MCH: 26 pg — ABNORMAL LOW (ref 27.0–33.0)
MCHC: 30.9 g/dL — ABNORMAL LOW (ref 32.0–36.0)
MCV: 84.2 fL (ref 80.0–100.0)
MPV: 10.2 fL (ref 7.5–12.5)
Monocytes Relative: 10.2 %
Neutro Abs: 3454 cells/uL (ref 1500–7800)
Neutrophils Relative %: 62.8 %
Platelets: 281 10*3/uL (ref 140–400)
RBC: 4.88 10*6/uL (ref 3.80–5.10)
RDW: 13.3 % (ref 11.0–15.0)
Total Lymphocyte: 23.2 %
WBC: 5.5 10*3/uL (ref 3.8–10.8)

## 2020-10-21 LAB — COMPLETE METABOLIC PANEL WITH GFR
AG Ratio: 1.8 (calc) (ref 1.0–2.5)
ALT: 13 U/L (ref 6–29)
AST: 15 U/L (ref 10–35)
Albumin: 4.2 g/dL (ref 3.6–5.1)
Alkaline phosphatase (APISO): 65 U/L (ref 37–153)
BUN: 10 mg/dL (ref 7–25)
CO2: 28 mmol/L (ref 20–32)
Calcium: 9.3 mg/dL (ref 8.6–10.4)
Chloride: 105 mmol/L (ref 98–110)
Creat: 0.64 mg/dL (ref 0.60–0.93)
GFR, Est African American: 104 mL/min/{1.73_m2} (ref >=60)
GFR, Est Non African American: 90 mL/min/{1.73_m2} (ref >=60)
Globulin: 2.3 g/dL (calc) (ref 1.9–3.7)
Glucose, Bld: 80 mg/dL (ref 65–99)
Potassium: 4.1 mmol/L (ref 3.5–5.3)
Sodium: 142 mmol/L (ref 135–146)
Total Bilirubin: 0.6 mg/dL (ref 0.2–1.2)
Total Protein: 6.5 g/dL (ref 6.1–8.1)

## 2020-10-21 LAB — LIPID PANEL
Cholesterol: 172 mg/dL (ref <200)
HDL: 54 mg/dL (ref >=50)
LDL Cholesterol (Calc): 96 mg/dL (calc)
Non-HDL Cholesterol (Calc): 118 mg/dL (calc) (ref <130)
Total CHOL/HDL Ratio: 3.2 (calc) (ref <5.0)
Triglycerides: 121 mg/dL (ref <150)

## 2020-10-21 LAB — TSH: TSH: 1.57 mIU/L (ref 0.40–4.50)

## 2020-10-28 ENCOUNTER — Other Ambulatory Visit: Payer: Self-pay | Admitting: Adult Health

## 2020-10-28 DIAGNOSIS — F329 Major depressive disorder, single episode, unspecified: Secondary | ICD-10-CM

## 2020-12-03 ENCOUNTER — Other Ambulatory Visit: Payer: Self-pay | Admitting: Adult Health

## 2020-12-03 DIAGNOSIS — E785 Hyperlipidemia, unspecified: Secondary | ICD-10-CM

## 2021-02-02 DIAGNOSIS — M1711 Unilateral primary osteoarthritis, right knee: Secondary | ICD-10-CM | POA: Diagnosis not present

## 2021-02-03 DIAGNOSIS — M1712 Unilateral primary osteoarthritis, left knee: Secondary | ICD-10-CM | POA: Diagnosis not present

## 2021-02-09 DIAGNOSIS — M1711 Unilateral primary osteoarthritis, right knee: Secondary | ICD-10-CM | POA: Diagnosis not present

## 2021-02-10 DIAGNOSIS — M1711 Unilateral primary osteoarthritis, right knee: Secondary | ICD-10-CM | POA: Diagnosis not present

## 2021-02-16 DIAGNOSIS — M1711 Unilateral primary osteoarthritis, right knee: Secondary | ICD-10-CM | POA: Diagnosis not present

## 2021-02-17 DIAGNOSIS — M1712 Unilateral primary osteoarthritis, left knee: Secondary | ICD-10-CM | POA: Diagnosis not present

## 2021-02-23 DIAGNOSIS — M1711 Unilateral primary osteoarthritis, right knee: Secondary | ICD-10-CM | POA: Diagnosis not present

## 2021-02-24 DIAGNOSIS — M1712 Unilateral primary osteoarthritis, left knee: Secondary | ICD-10-CM | POA: Diagnosis not present

## 2021-04-22 ENCOUNTER — Ambulatory Visit: Payer: Medicare PPO | Admitting: Podiatry

## 2021-04-22 ENCOUNTER — Other Ambulatory Visit: Payer: Self-pay

## 2021-04-22 DIAGNOSIS — M79674 Pain in right toe(s): Secondary | ICD-10-CM

## 2021-04-22 DIAGNOSIS — M79675 Pain in left toe(s): Secondary | ICD-10-CM

## 2021-04-22 DIAGNOSIS — L819 Disorder of pigmentation, unspecified: Secondary | ICD-10-CM | POA: Diagnosis not present

## 2021-04-22 DIAGNOSIS — B351 Tinea unguium: Secondary | ICD-10-CM

## 2021-04-22 DIAGNOSIS — L608 Other nail disorders: Secondary | ICD-10-CM | POA: Diagnosis not present

## 2021-04-22 DIAGNOSIS — L603 Nail dystrophy: Secondary | ICD-10-CM

## 2021-04-22 MED ORDER — CICLOPIROX 8 % EX SOLN: Freq: Every day | CUTANEOUS | 0 refills | Status: DC

## 2021-04-22 NOTE — Progress Notes (Signed)
  Subjective:  Patient ID: Tamara Hughes, female    DOB: 1951/08/19,  MRN: [id number]  Chief Complaint  Patient presents with  . Nail Problem    B/L nail fungus in multiple toenails-tried many things OTC w/ no relief.     70 y.o. female presents with the above complaint. History confirmed with patient.   Objective:  Physical Exam: warm, good capillary refill, no trophic changes or ulcerative lesions, normal DP and PT pulses and normal sensory exam. Nails lesser toes thickened, dystrophic, subungual debris.  Assessment:   1. Pain due to onychomycosis of toenails of both feet    Plan:  Patient was evaluated and treated and all questions answered.  Onychomycosis -Educated on etiology of nail fungus. -Nail sample taken for microbiology and histology. -Rx Penlac  Return in about 4 weeks (around 05/20/2021) for Nail Fungus.

## 2021-04-22 NOTE — Addendum Note (Signed)
Addended by: Jodelle Red D on: 04/22/2021 01:50 PM   Modules accepted: Orders

## 2021-06-07 ENCOUNTER — Other Ambulatory Visit: Payer: Self-pay | Admitting: Adult Health

## 2021-06-07 DIAGNOSIS — F329 Major depressive disorder, single episode, unspecified: Secondary | ICD-10-CM

## 2021-06-13 DIAGNOSIS — H16223 Keratoconjunctivitis sicca, not specified as Sjogren's, bilateral: Secondary | ICD-10-CM | POA: Diagnosis not present

## 2021-06-13 DIAGNOSIS — H10413 Chronic giant papillary conjunctivitis, bilateral: Secondary | ICD-10-CM | POA: Diagnosis not present

## 2021-06-13 DIAGNOSIS — H52203 Unspecified astigmatism, bilateral: Secondary | ICD-10-CM | POA: Diagnosis not present

## 2021-07-18 DIAGNOSIS — Z03818 Encounter for observation for suspected exposure to other biological agents ruled out: Secondary | ICD-10-CM | POA: Diagnosis not present

## 2021-07-26 ENCOUNTER — Other Ambulatory Visit: Payer: Self-pay | Admitting: Adult Health

## 2021-07-26 DIAGNOSIS — Z1231 Encounter for screening mammogram for malignant neoplasm of breast: Secondary | ICD-10-CM

## 2021-09-12 ENCOUNTER — Ambulatory Visit
Admission: RE | Admit: 2021-09-12 | Discharge: 2021-09-12 | Disposition: A | Discharge status: Home / Self Care | Payer: Medicare PPO | Source: Ambulatory Visit

## 2021-09-12 ENCOUNTER — Other Ambulatory Visit: Payer: Self-pay

## 2021-09-12 DIAGNOSIS — Z1231 Encounter for screening mammogram for malignant neoplasm of breast: Secondary | ICD-10-CM

## 2021-09-23 ENCOUNTER — Ambulatory Visit: Payer: Medicare PPO

## 2021-10-12 ENCOUNTER — Ambulatory Visit (INDEPENDENT_AMBULATORY_CARE_PROVIDER_SITE_OTHER): Payer: Medicare PPO

## 2021-10-12 DIAGNOSIS — Z0000 Encounter for general adult medical examination without abnormal findings: Secondary | ICD-10-CM | POA: Diagnosis not present

## 2021-10-12 NOTE — Progress Notes (Signed)
Subjective:   Tamara Hughes is a 70 y.o. female who presents for Medicare Annual (Subsequent) preventive examination.   I connected with Larina Bras today by telephone and verified that I am speaking with the correct person using two identifiers. Location patient: home Location provider: work Persons participating in the virtual visit: patient, provider.   I discussed the limitations, risks, security and privacy concerns of performing an evaluation and management service by telephone and the availability of in person appointments. I also discussed with the patient that there may be a patient responsible charge related to this service. The patient expressed understanding and verbally consented to this telephonic visit.    Interactive audio and video telecommunications were attempted between this provider and patient, however failed, due to patient having technical difficulties OR patient did not have access to video capability.  We continued and completed visit with audio only.    Review of Systems     Cardiac Risk Factors include: advanced age (>13men, >54 women);dyslipidemia;hypertension     Objective:    Today's Vitals   There is no height or weight on file to calculate BMI.  Advanced Directives 10/12/2021 09/22/2020 09/04/2020 11/07/2019 07/25/2019  Does Patient Have a Medical Advance Directive? No No No No No  Would patient like information on creating a medical advance directive? No - Patient declined No - Patient declined No - Patient declined No - Patient declined No - Patient declined    Current Medications (verified) Outpatient Encounter Medications as of 10/12/2021  Medication Sig   citalopram (CELEXA) 40 MG tablet TAKE 1 TABLET(40 MG) BY MOUTH DAILY   fluticasone (FLONASE) 50 MCG/ACT nasal spray INHALE 2 SPRAYS INTO BOTH NOSTRILS ONCE DAILY   LORazepam (ATIVAN) 0.5 MG tablet take 1 tablet by mouth twice a day if needed   OVER THE COUNTER MEDICATION Place 2 drops into  both eyes daily as needed (dry eyes).   pseudoephedrine (SUDAFED) 30 MG tablet Take 1 tablet (30 mg total) by mouth every 8 (eight) hours as needed for congestion.   RESTASIS 0.05 % ophthalmic emulsion INT 1 DROP AEY BID   simvastatin (ZOCOR) 40 MG tablet TAKE 1 TABLET(40 MG) BY MOUTH AT BEDTIME   aspirin 81 MG tablet Take 81 mg by mouth daily.   (Patient not taking: Reported on 10/12/2021)   ciclopirox (PENLAC) 8 % solution Apply topically at bedtime. Apply over nail and surrounding skin. Apply daily over previous coat. Remove weekly with file or polish remover. (Patient not taking: Reported on 10/12/2021)   Multiple Vitamin (MULTIVITAMIN) tablet Take 1 tablet by mouth daily. (Patient not taking: Reported on 10/12/2021)   vitamin C (ASCORBIC ACID) 250 MG tablet Take 500 mg by mouth daily. (Patient not taking: Reported on 10/12/2021)   No facility-administered encounter medications on file as of 10/12/2021.    Allergies (verified) Doxycycline, Ether, Indomethacin, and Penicillins   History: Past Medical History:  Diagnosis Date   Allergy    Anxiety    Arthritis    Depression    Family history of breast cancer    Hyperlipidemia    Past Surgical History:  Procedure Laterality Date   GUM SURGERY     HYSTEROSCOPY     KNEE SURGERY Right    Family History  Problem Relation Age of Onset   Breast cancer Mother 2   Bone cancer Father 64   Breast cancer Sister 5   Heart attack Sister    Diabetes Sister    Breast cancer Maternal  Aunt        dx >50   Cancer Maternal Aunt        NOS   Breast cancer Other        MGF's sister #1   Hyperlipidemia Other    Hypertension Other    Thyroid disease Other    Stomach cancer Neg Hx    Rectal cancer Neg Hx    Pancreatic cancer Neg Hx    Colon cancer Neg Hx    Social History   Socioeconomic History   Marital status: Single    Spouse name: Not on file   Number of children: 1   Years of education: Not on file   Highest education  level: Not on file  Occupational History   Not on file  Tobacco Use   Smoking status: Former    Packs/day: 1.00    Years: 15.00    Pack years: 15.00    Types: Cigarettes    Quit date: 04/21/1983    Years since quitting: 38.5   Smokeless tobacco: Never  Vaping Use   Vaping Use: Never used  Substance and Sexual Activity   Alcohol use: Yes    Comment: socially   Drug use: No   Sexual activity: Not on file  Other Topics Concern   Not on file  Social History Narrative   Not on file   Social Determinants of Health   Financial Resource Strain: Low Risk    Difficulty of Paying Living Expenses: Not hard at all  Food Insecurity: No Food Insecurity   Worried About Programme researcher, broadcasting/film/video in the Last Year: Never true   Ran Out of Food in the Last Year: Never true  Transportation Needs: No Transportation Needs   Lack of Transportation (Medical): No   Lack of Transportation (Non-Medical): No  Physical Activity: Inactive   Days of Exercise per Week: 0 days   Minutes of Exercise per Session: 0 min  Stress: No Stress Concern Present   Feeling of Stress : Not at all  Social Connections: Moderately Integrated   Frequency of Communication with Friends and Family: Twice a week   Frequency of Social Gatherings with Friends and Family: Twice a week   Attends Religious Services: More than 4 times per year   Active Member of [employer] or Organizations: Yes   Attends Engineer, structural: More than 4 times per year   Marital Status: Never married    Tobacco Counseling Counseling given: Not Answered   Clinical Intake:  Pre-visit preparation completed: Yes  Pain : No/denies pain     Nutritional Risks: None Diabetes: No  How often do you need to have someone help you when you read instructions, pamphlets, or other written materials from your doctor or pharmacy?: 1 - Never What is the last grade level you completed in school?: BS  Diabetic?no     Information entered by ::  L.Liyah Higham,LPN   Activities of Daily Living In your present state of health, do you have any difficulty performing the following activities: 10/12/2021  Hearing? N  Vision? N  Difficulty concentrating or making decisions? N  Walking or climbing stairs? N  Dressing or bathing? N  Doing errands, shopping? N  Preparing Food and eating ? N  Using the Toilet? N  In the past six months, have you accidently leaked urine? N  Do you have problems with loss of bowel control? N  Managing your Medications? N  Managing your Finances? N  Housekeeping or  managing your Housekeeping? N  Some recent data might be hidden    Patient Care Team: Shirline Frees, NP as PCP - General (Family Medicine)  Indicate any recent Medical Services you may have received from other than [facility] providers in the past year (date may be approximate).     Assessment:   This is a routine wellness examination for Tamara Hughes.  Hearing/Vision screen Vision Screening - Comments:: Annual eye exams wear glasses   Dietary issues and exercise activities discussed: Current Exercise Habits: The patient does not participate in regular exercise at present, Exercise limited by: None identified   Goals Addressed             This Visit's Progress    Exercise 150 min/wk Moderate Activity   On track    Plans to start on October 1st        Depression Screen PHQ 2/9 Scores 10/12/2021 10/12/2021 09/22/2020 09/24/2019 09/24/2017 09/13/2016 09/28/2015  PHQ - 2 Score 0 0 0 0 0 0 0  PHQ- 9 Score - - 0 - - - -    Fall Risk Fall Risk  10/12/2021 09/22/2020 09/24/2019 09/24/2017 09/13/2016  Falls in the past year? 0 1 0 No No  Number falls in past yr: 0 0 - - -  Injury with Fall? 0 0 - - -  Risk for fall due to : - Orthopedic patient - - -  Follow up Falls evaluation completed Falls evaluation completed;Falls prevention discussed - - -    FALL RISK PREVENTION PERTAINING TO THE HOME:  Any stairs in or around the home? Yes  If so, are there  any without handrails? No  Home free of loose throw rugs in walkways, pet beds, electrical cords, etc? Yes  Adequate lighting in your home to reduce risk of falls? Yes   ASSISTIVE DEVICES UTILIZED TO PREVENT FALLS:  Life alert? No  Use of a cane, walker or w/c? No  Grab bars in the bathroom? Yes  Shower chair or bench in shower? Yes  Elevated toilet seat or a handicapped toilet? No    Cognitive Function:    Normal cognitive status assessed by direct observation by this Nurse Health Advisor. No abnormalities found.      Immunizations Immunization History  Administered Date(s) Administered   Fluad Quad(high Dose 65+) 09/24/2019, 10/20/2020   Influenza Split 09/19/2012   Influenza, High Dose Seasonal PF 09/28/2015, 09/13/2016, 09/24/2017   Influenza,inj,Quad PF,6+ Mos 09/24/2014, 09/16/2018   Moderna Sars-Covid-2 Vaccination 02/16/2020, 03/23/2020   Pneumococcal Conjugate-13 09/24/2017   Pneumococcal Polysaccharide-23 09/24/2019   Td 12/25/2004, 09/28/2015    TDAP status: Up to date  Flu Vaccine status: Up to date  Pneumococcal vaccine status: Up to date  Covid-19 vaccine status: Completed vaccines  Qualifies for Shingles Vaccine? Yes   Zostavax completed Yes   Shingrix Completed?: Yes  Screening Tests Health Maintenance  Topic Date Due   Hepatitis C Screening  Never done   Zoster Vaccines- Shingrix (1 of 2) Never done   COVID-19 Vaccine (3 - Booster for Moderna series) 08/23/2020   INFLUENZA VACCINE  07/25/2021   MAMMOGRAM  09/12/2022   TETANUS/TDAP  09/27/2025   COLONOSCOPY (Pts 45-11yrs Insurance coverage will need to be confirmed)  12/28/2027   Pneumonia Vaccine 45+ Years old  Completed   DEXA SCAN  Completed   HPV VACCINES  Aged Out    Health Maintenance  Health Maintenance Due  Topic Date Due   Hepatitis C Screening  Never done  Zoster Vaccines- Shingrix (1 of 2) Never done   COVID-19 Vaccine (3 - Booster for Moderna series) 08/23/2020   INFLUENZA  VACCINE  07/25/2021  Per patient obtain Shingrix vaccine 02/2019 /03/2019 [facility] .Both doses received per patient.  Colorectal cancer screening: Type of screening: Colonoscopy. Completed 12/27/2017. Repeat every 10 years  Mammogram status: Completed 09/12/2021. Repeat every year  Bone Density status: Completed 09/29/2019. Results reflect: Bone density results: OSTEOPENIA. Repeat every 5 years.  Lung Cancer Screening: (Low Dose CT Chest recommended if Age 51-80 years, 30 pack-year currently smoking OR have quit w/in 15years.) does not qualify.   Lung Cancer Screening Referral: n/a  Additional Screening:  Hepatitis C Screening: does qualify;  Vision Screening: Recommended annual ophthalmology exams for early detection of glaucoma and other disorders of the eye. Is the patient up to date with their annual eye exam?  Yes  Who is the provider or what is the name of the office in which the patient attends annual eye exams? Dr.Gould If pt is not established with a provider, would they like to be referred to a provider to establish care? No .   Dental Screening: Recommended annual dental exams for proper oral hygiene  Community Resource Referral / Chronic Care Management: CRR required this visit?  No   CCM required this visit?  No      Plan:     I have personally reviewed and noted the following in the patient's chart:   Medical and social history Use of alcohol, tobacco or illicit drugs  Current medications and supplements including opioid prescriptions.  Functional ability and status Nutritional status Physical activity Advanced directives List of other physicians Hospitalizations, surgeries, and ER visits in previous 12 months Vitals Screenings to include cognitive, depression, and falls Referrals and appointments  In addition, I have reviewed and discussed with patient certain preventive protocols, quality metrics, and best practice recommendations. A written  personalized care plan for preventive services as well as general preventive health recommendations were provided to patient.     March Rummage, LPN   [id number]   Nurse Notes: none

## 2021-10-12 NOTE — Patient Instructions (Signed)
Tamara Hughes , Thank you for taking time to come for your Medicare Wellness Visit. I appreciate your ongoing commitment to your health goals. Please review the following plan we discussed and let me know if I can assist you in the future.   Screening recommendations/referrals: Colonoscopy: 12/27/2017  due 2029 Mammogram: 09/12/2021 Bone Density: 09/26/2019 Recommended yearly ophthalmology/optometry visit for glaucoma screening and checkup Recommended yearly dental visit for hygiene and checkup  Vaccinations: Influenza vaccine: completed Pneumococcal vaccine: Completed  Tdap vaccine: 09/28/2015 Shingles vaccine: completed per patient [facility]     Advanced directives: none   Conditions/risks identified: none   Next appointment: 11/0/2022 1000 am Shirline Frees    Preventive Care 72 Years and Older, Female Preventive care refers to lifestyle choices and visits with your health care provider that can promote health and wellness. What does preventive care include? A yearly physical exam. This is also called an annual well check. Dental exams once or twice a year. Routine eye exams. Ask your health care provider how often you should have your eyes checked. Personal lifestyle choices, including: Daily care of your teeth and gums. Regular physical activity. Eating a healthy diet. Avoiding tobacco and drug use. Limiting alcohol use. Practicing safe sex. Taking low-dose aspirin every day. Taking vitamin and mineral supplements as recommended by your health care provider. What happens during an annual well check? The services and screenings done by your health care provider during your annual well check will depend on your age, overall health, lifestyle risk factors, and family history of disease. Counseling  Your health care provider may ask you questions about your: Alcohol use. Tobacco use. Drug use. Emotional well-being. Home and relationship well-being. Sexual activity. Eating  habits. History of falls. Memory and ability to understand (cognition). Work and work Astronomer. Reproductive health. Screening  You may have the following tests or measurements: Height, weight, and BMI. Blood pressure. Lipid and cholesterol levels. These may be checked every 5 years, or more frequently if you are over 60 years old. Skin check. Lung cancer screening. You may have this screening every year starting at age 72 if you have a 30-pack-year history of smoking and currently smoke or have quit within the past 15 years. Fecal occult blood test (FOBT) of the stool. You may have this test every year starting at age 72. Flexible sigmoidoscopy or colonoscopy. You may have a sigmoidoscopy every 5 years or a colonoscopy every 10 years starting at age 72. Hepatitis C blood test. Hepatitis B blood test. Sexually transmitted disease (STD) testing. Diabetes screening. This is done by checking your blood sugar (glucose) after you have not eaten for a while (fasting). You may have this done every 1-3 years. Bone density scan. This is done to screen for osteoporosis. You may have this done starting at age 72. Mammogram. This may be done every 1-2 years. Talk to your health care provider about how often you should have regular mammograms. Talk with your health care provider about your test results, treatment options, and if necessary, the need for more tests. Vaccines  Your health care provider may recommend certain vaccines, such as: Influenza vaccine. This is recommended every year. Tetanus, diphtheria, and acellular pertussis (Tdap, Td) vaccine. You may need a Td booster every 10 years. Zoster vaccine. You may need this after age 72. Pneumococcal 13-valent conjugate (PCV13) vaccine. One dose is recommended after age 72. Pneumococcal polysaccharide (PPSV23) vaccine. One dose is recommended after age 72. Talk to your health care provider about which  screenings and vaccines you need and how  often you need them. This information is not intended to replace advice given to you by your health care provider. Make sure you discuss any questions you have with your health care provider. Document Released: 01/07/2016 Document Revised: 08/30/2016 Document Reviewed: 10/12/2015 Elsevier Interactive Patient Education  2017 [employer] Prevention in the Home Falls can cause injuries. They can happen to people of all ages. There are many things you can do to make your home safe and to help prevent falls. What can I do on the outside of my home? Regularly fix the edges of walkways and driveways and fix any cracks. Remove anything that might make you trip as you walk through a door, such as a raised step or threshold. Trim any bushes or trees on the path to your home. Use bright outdoor lighting. Clear any walking paths of anything that might make someone trip, such as rocks or tools. Regularly check to see if handrails are loose or broken. Make sure that both sides of any steps have handrails. Any raised decks and porches should have guardrails on the edges. Have any leaves, snow, or ice cleared regularly. Use sand or salt on walking paths during winter. Clean up any spills in your garage right away. This includes oil or grease spills. What can I do in the bathroom? Use night lights. Install grab bars by the toilet and in the tub and shower. Do not use towel bars as grab bars. Use non-skid mats or decals in the tub or shower. If you need to sit down in the shower, use a plastic, non-slip stool. Keep the floor dry. Clean up any water that spills on the floor as soon as it happens. Remove soap buildup in the tub or shower regularly. Attach bath mats securely with double-sided non-slip rug tape. Do not have throw rugs and other things on the floor that can make you trip. What can I do in the bedroom? Use night lights. Make sure that you have a light by your bed that is easy to  reach. Do not use any sheets or blankets that are too big for your bed. They should not hang down onto the floor. Have a firm chair that has side arms. You can use this for support while you get dressed. Do not have throw rugs and other things on the floor that can make you trip. What can I do in the kitchen? Clean up any spills right away. Avoid walking on wet floors. Keep items that you use a lot in easy-to-reach places. If you need to reach something above you, use a strong step stool that has a grab bar. Keep electrical cords out of the way. Do not use floor polish or wax that makes floors slippery. If you must use wax, use non-skid floor wax. Do not have throw rugs and other things on the floor that can make you trip. What can I do with my stairs? Do not leave any items on the stairs. Make sure that there are handrails on both sides of the stairs and use them. Fix handrails that are broken or loose. Make sure that handrails are as long as the stairways. Check any carpeting to make sure that it is firmly attached to the stairs. Fix any carpet that is loose or worn. Avoid having throw rugs at the top or bottom of the stairs. If you do have throw rugs, attach them to the floor with carpet  tape. Make sure that you have a light switch at the top of the stairs and the bottom of the stairs. If you do not have them, ask someone to add them for you. What else can I do to help prevent falls? Wear shoes that: Do not have high heels. Have rubber bottoms. Are comfortable and fit you well. Are closed at the toe. Do not wear sandals. If you use a stepladder: Make sure that it is fully opened. Do not climb a closed stepladder. Make sure that both sides of the stepladder are locked into place. Ask someone to hold it for you, if possible. Clearly mark and make sure that you can see: Any grab bars or handrails. First and last steps. Where the edge of each step is. Use tools that help you move  around (mobility aids) if they are needed. These include: Canes. Walkers. Scooters. Crutches. Turn on the lights when you go into a dark area. Replace any light bulbs as soon as they burn out. Set up your furniture so you have a clear path. Avoid moving your furniture around. If any of your floors are uneven, fix them. If there are any pets around you, be aware of where they are. Review your medicines with your doctor. Some medicines can make you feel dizzy. This can increase your chance of falling. Ask your doctor what other things that you can do to help prevent falls. This information is not intended to replace advice given to you by your health care provider. Make sure you discuss any questions you have with your health care provider. Document Released: 10/07/2009 Document Revised: 05/18/2016 Document Reviewed: 01/15/2015 Elsevier Interactive Patient Education  2017 [employer].

## 2021-10-21 ENCOUNTER — Encounter: Payer: Medicare PPO | Admitting: Adult Health

## 2021-10-31 ENCOUNTER — Other Ambulatory Visit: Payer: Self-pay

## 2021-11-01 ENCOUNTER — Encounter: Payer: Self-pay | Admitting: Adult Health

## 2021-11-01 ENCOUNTER — Ambulatory Visit (INDEPENDENT_AMBULATORY_CARE_PROVIDER_SITE_OTHER): Payer: Medicare PPO | Admitting: Adult Health

## 2021-11-01 VITALS — BP 160/88 | HR 70 | Temp 98.5°F | Ht 63.5 in | Wt 164.0 lb

## 2021-11-01 DIAGNOSIS — M545 Low back pain, unspecified: Secondary | ICD-10-CM

## 2021-11-01 DIAGNOSIS — Z1159 Encounter for screening for other viral diseases: Secondary | ICD-10-CM

## 2021-11-01 DIAGNOSIS — E785 Hyperlipidemia, unspecified: Secondary | ICD-10-CM

## 2021-11-01 DIAGNOSIS — Z23 Encounter for immunization: Secondary | ICD-10-CM | POA: Diagnosis not present

## 2021-11-01 DIAGNOSIS — F419 Anxiety disorder, unspecified: Secondary | ICD-10-CM | POA: Diagnosis not present

## 2021-11-01 DIAGNOSIS — F32A Depression, unspecified: Secondary | ICD-10-CM | POA: Diagnosis not present

## 2021-11-01 DIAGNOSIS — Z0000 Encounter for general adult medical examination without abnormal findings: Secondary | ICD-10-CM | POA: Diagnosis not present

## 2021-11-01 LAB — LIPID PANEL
Cholesterol: 157 mg/dL (ref 0–200)
HDL: 54.6 mg/dL (ref >39.00)
LDL Cholesterol: 78 mg/dL (ref 0–99)
NonHDL: 102.23
Total CHOL/HDL Ratio: 3
Triglycerides: 119 mg/dL (ref 0.0–149.0)
VLDL: 23.8 mg/dL (ref 0.0–40.0)

## 2021-11-01 LAB — COMPREHENSIVE METABOLIC PANEL
ALT: 12 U/L (ref 0–35)
AST: 16 U/L (ref 0–37)
Albumin: 4.2 g/dL (ref 3.5–5.2)
Alkaline Phosphatase: 59 U/L (ref 39–117)
BUN: 11 mg/dL (ref 6–23)
CO2: 31 mEq/L (ref 19–32)
Calcium: 9.3 mg/dL (ref 8.4–10.5)
Chloride: 103 mEq/L (ref 96–112)
Creatinine, Ser: 0.6 mg/dL (ref 0.40–1.20)
GFR: 89.86 mL/min (ref >60.00)
Glucose, Bld: 80 mg/dL (ref 70–99)
Potassium: 4.1 mEq/L (ref 3.5–5.1)
Sodium: 139 mEq/L (ref 135–145)
Total Bilirubin: 0.6 mg/dL (ref 0.2–1.2)
Total Protein: 7 g/dL (ref 6.0–8.3)

## 2021-11-01 LAB — CBC WITH DIFFERENTIAL/PLATELET
Basophils Absolute: 0.1 10*3/uL (ref 0.0–0.1)
Basophils Relative: 1 % (ref 0.0–3.0)
Eosinophils Absolute: 0.1 10*3/uL (ref 0.0–0.7)
Eosinophils Relative: 2.1 % (ref 0.0–5.0)
HCT: 40 % (ref 36.0–46.0)
Hemoglobin: 12.7 g/dL (ref 12.0–15.0)
Lymphocytes Relative: 17.6 % (ref 12.0–46.0)
Lymphs Abs: 1.2 10*3/uL (ref 0.7–4.0)
MCHC: 31.9 g/dL (ref 30.0–36.0)
MCV: 82.6 fl (ref 78.0–100.0)
Monocytes Absolute: 0.7 10*3/uL (ref 0.1–1.0)
Monocytes Relative: 9.4 % (ref 3.0–12.0)
Neutro Abs: 4.9 10*3/uL (ref 1.4–7.7)
Neutrophils Relative %: 69.9 % (ref 43.0–77.0)
Platelets: 262 10*3/uL (ref 150.0–400.0)
RBC: 4.84 Mil/uL (ref 3.87–5.11)
RDW: 14.4 % (ref 11.5–15.5)
WBC: 7 10*3/uL (ref 4.0–10.5)

## 2021-11-01 LAB — HEMOGLOBIN A1C: Hgb A1c MFr Bld: 6 % (ref 4.6–6.5)

## 2021-11-01 LAB — TSH: TSH: 1.1 u[IU]/mL (ref 0.35–5.50)

## 2021-11-01 MED ORDER — SIMVASTATIN 40 MG PO TABS: ORAL_TABLET | ORAL | 3 refills | Status: DC

## 2021-11-01 MED ORDER — CITALOPRAM HYDROBROMIDE 40 MG PO TABS: ORAL_TABLET | ORAL | 1 refills | Status: DC

## 2021-11-01 MED ORDER — CYCLOBENZAPRINE HCL 10 MG PO TABS: 10.0000 mg | ORAL_TABLET | Freq: Three times a day (TID) | ORAL | 0 refills | Status: DC | PRN

## 2021-11-01 MED ORDER — LORAZEPAM 0.5 MG PO TABS: ORAL_TABLET | ORAL | 2 refills | Status: DC

## 2021-11-01 NOTE — Patient Instructions (Signed)
It was great seeing you today   We will follow up with you regarding your blood work   I have sent in a muscle relaxer to help with your back pain

## 2021-11-01 NOTE — Progress Notes (Signed)
Subjective:    Patient ID: Tamara Hughes, female    DOB: 12/14/1951, 70 y.o.   MRN: [id number]  HPI Patient presents for yearly preventative medicine examination. She is a pleasant 70 year old female who  has a past medical history of Allergy, Anxiety, Arthritis, Depression, Family history of breast cancer, and Hyperlipidemia.  Anxiety/depression-is currently prescribed Celexa 40 mg nightly and Ativan 0.5 mg twice daily as needed.  She does feel well controlled on this regimen.  Hyperlipidemia-takes Zocor 40 mg nightly. She denies myalgia or fatigue.   Lab Results  Component Value Date   CHOL 172 10/20/2020   HDL 54 10/20/2020   LDLCALC 96 10/20/2020   TRIG 121 10/20/2020   CHOLHDL 3.2 10/20/2020   History of elevated blood pressure readings-she does monitor blood pressures at home and reports readings in the 120s over 80s.  Her blood pressure usually goes up when she comes to her medical appointments  BP Readings from Last 3 Encounters:  11/01/21 (!) 160/88  10/20/20 (!) 142/80  09/04/20 (!) 143/84   Osteoarthritis of bilateral knees -has been seen by orthopedics, Dr. Cleophas Dunker in the past.  Has had knee injections without resolution.  Last strays of her right knee showed advanced tricompartmental osteoarthritis.  She takes Tylenol as needed.  Over the last few months she has had worsening right lower back pain  All immunizations and health maintenance protocols were reviewed with the patient and needed orders were placed.  Appropriate screening laboratory values were ordered for the patient including screening of hyperlipidemia, renal function and hepatic function.   Medication reconciliation,  past medical history, social history, problem list and allergies were reviewed in detail with the patient  Goals were established with regard to weight loss, exercise, and  diet in compliance with medications  Wt Readings from Last 3 Encounters:  11/01/21 164 lb (74.4 kg)  10/20/20 162  lb 3.2 oz (73.6 kg)  09/04/20 165 lb (74.8 kg)   She is up to date on mammograms and routine colon cancer screening.   Review of Systems  Constitutional: Negative.   HENT:  Positive for hearing loss (chronic).   Eyes: Negative.   Respiratory: Negative.    Cardiovascular: Negative.   Gastrointestinal: Negative.   Endocrine: Negative.   Genitourinary: Negative.   Musculoskeletal:  Positive for arthralgias and back pain.  Skin: Negative.   Allergic/Immunologic: Negative.   Neurological: Negative.   Hematological: Negative.   Psychiatric/Behavioral: Negative.     Past Medical History:  Diagnosis Date   Allergy    Anxiety    Arthritis    Depression    Family history of breast cancer    Hyperlipidemia     Social History   Socioeconomic History   Marital status: Single    Spouse name: Not on file   Number of children: 1   Years of education: Not on file   Highest education level: Not on file  Occupational History   Not on file  Tobacco Use   Smoking status: Former    Packs/day: 1.00    Years: 15.00    Pack years: 15.00    Types: Cigarettes    Quit date: 04/21/1983    Years since quitting: 38.5   Smokeless tobacco: Never  Vaping Use   Vaping Use: Never used  Substance and Sexual Activity   Alcohol use: Yes    Comment: socially   Drug use: No   Sexual activity: Not on file  Other Topics Concern  Not on file  Social History Narrative   Not on file   Social Determinants of Health   Financial Resource Strain: Low Risk    Difficulty of Paying Living Expenses: Not hard at all  Food Insecurity: No Food Insecurity   Worried About Programme researcher, broadcasting/film/video in the Last Year: Never true   Barista in the Last Year: Never true  Transportation Needs: No Transportation Needs   Lack of Transportation (Medical): No   Lack of Transportation (Non-Medical): No  Physical Activity: Inactive   Days of Exercise per Week: 0 days   Minutes of Exercise per Session: 0 min   Stress: No Stress Concern Present   Feeling of Stress : Not at all  Social Connections: Moderately Integrated   Frequency of Communication with Friends and Family: Twice a week   Frequency of Social Gatherings with Friends and Family: Twice a week   Attends Religious Services: More than 4 times per year   Active Member of [employer] or Organizations: Yes   Attends Engineer, structural: More than 4 times per year   Marital Status: Never married  Catering manager Violence: Not At Risk   Fear of Current or Ex-Partner: No   Emotionally Abused: No   Physically Abused: No   Sexually Abused: No    Past Surgical History:  Procedure Laterality Date   GUM SURGERY     HYSTEROSCOPY     KNEE SURGERY Right     Family History  Problem Relation Age of Onset   Breast cancer Mother 85   Bone cancer Father 35   Breast cancer Sister 73   Heart attack Sister    Diabetes Sister    Breast cancer Maternal Aunt        dx >50   Cancer Maternal Aunt        NOS   Breast cancer Other        MGF's sister #1   Hyperlipidemia Other    Hypertension Other    Thyroid disease Other    Stomach cancer Neg Hx    Rectal cancer Neg Hx    Pancreatic cancer Neg Hx    Colon cancer Neg Hx     Allergies  Allergen Reactions   Doxycycline     REACTION: rash   Ether    Indomethacin     REACTION: upset stomach   Penicillins     REACTION: Hives    Current Outpatient Medications on File Prior to Visit  Medication Sig Dispense Refill   citalopram (CELEXA) 40 MG tablet TAKE 1 TABLET(40 MG) BY MOUTH DAILY 90 tablet 1   fluticasone (FLONASE) 50 MCG/ACT nasal spray INHALE 2 SPRAYS INTO BOTH NOSTRILS ONCE DAILY 16 g 6   LORazepam (ATIVAN) 0.5 MG tablet take 1 tablet by mouth twice a day if needed 30 tablet 2   Multiple Vitamin (MULTIVITAMIN) tablet Take 1 tablet by mouth daily.     OVER THE COUNTER MEDICATION Place 2 drops into both eyes daily as needed (dry eyes).     pseudoephedrine (SUDAFED) 30 MG  tablet Take 1 tablet (30 mg total) by mouth every 8 (eight) hours as needed for congestion. 30 tablet 0   RESTASIS 0.05 % ophthalmic emulsion INT 1 DROP AEY BID  3   simvastatin (ZOCOR) 40 MG tablet TAKE 1 TABLET(40 MG) BY MOUTH AT BEDTIME 90 tablet 3   vitamin C (ASCORBIC ACID) 250 MG tablet Take 500 mg by mouth daily.  No current facility-administered medications on file prior to visit.    BP (!) 160/88   Pulse 70   Temp 98.5 F (36.9 C) (Oral)   Ht 5' 3.5" (1.613 m)   Wt 164 lb (74.4 kg)   SpO2 99%   BMI 28.60 kg/m        Objective:   Physical Exam Vitals and nursing note reviewed.  Constitutional:      General: She is not in acute distress.    Appearance: Normal appearance. She is well-developed. She is not ill-appearing.  HENT:     Head: Normocephalic and atraumatic.     Right Ear: Tympanic membrane, ear canal and external ear normal. There is no impacted cerumen.     Left Ear: Tympanic membrane, ear canal and external ear normal. There is no impacted cerumen.     Nose: Nose normal. No congestion or rhinorrhea.     Mouth/Throat:     Mouth: Mucous membranes are moist.     Pharynx: Oropharynx is clear. No oropharyngeal exudate or posterior oropharyngeal erythema.  Eyes:     General:        Right eye: No discharge.        Left eye: No discharge.     Extraocular Movements: Extraocular movements intact.     Conjunctiva/sclera: Conjunctivae normal.     Pupils: Pupils are equal, round, and reactive to light.  Neck:     Thyroid: No thyromegaly.     Vascular: No carotid bruit.     Trachea: No tracheal deviation.  Cardiovascular:     Rate and Rhythm: Normal rate and regular rhythm.     Pulses: Normal pulses.     Heart sounds: Normal heart sounds. No murmur heard.   No friction rub. No gallop.  Pulmonary:     Effort: Pulmonary effort is normal. No respiratory distress.     Breath sounds: Normal breath sounds. No stridor. No wheezing, rhonchi or rales.  Chest:      Chest wall: No tenderness.  Abdominal:     General: Abdomen is flat. Bowel sounds are normal. There is no distension.     Palpations: Abdomen is soft. There is no mass.     Tenderness: There is no abdominal tenderness. There is no right CVA tenderness, left CVA tenderness, guarding or rebound.     Hernia: No hernia is present.  Musculoskeletal:        General: No swelling, deformity or signs of injury. Normal range of motion.     Cervical back: Normal range of motion and neck supple.     Lumbar back: Tenderness present. No deformity, signs of trauma, spasms or bony tenderness. Normal range of motion.       Back:     Right lower leg: No edema.     Left lower leg: No edema.  Lymphadenopathy:     Cervical: No cervical adenopathy.  Skin:    General: Skin is warm and dry.     Coloration: Skin is not jaundiced or pale.     Findings: No bruising, erythema, lesion or rash.  Neurological:     General: No focal deficit present.     Mental Status: She is alert and oriented to person, place, and time.     Cranial Nerves: No cranial nerve deficit.     Sensory: No sensory deficit.     Motor: No weakness.     Coordination: Coordination normal.     Gait: Gait normal.  Deep Tendon Reflexes: Reflexes normal.  Psychiatric:        Mood and Affect: Mood normal.        Behavior: Behavior normal.        Thought Content: Thought content normal.        Judgment: Judgment normal.      Assessment & Plan:  1. Routine general medical examination at a health care facility - Encouraged lifestyle modifications to help with weight loss  - Follow up in one year or sooner if needed - CBC with Differential/Platelet; Future - Comprehensive metabolic panel; Future - Lipid panel; Future - TSH; Future - Hemoglobin A1c; Future - Hemoglobin A1c - TSH - Lipid panel - Comprehensive metabolic panel - CBC with Differential/Platelet  2. Hyperlipidemia, unspecified hyperlipidemia type  - CBC with  Differential/Platelet; Future - Comprehensive metabolic panel; Future - Lipid panel; Future - TSH; Future - Hemoglobin A1c; Future - simvastatin (ZOCOR) 40 MG tablet; TAKE 1 TABLET(40 MG) BY MOUTH AT BEDTIME  Dispense: 90 tablet; Refill: 3 - Hemoglobin A1c - TSH - Lipid panel - Comprehensive metabolic panel - CBC with Differential/Platelet  3. Anxiety and depression - Well controlled.  - LORazepam (ATIVAN) 0.5 MG tablet; take 1 tablet by mouth twice a day if needed  Dispense: 30 tablet; Refill: 2 - citalopram (CELEXA) 40 MG tablet; TAKE 1 TABLET(40 MG) BY MOUTH DAILY  Dispense: 90 tablet; Refill: 1  4. Need for hepatitis C screening test  - Hep C Antibody; Future - Hep C Antibody  5. Need for immunization against influenza  - Flu Vaccine QUAD High Dose(Fluad)  6. Acute right-sided low back pain without sciatica  - cyclobenzaprine (FLEXERIL) 10 MG tablet; Take 1 tablet (10 mg total) by mouth 3 (three) times daily as needed for muscle spasms.  Dispense: 30 tablet; Refill: 0  Shirline Frees, NP

## 2021-11-02 LAB — HEPATITIS C ANTIBODY
Hepatitis C Ab: NONREACTIVE
SIGNAL TO CUT-OFF: 0.07 (ref <1.00)

## 2021-11-21 DIAGNOSIS — H10413 Chronic giant papillary conjunctivitis, bilateral: Secondary | ICD-10-CM | POA: Diagnosis not present

## 2021-11-21 DIAGNOSIS — H16223 Keratoconjunctivitis sicca, not specified as Sjogren's, bilateral: Secondary | ICD-10-CM | POA: Diagnosis not present

## 2022-05-02 DIAGNOSIS — H903 Sensorineural hearing loss, bilateral: Secondary | ICD-10-CM | POA: Diagnosis not present

## 2022-06-13 ENCOUNTER — Encounter: Payer: Self-pay | Admitting: Adult Health

## 2022-06-13 ENCOUNTER — Ambulatory Visit: Payer: Medicare PPO | Admitting: Adult Health

## 2022-06-13 VITALS — BP 138/88 | HR 72 | Temp 98.5°F | Ht 63.5 in | Wt 157.0 lb

## 2022-06-13 DIAGNOSIS — J988 Other specified respiratory disorders: Secondary | ICD-10-CM | POA: Diagnosis not present

## 2022-06-13 MED ORDER — BENZONATATE 200 MG PO CAPS: 200.0000 mg | ORAL_CAPSULE | Freq: Two times a day (BID) | ORAL | 1 refills | Status: DC | PRN

## 2022-06-13 MED ORDER — PREDNISONE 10 MG PO TABS
ORAL_TABLET | ORAL | 0 refills | Status: DC
Start: 2022-06-13 — End: 2022-11-02

## 2022-06-13 MED ORDER — AZITHROMYCIN 250 MG PO TABS: ORAL_TABLET | ORAL | 0 refills | Status: AC

## 2022-06-13 MED ORDER — FLUCONAZOLE 150 MG PO TABS: 150.0000 mg | ORAL_TABLET | Freq: Every day | ORAL | 1 refills | Status: DC

## 2022-06-13 NOTE — Progress Notes (Signed)
Subjective:    Patient ID: Tamara Hughes, female    DOB: 09-16-1951, 71 y.o.   MRN: [id number]  HPI 71 year old female who  has a past medical history of Allergy, Anxiety, Arthritis, Depression, Family history of breast cancer, and Hyperlipidemia.  She presents to the office today for an acute issue  Her symptoms started three weeks ago with a productive cough, rhinorrhea, nasal and chest congestion, subjective fever, chills.   She denies headaches, sinus congestion, shortness of breath, or wheezing.   She has been using OTC cough syrup and mucinex which have not been helping.   Review of Systems See HPI   Past Medical History:  Diagnosis Date   Allergy    Anxiety    Arthritis    Depression    Family history of breast cancer    Hyperlipidemia     Social History   Socioeconomic History   Marital status: Single    Spouse name: Not on file   Number of children: 1   Years of education: Not on file   Highest education level: Not on file  Occupational History   Not on file  Tobacco Use   Smoking status: Former    Packs/day: 1.00    Years: 15.00    Total pack years: 15.00    Types: Cigarettes    Quit date: 04/21/1983    Years since quitting: 39.1   Smokeless tobacco: Never  Vaping Use   Vaping Use: Never used  Substance and Sexual Activity   Alcohol use: Yes    Comment: socially   Drug use: No   Sexual activity: Not on file  Other Topics Concern   Not on file  Social History Narrative   Not on file   Social Determinants of Health   Financial Resource Strain: Low Risk  (10/12/2021)   Overall Financial Resource Strain (CARDIA)    Difficulty of Paying Living Expenses: Not hard at all  Food Insecurity: No Food Insecurity (10/12/2021)   Hunger Vital Sign    Worried About Running Out of Food in the Last Year: Never true    Ran Out of Food in the Last Year: Never true  Transportation Needs: No Transportation Needs (10/12/2021)   PRAPARE - Doctor, general practice (Medical): No    Lack of Transportation (Non-Medical): No  Physical Activity: Inactive (10/12/2021)   Exercise Vital Sign    Days of Exercise per Week: 0 days    Minutes of Exercise per Session: 0 min  Stress: No Stress Concern Present (10/12/2021)   [employer] of Occupational Health - Occupational Stress Questionnaire    Feeling of Stress : Not at all  Social Connections: Moderately Integrated (10/12/2021)   Social Connection and Isolation Panel [NHANES]    Frequency of Communication with Friends and Family: Twice a week    Frequency of Social Gatherings with Friends and Family: Twice a week    Attends Religious Services: More than 4 times per year    Active Member of [employer] or Organizations: Yes    Attends Banker Meetings: More than 4 times per year    Marital Status: Never married  Intimate Partner Violence: Not At Risk (10/12/2021)   Humiliation, Afraid, Rape, and Kick questionnaire    Fear of Current or Ex-Partner: No    Emotionally Abused: No    Physically Abused: No    Sexually Abused: No    Past Surgical History:  Procedure Laterality Date  GUM SURGERY     HYSTEROSCOPY     KNEE SURGERY Right     Family History  Problem Relation Age of Onset   Breast cancer Mother 83   Bone cancer Father 69   Breast cancer Sister 3   Heart attack Sister    Diabetes Sister    Breast cancer Maternal Aunt        dx >50   Cancer Maternal Aunt        NOS   Breast cancer Other        MGF's sister #1   Hyperlipidemia Other    Hypertension Other    Thyroid disease Other    Stomach cancer Neg Hx    Rectal cancer Neg Hx    Pancreatic cancer Neg Hx    Colon cancer Neg Hx     Allergies  Allergen Reactions   Doxycycline     REACTION: rash   Ether    Indomethacin     REACTION: upset stomach   Penicillins     REACTION: Hives    Current Outpatient Medications on File Prior to Visit  Medication Sig Dispense Refill   citalopram (CELEXA)  40 MG tablet TAKE 1 TABLET(40 MG) BY MOUTH DAILY 90 tablet 1   cyclobenzaprine (FLEXERIL) 10 MG tablet Take 1 tablet (10 mg total) by mouth 3 (three) times daily as needed for muscle spasms. 30 tablet 0   fluticasone (FLONASE) 50 MCG/ACT nasal spray INHALE 2 SPRAYS INTO BOTH NOSTRILS ONCE DAILY 16 g 6   LORazepam (ATIVAN) 0.5 MG tablet take 1 tablet by mouth twice a day if needed 30 tablet 2   Multiple Vitamin (MULTIVITAMIN) tablet Take 1 tablet by mouth daily.     OVER THE COUNTER MEDICATION Place 2 drops into both eyes daily as needed (dry eyes).     pseudoephedrine (SUDAFED) 30 MG tablet Take 1 tablet (30 mg total) by mouth every 8 (eight) hours as needed for congestion. 30 tablet 0   RESTASIS 0.05 % ophthalmic emulsion INT 1 DROP AEY BID  3   simvastatin (ZOCOR) 40 MG tablet TAKE 1 TABLET(40 MG) BY MOUTH AT BEDTIME 90 tablet 3   vitamin C (ASCORBIC ACID) 250 MG tablet Take 500 mg by mouth daily.     No current facility-administered medications on file prior to visit.    BP 138/88   Pulse 72   Temp 98.5 F (36.9 C) (Oral)   Ht 5' 3.5" (1.613 m)   Wt 157 lb (71.2 kg)   SpO2 98%   BMI 27.38 kg/m       Objective:   Physical Exam Vitals and nursing note reviewed.  Constitutional:      Appearance: Normal appearance.  HENT:     Nose: Nose normal.     Mouth/Throat:     Mouth: Mucous membranes are moist.     Pharynx: Oropharynx is clear.  Cardiovascular:     Rate and Rhythm: Normal rate and regular rhythm.     Pulses: Normal pulses.     Heart sounds: Normal heart sounds.  Pulmonary:     Effort: Pulmonary effort is normal.     Breath sounds: Normal breath sounds.  Musculoskeletal:        General: Normal range of motion.  Skin:    General: Skin is warm and dry.     Capillary Refill: Capillary refill takes less than 2 seconds.  Neurological:     General: No focal deficit present.  Mental Status: She is alert and oriented to person, place, and time.  Psychiatric:         Mood and Affect: Mood normal.        Behavior: Behavior normal.        Thought Content: Thought content normal.        Judgment: Judgment normal.       Assessment & Plan:  1. Respiratory infection - Will treat due to symptoms and duration. No signs of pneumonia. She is allergic to doxycycline and PCN.  - azithromycin (ZITHROMAX) 250 MG tablet; Take 2 tablets on day 1, then 1 tablet daily on days 2 through 5  Dispense: 6 tablet; Refill: 0 - predniSONE (DELTASONE) 10 MG tablet; 40 mg x 3 days, 20 mg x 3 days, 10 mg x 3 days  Dispense: 21 tablet; Refill: 0 - fluconazole (DIFLUCAN) 150 MG tablet; Take 1 tablet (150 mg total) by mouth daily.  Dispense: 1 tablet; Refill: 1 - benzonatate (TESSALON) 200 MG capsule; Take 1 capsule (200 mg total) by mouth 2 (two) times daily as needed for cough.  Dispense: 20 capsule; Refill: 1  Shirline Frees, NP

## 2022-06-19 ENCOUNTER — Encounter: Payer: Self-pay | Admitting: Adult Health

## 2022-06-21 ENCOUNTER — Encounter: Payer: Self-pay | Admitting: Family

## 2022-06-21 ENCOUNTER — Ambulatory Visit (INDEPENDENT_AMBULATORY_CARE_PROVIDER_SITE_OTHER): Payer: Medicare PPO

## 2022-06-21 ENCOUNTER — Telehealth: Payer: Self-pay | Admitting: *Deleted

## 2022-06-21 ENCOUNTER — Ambulatory Visit: Payer: Medicare PPO | Admitting: Family

## 2022-06-21 VITALS — BP 112/70 | HR 80 | Temp 98.4°F | Ht 63.5 in | Wt 160.3 lb

## 2022-06-21 DIAGNOSIS — R051 Acute cough: Secondary | ICD-10-CM

## 2022-06-21 DIAGNOSIS — J208 Acute bronchitis due to other specified organisms: Secondary | ICD-10-CM | POA: Diagnosis not present

## 2022-06-21 DIAGNOSIS — R059 Cough, unspecified: Secondary | ICD-10-CM | POA: Diagnosis not present

## 2022-06-21 MED ORDER — HYDROCOD POLI-CHLORPHE POLI ER 10-8 MG/5ML PO SUER: 5.0000 mL | Freq: Two times a day (BID) | ORAL | 0 refills | Status: DC | PRN

## 2022-06-21 MED ORDER — BUDESONIDE-FORMOTEROL FUMARATE 160-4.5 MCG/ACT IN AERO: 2.0000 | INHALATION_SPRAY | Freq: Two times a day (BID) | RESPIRATORY_TRACT | 0 refills | Status: AC

## 2022-06-21 NOTE — Telephone Encounter (Signed)
Per Oran Rein, NP the patient was given one sample of  Symbicort 160/4.5-2 puffs BID-lot#[id number] C00-exp 11/13/2022.

## 2022-06-22 NOTE — Progress Notes (Signed)
Acute Office Visit  Subjective:     Patient ID: Tamara Hughes, female    DOB: 01/14/1951, 71 y.o.   MRN: [id number]  Chief Complaint  Patient presents with  . 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 Patient was seen by Kandee Keen NP who prescribed a Zpak and prednisone that has helped but the cough persist.  Patient reports she feels tired. She has not smoked in over 40 years.   Review of Systems  HENT:  Positive for congestion.   Respiratory:  Positive for cough. Negative for wheezing.   Cardiovascular: Negative.   Psychiatric/Behavioral: Negative.    All other systems reviewed and are negative. Past Medical History:  Diagnosis Date  . Allergy   . Anxiety   . Arthritis   . Depression   . Family history of breast cancer   . Hyperlipidemia     Social History   Socioeconomic History  . Marital status: Single    Spouse name: Not on file  . Number of children: 1  . Years of education: Not on file  . Highest education level: Not on file  Occupational History  . Not on file  Tobacco Use  . Smoking status: Former    Packs/day: 1.00    Years: 15.00    Total pack years: 15.00    Types: Cigarettes    Quit date: 04/21/1983    Years since quitting: 39.1  . Smokeless tobacco: Never  Vaping Use  . Vaping Use: Never used  Substance and Sexual Activity  . Alcohol use: Yes    Comment: socially  . Drug use: No  . Sexual activity: Not on file  Other Topics Concern  . Not on file  Social History Narrative  . Not on file   Social Determinants of Health   Financial Resource Strain: Low Risk  (10/12/2021)   Overall Financial Resource Strain (CARDIA)   . Difficulty of Paying Living Expenses: Not hard at all  Food Insecurity: No Food Insecurity (10/12/2021)   Hunger Vital Sign   . Worried About Programme researcher, broadcasting/film/video in the Last Year:  Never true   . Ran Out of Food in the Last Year: Never true  Transportation Needs: No Transportation Needs (10/12/2021)   PRAPARE - Transportation   . Lack of Transportation (Medical): No   . Lack of Transportation (Non-Medical): No  Physical Activity: Inactive (10/12/2021)   Exercise Vital Sign   . Days of Exercise per Week: 0 days   . Minutes of Exercise per Session: 0 min  Stress: No Stress Concern Present (10/12/2021)   [employer] of Occupational Health - Occupational Stress Questionnaire   . Feeling of Stress : Not at all  Social Connections: Moderately Integrated (10/12/2021)   Social Connection and Isolation Panel [NHANES]   . Frequency of Communication with Friends and Family: Twice a week   . Frequency of Social Gatherings with Friends and Family: Twice a week   . Attends Religious Services: More than 4 times per year   . Active Member of Clubs or Organizations: Yes   . Attends Banker Meetings: More than 4 times per year   . Marital Status: Never married  Intimate Partner Violence: Not At Risk (10/12/2021)   Humiliation, Afraid, Rape, and Kick questionnaire   . Fear of Current  or Ex-Partner: No   . Emotionally Abused: No   . Physically Abused: No   . Sexually Abused: No    Past Surgical History:  Procedure Laterality Date  . GUM SURGERY    . HYSTEROSCOPY    . KNEE SURGERY Right     Family History  Problem Relation Age of Onset  . Breast cancer Mother 76  . Bone cancer Father 68  . Breast cancer Sister 68  . Heart attack Sister   . Diabetes Sister   . Breast cancer Maternal Aunt        dx >50  . Cancer Maternal Aunt        NOS  . Breast cancer Other        MGF's sister #1  . Hyperlipidemia Other   . Hypertension Other   . Thyroid disease Other   . Stomach cancer Neg Hx   . Rectal cancer Neg Hx   . Pancreatic cancer Neg Hx   . Colon cancer Neg Hx     Allergies  Allergen Reactions  . Doxycycline     REACTION: rash  . Ether   .  Indomethacin     REACTION: upset stomach  . Penicillins     REACTION: Hives    Current Outpatient Medications on File Prior to Visit  Medication Sig Dispense Refill  . benzonatate (TESSALON) 200 MG capsule Take 1 capsule (200 mg total) by mouth 2 (two) times daily as needed for cough. 20 capsule 1  . citalopram (CELEXA) 40 MG tablet TAKE 1 TABLET(40 MG) BY MOUTH DAILY 90 tablet 1  . cyclobenzaprine (FLEXERIL) 10 MG tablet Take 1 tablet (10 mg total) by mouth 3 (three) times daily as needed for muscle spasms. 30 tablet 0  . fluconazole (DIFLUCAN) 150 MG tablet Take 1 tablet (150 mg total) by mouth daily. 1 tablet 1  . fluticasone (FLONASE) 50 MCG/ACT nasal spray INHALE 2 SPRAYS INTO BOTH NOSTRILS ONCE DAILY 16 g 6  . LORazepam (ATIVAN) 0.5 MG tablet take 1 tablet by mouth twice a day if needed 30 tablet 2  . Multiple Vitamin (MULTIVITAMIN) tablet Take 1 tablet by mouth daily.    [name] OVER THE COUNTER MEDICATION Place 2 drops into both eyes daily as needed (dry eyes).    . predniSONE (DELTASONE) 10 MG tablet 40 mg x 3 days, 20 mg x 3 days, 10 mg x 3 days 21 tablet 0  . pseudoephedrine (SUDAFED) 30 MG tablet Take 1 tablet (30 mg total) by mouth every 8 (eight) hours as needed for congestion. 30 tablet 0  . RESTASIS 0.05 % ophthalmic emulsion INT 1 DROP AEY BID  3  . simvastatin (ZOCOR) 40 MG tablet TAKE 1 TABLET(40 MG) BY MOUTH AT BEDTIME 90 tablet 3  . vitamin C (ASCORBIC ACID) 250 MG tablet Take 500 mg by mouth daily.     No current facility-administered medications on file prior to visit.    BP 112/70 (BP Location: Left Arm, Patient Position: Sitting, Cuff Size: Large)   Pulse 80   Temp 98.4 F (36.9 C) (Oral)   Ht 5' 3.5" (1.613 m)   Wt 160 lb 4.8 oz (72.7 kg)   SpO2 100%   BMI 27.95 kg/m chart      Objective:    BP 112/70 (BP Location: Left Arm, Patient Position: Sitting, Cuff Size: Large)   Pulse 80   Temp 98.4 F (36.9 C) (Oral)   Ht 5' 3.5" (1.613 m)   Wt 160  lb 4.8 oz  (72.7 kg)   SpO2 100%   BMI 27.95 kg/m    Physical Exam Vitals and nursing note reviewed.  Constitutional:      Appearance: Normal appearance.  HENT:     Right Ear: Tympanic membrane, ear canal and external ear normal.     Left Ear: Tympanic membrane, ear canal and external ear normal.     Mouth/Throat:     Mouth: Mucous membranes are moist.  Cardiovascular:     Rate and Rhythm: Normal rate and regular rhythm.  Pulmonary:     Effort: Pulmonary effort is normal.     Breath sounds: Normal breath sounds. No wheezing.  Abdominal:     General: Abdomen is flat.     Palpations: Abdomen is soft.  Musculoskeletal:        General: Normal range of motion.  Skin:    General: Skin is warm and dry.  Neurological:     General: No focal deficit present.     Mental Status: She is alert and oriented to person, place, and time.  Psychiatric:        Mood and Affect: Mood normal.        Behavior: Behavior normal.   No results found for any visits on 06/21/22.      Assessment & Plan:   Problem List Items Addressed This Visit   None Visit Diagnoses     Acute cough    -  Primary   Relevant Orders   DG Chest 2 View (Completed)   Acute viral bronchitis       Relevant Orders   DG Chest 2 View (Completed)       Meds ordered this encounter  Medications  . budesonide-formoterol (SYMBICORT) 160-4.5 MCG/ACT inhaler    Sig: Inhale 2 puffs into the lungs 2 (two) times daily.    Dispense:  1 each    Refill:  0  . chlorpheniramine-HYDROcodone (TUSSIONEX PENNKINETIC ER) 10-8 MG/5ML    Sig: Take 5 mLs by mouth every 12 (twelve) hours as needed for cough.    Dispense:  115 mL    Refill:  0   Call the office if symptoms worsen or persist. Recheck as scheduled and sooner as needed. No follow-ups on file.  Eulis Foster, FNP

## 2022-07-28 ENCOUNTER — Other Ambulatory Visit: Payer: Self-pay | Admitting: Adult Health

## 2022-07-28 DIAGNOSIS — F419 Anxiety disorder, unspecified: Secondary | ICD-10-CM

## 2022-07-31 ENCOUNTER — Other Ambulatory Visit: Payer: Self-pay | Admitting: Adult Health

## 2022-07-31 DIAGNOSIS — Z1231 Encounter for screening mammogram for malignant neoplasm of breast: Secondary | ICD-10-CM

## 2022-09-13 ENCOUNTER — Ambulatory Visit
Admission: RE | Admit: 2022-09-13 | Discharge: 2022-09-13 | Disposition: A | Discharge status: Home / Self Care | Payer: Medicare PPO | Source: Ambulatory Visit | Attending: Adult Health | Admitting: Adult Health

## 2022-09-13 DIAGNOSIS — Z1231 Encounter for screening mammogram for malignant neoplasm of breast: Secondary | ICD-10-CM

## 2022-10-13 ENCOUNTER — Ambulatory Visit (INDEPENDENT_AMBULATORY_CARE_PROVIDER_SITE_OTHER): Payer: Medicare PPO

## 2022-10-13 VITALS — Ht 63.5 in | Wt 160.0 lb

## 2022-10-13 DIAGNOSIS — Z0000 Encounter for general adult medical examination without abnormal findings: Secondary | ICD-10-CM | POA: Diagnosis not present

## 2022-10-13 NOTE — Progress Notes (Signed)
Subjective:   Tamara Hughes is a 71 y.o. femGrover Hughes who presents for Medicare Annual (Subsequent) preventive examination.  Review of Systems    Virtual Visit via Telephone Note  I connected with  Tamara Hughes on 10/13/22 at 11:30 AM EDT by telephone and verified that I am speaking with the correct person using two identifiers.  Location: Patient: Home Provider: Office Persons participating in the virtual visit: patient/Nurse Health Advisor   I discussed the limitations, risks, security and privacy concerns of performing an evaluation and management service by telephone and the availability of in person appointments. The patient expressed understanding and agreed to proceed.  Interactive audio and video telecommunications were attempted between this nurse and patient, however failed, due to patient having technical difficulties OR patient did not have access to video capability.  We continued and completed visit with audio only.  Some vital signs may be absent or patient reported.   Tamara RungBeverly W Eleanna Theilen, LPN  Cardiac Risk Factors include: advanced age (>[id number], 72>65 women)     Objective:    Today's Vitals   10/13/22 1139  Weight: 160 lb (72.6 kg)  Height: 5' 3.5" (1.613 m)   Body mass index is 27.9 kg/m.     10/13/2022   11:46 AM 10/12/2021    1:57 PM 09/22/2020    9:04 AM 09/04/2020    3:45 PM 11/07/2019   10:21 AM 07/25/2019   10:27 AM  Advanced Directives  Does Patient Have a Medical Advance Directive? No No No No No No  Would patient like information on creating a medical advance directive? No - Patient declined No - Patient declined No - Patient declined No - Patient declined No - Patient declined No - Patient declined    Current Medications (verified) Outpatient Encounter Medications as of 10/13/2022  Medication Sig   benzonatate (TESSALON) 200 MG capsule Take 1 capsule (200 mg total) by mouth 2 (two) times daily as needed for cough.   budesonide-formoterol (SYMBICORT)  160-4.5 MCG/ACT inhaler Inhale 2 puffs into the lungs 2 (two) times daily.   chlorpheniramine-HYDROcodone (TUSSIONEX PENNKINETIC ER) 10-8 MG/5ML Take 5 mLs by mouth every 12 (twelve) hours as needed for cough.   citalopram (CELEXA) 40 MG tablet TAKE 1 TABLET(40 MG) BY MOUTH DAILY   cyclobenzaprine (FLEXERIL) 10 MG tablet Take 1 tablet (10 mg total) by mouth 3 (three) times daily as needed for muscle spasms.   fluconazole (DIFLUCAN) 150 MG tablet Take 1 tablet (150 mg total) by mouth daily.   fluticasone (FLONASE) 50 MCG/ACT nasal spray INHALE 2 SPRAYS INTO BOTH NOSTRILS ONCE DAILY   LORazepam (ATIVAN) 0.5 MG tablet take 1 tablet by mouth twice a day if needed   Multiple Vitamin (MULTIVITAMIN) tablet Take 1 tablet by mouth daily.   OVER THE COUNTER MEDICATION Place 2 drops into both eyes daily as needed (dry eyes).   predniSONE (DELTASONE) 10 MG tablet 40 mg x 3 days, 20 mg x 3 days, 10 mg x 3 days   pseudoephedrine (SUDAFED) 30 MG tablet Take 1 tablet (30 mg total) by mouth every 8 (eight) hours as needed for congestion.   RESTASIS 0.05 % ophthalmic emulsion INT 1 DROP AEY BID   simvastatin (ZOCOR) 40 MG tablet TAKE 1 TABLET(40 MG) BY MOUTH AT BEDTIME   vitamin C (ASCORBIC ACID) 250 MG tablet Take 500 mg by mouth daily.   No facility-administered encounter medications on file as of 10/13/2022.    Allergies (verified) Doxycycline, Ether, Indomethacin, and Penicillins  History: Past Medical History:  Diagnosis Date   Allergy    Anxiety    Arthritis    Depression    Family history of breast cancer    Hyperlipidemia    Past Surgical History:  Procedure Laterality Date   GUM SURGERY     HYSTEROSCOPY     KNEE SURGERY Right    Family History  Problem Relation Age of Onset   Breast cancer Mother 71   Bone cancer Father 36   Breast cancer Sister 68   Heart attack Sister    Diabetes Sister    Breast cancer Maternal Aunt        dx >50   Cancer Maternal Aunt        NOS   Breast  cancer Other        MGF's sister #1   Hyperlipidemia Other    Hypertension Other    Thyroid disease Other    Stomach cancer Neg Hx    Rectal cancer Neg Hx    Pancreatic cancer Neg Hx    Colon cancer Neg Hx    Social History   Socioeconomic History   Marital status: Single    Spouse name: Not on file   Number of children: 1   Years of education: Not on file   Highest education level: Not on file  Occupational History   Not on file  Tobacco Use   Smoking status: Former    Packs/day: 1.00    Years: 15.00    Total pack years: 15.00    Types: Cigarettes    Quit date: 04/21/1983    Years since quitting: 39.5   Smokeless tobacco: Never  Vaping Use   Vaping Use: Never used  Substance and Sexual Activity   Alcohol use: Yes    Comment: socially   Drug use: No   Sexual activity: Not on file  Other Topics Concern   Not on file  Social History Narrative   Not on file   Social Determinants of Health   Financial Resource Strain: Low Risk  (10/13/2022)   Overall Financial Resource Strain (CARDIA)    Difficulty of Paying Living Expenses: Not hard at all  Food Insecurity: No Food Insecurity (10/13/2022)   Hunger Vital Sign    Worried About Running Out of Food in the Last Year: Never true    Ran Out of Food in the Last Year: Never true  Transportation Needs: No Transportation Needs (10/13/2022)   PRAPARE - Hydrologist (Medical): No    Lack of Transportation (Non-Medical): No  Physical Activity: Insufficiently Active (10/13/2022)   Exercise Vital Sign    Days of Exercise per Week: 3 days    Minutes of Exercise per Session: 40 min  Stress: No Stress Concern Present (10/13/2022)   [place]    Feeling of Stress : Not at all  Social Connections: Moderately Integrated (10/13/2022)   Social Connection and Isolation Panel [NHANES]    Frequency of Communication with Friends and Family:  More than three times a week    Frequency of Social Gatherings with Friends and Family: More than three times a week    Attends Religious Services: More than 4 times per year    Active Member of [employer] or Organizations: Yes    Attends Archivist Meetings: More than 4 times per year    Marital Status: Never married    Tobacco Counseling Counseling given:  Not Answered   Clinical Intake:  Pre-visit preparation completed: No  Pain : No/denies pain     BMI - recorded: 27.9 Nutritional Status: BMI 25 -29 Overweight Nutritional Risks: None Diabetes: No  How often do you need to have someone help you when you read instructions, pamphlets, or other written materials from your doctor or pharmacy?: 1 - Never  Diabetic?  No  Interpreter Needed?: No  Information entered by :: Theresa Mulligan LPN   Activities of Daily Living    10/13/2022   11:44 AM  In your present state of health, do you have any difficulty performing the following activities:  Hearing? 1  Comment Wears hearing aids  Vision? 0  Difficulty concentrating or making decisions? 0  Walking or climbing stairs? 0  Dressing or bathing? 0  Doing errands, shopping? 0  Preparing Food and eating ? N  Using the Toilet? N  In the past six months, have you accidently leaked urine? N  Do you have problems with loss of bowel control? N  Managing your Medications? N  Managing your Finances? N  Housekeeping or managing your Housekeeping? N    Patient Care Team: Shirline Frees, NP as PCP - General (Family Medicine)  Indicate any recent Medical Services you may have received from other than [facility] providers in the past year (date may be approximate).     Assessment:   This is a routine wellness examination for Tamara Hughes.  Hearing/Vision screen Hearing Screening - Comments:: Wears hearing aids Vision Screening - Comments:: Wears rx glasses - up to date with routine eye exams with  Dr Emily Filbert  Dietary issues and  exercise activities discussed: Current Exercise Habits: Home exercise routine, Type of exercise: walking, Time (Minutes): 40, Frequency (Times/Week): 3, Weekly Exercise (Minutes/Week): 120, Intensity: Moderate, Exercise limited by: None identified   Goals Addressed               This Visit's Progress     Patient stated (pt-stated)        Eat healthy       Depression Screen    10/13/2022   11:42 AM 06/21/2022   10:50 AM 06/13/2022    2:56 PM 11/01/2021   10:50 AM 10/12/2021    1:58 PM 10/12/2021    1:55 PM 09/22/2020    9:06 AM  PHQ 2/9 Scores  PHQ - 2 Score 0 0 0 0 0 0 0  PHQ- 9 Score  3 0    0    Fall Risk    10/13/2022   11:45 AM 10/12/2021    1:58 PM 09/22/2020    9:05 AM 09/24/2019   10:32 AM 09/24/2017   10:12 AM  Fall Risk   Falls in the past year? 1 0 1 0 No  Number falls in past yr: 0 0 0    Injury with Fall? 0 0 0    Risk for fall due to : No Fall Risks  Orthopedic patient    Follow up  Falls evaluation completed Falls evaluation completed;Falls prevention discussed      FALL RISK PREVENTION PERTAINING TO THE HOME:  Any stairs in or around the home? Yes  If so, are there any without handrails? No  Home free of loose throw rugs in walkways, pet beds, electrical cords, etc? Yes  Adequate lighting in your home to reduce risk of falls? Yes   ASSISTIVE DEVICES UTILIZED TO PREVENT FALLS:  Life alert? No  Use of a cane, walker or w/c?  No  Grab bars in the bathroom? Yes  Shower chair or bench in shower? Yes  Elevated toilet seat or a handicapped toilet? Yes   TIMED UP AND GO:  Was the test performed? No . Audio Visit   Cognitive Function:        10/13/2022   11:46 AM  6CIT Screen  What Year? 0 points  What month? 0 points  What time? 0 points  Count back from 20 0 points  Months in reverse 0 points  Repeat phrase 0 points  Total Score 0 points    Immunizations Immunization History  Administered Date(s) Administered   Fluad Quad(high Dose  65+) 09/24/2019, 10/20/2020, 11/01/2021   Influenza Split 09/19/2012   Influenza, High Dose Seasonal PF 09/28/2015, 09/13/2016, 09/24/2017   Influenza,inj,Quad PF,6+ Mos 09/24/2014, 09/16/2018   Moderna Sars-Covid-2 Vaccination 02/16/2020, 03/23/2020, 03/27/2021   Pneumococcal Conjugate-13 09/24/2017   Pneumococcal Polysaccharide-23 09/24/2019   Td 12/25/2004, 09/28/2015    TDAP status: Up to date  Flu Vaccine status: Up to date  Pneumococcal vaccine status: Up to date  Covid-19 vaccine status: Completed vaccines  Qualifies for Shingles Vaccine? Yes   Zostavax completed Yes   Shingrix Completed?: Yes  Screening Tests Health Maintenance  Topic Date Due   Zoster Vaccines- Shingrix (1 of 2) Never done   COVID-19 Vaccine (4 - Moderna series) 10/29/2022 (Originally 05/22/2021)   INFLUENZA VACCINE  03/25/2023 (Originally 07/25/2022)   MAMMOGRAM  09/14/2023   TETANUS/TDAP  09/27/2025   COLONOSCOPY (Pts 45-19yrs Insurance coverage will need to be confirmed)  12/28/2027   Pneumonia Vaccine 74+ Years old  Completed   DEXA SCAN  Completed   Hepatitis C Screening  Completed   HPV VACCINES  Aged Out    Health Maintenance  Health Maintenance Due  Topic Date Due   Zoster Vaccines- Shingrix (1 of 2) Never done    Colorectal cancer screening: Type of screening: Colonoscopy. Completed 12/27/17. Repeat every 10 years  Mammogram status: Completed 09/13/22. Repeat every year  Bone Density status: Completed 09/29/19. Results reflect: Bone density results: OSTEOPOROSIS. Repeat every   years.  Lung Cancer Screening: (Low Dose CT Chest recommended if Age 38-80 years, 30 pack-year currently smoking OR have quit w/in 15years.) does not qualify.     Additional Screening:  Hepatitis C Screening: does qualify; Completed 11/01/21  Vision Screening: Recommended annual ophthalmology exams for early detection of glaucoma and other disorders of the eye. Is the patient up to date with their annual eye  exam?  Yes  Who is the provider or what is the name of the office in which the patient attends annual eye exams? Dr Emily Filbert If pt is not established with a provider, would they like to be referred to a provider to establish care? No .   Dental Screening: Recommended annual dental exams for proper oral hygiene  Community Resource Referral / Chronic Care Management:  CRR required this visit?  No   CCM required this visit?  No      Plan:     I have personally reviewed and noted the following in the patient's chart:   Medical and social history Use of alcohol, tobacco or illicit drugs  Current medications and supplements including opioid prescriptions. Patient is not currently taking opioid prescriptions. Functional ability and status Nutritional status Physical activity Advanced directives List of other physicians Hospitalizations, surgeries, and ER visits in previous 12 months Vitals Screenings to include cognitive, depression, and falls Referrals and appointments  In addition, I  have reviewed and discussed with patient certain preventive protocols, quality metrics, and best practice recommendations. A written personalized care plan for preventive services as well as general preventive health recommendations were provided to patient.     Tamara Rung, LPN   16/09/[id number]   Nurse Notes: None

## 2022-10-13 NOTE — Patient Instructions (Addendum)
[name] , Thank you for taking time to come for your Medicare Wellness Visit. I appreciate your ongoing commitment to your health goals. Please review the following plan we discussed and let me know if I can assist you in the future.   These are the goals we discussed:  Goals       Patient stated (pt-stated)      Eat healthy        This is a list of the screening recommended for you and due dates:  Health Maintenance  Topic Date Due   Zoster (Shingles) Vaccine (1 of 2) Never done   COVID-19 Vaccine (4 - Moderna series) 10/29/2022*   Flu Shot  03/25/2023*   Mammogram  09/14/2023   Tetanus Vaccine  09/27/2025   Colon Cancer Screening  12/28/2027   Pneumonia Vaccine  Completed   DEXA scan (bone density measurement)  Completed   Hepatitis C Screening: USPSTF Recommendation to screen - Ages 61-79 yo.  Completed   HPV Vaccine  Aged Out  *Topic was postponed. The date shown is not the original due date.    Advanced directives: Advance directive discussed with you today. Even though you declined this today, please call our office should you change your mind, and we can give you the proper paperwork for you to fill out.   Conditions/risks identified: None  Next appointment: Follow up in one year for your annual wellness visit     Preventive Care 65 Years and Older, Female Preventive care refers to lifestyle choices and visits with your health care provider that can promote health and wellness. What does preventive care include? A yearly physical exam. This is also called an annual well check. Dental exams once or twice a year. Routine eye exams. Ask your health care provider how often you should have your eyes checked. Personal lifestyle choices, including: Daily care of your teeth and gums. Regular physical activity. Eating a healthy diet. Avoiding tobacco and drug use. Limiting alcohol use. Practicing safe sex. Taking low-dose aspirin every day. Taking vitamin and mineral  supplements as recommended by your health care provider. What happens during an annual well check? The services and screenings done by your health care provider during your annual well check will depend on your age, overall health, lifestyle risk factors, and family history of disease. Counseling  Your health care provider may ask you questions about your: Alcohol use. Tobacco use. Drug use. Emotional well-being. Home and relationship well-being. Sexual activity. Eating habits. History of falls. Memory and ability to understand (cognition). Work and work Statistician. Reproductive health. Screening  You may have the following tests or measurements: Height, weight, and BMI. Blood pressure. Lipid and cholesterol levels. These may be checked every 5 years, or more frequently if you are over 51 years old. Skin check. Lung cancer screening. You may have this screening every year starting at age 57 if you have a 30-pack-year history of smoking and currently smoke or have quit within the past 15 years. Fecal occult blood test (FOBT) of the stool. You may have this test every year starting at age 31. Flexible sigmoidoscopy or colonoscopy. You may have a sigmoidoscopy every 5 years or a colonoscopy every 10 years starting at age 6. Hepatitis C blood test. Hepatitis B blood test. Sexually transmitted disease (STD) testing. Diabetes screening. This is done by checking your blood sugar (glucose) after you have not eaten for a while (fasting). You may have this done every 1-3 years. Bone density scan.  This is done to screen for osteoporosis. You may have this done starting at age 20. Mammogram. This may be done every 1-2 years. Talk to your health care provider about how often you should have regular mammograms. Talk with your health care provider about your test results, treatment options, and if necessary, the need for more tests. Vaccines  Your health care provider may recommend certain  vaccines, such as: Influenza vaccine. This is recommended every year. Tetanus, diphtheria, and acellular pertussis (Tdap, Td) vaccine. You may need a Td booster every 10 years. Zoster vaccine. You may need this after age 27. Pneumococcal 13-valent conjugate (PCV13) vaccine. One dose is recommended after age 50. Pneumococcal polysaccharide (PPSV23) vaccine. One dose is recommended after age 54. Talk to your health care provider about which screenings and vaccines you need and how often you need them. This information is not intended to replace advice given to you by your health care provider. Make sure you discuss any questions you have with your health care provider. Document Released: 01/07/2016 Document Revised: 08/30/2016 Document Reviewed: 10/12/2015 Elsevier Interactive Patient Education  2017 [place] Prevention in the Home Falls can cause injuries. They can happen to people of all ages. There are many things you can do to make your home safe and to help prevent falls. What can I do on the outside of my home? Regularly fix the edges of walkways and driveways and fix any cracks. Remove anything that might make you trip as you walk through a door, such as a raised step or threshold. Trim any bushes or trees on the path to your home. Use bright outdoor lighting. Clear any walking paths of anything that might make someone trip, such as rocks or tools. Regularly check to see if handrails are loose or broken. Make sure that both sides of any steps have handrails. Any raised decks and porches should have guardrails on the edges. Have any leaves, snow, or ice cleared regularly. Use sand or salt on walking paths during winter. Clean up any spills in your garage right away. This includes oil or grease spills. What can I do in the bathroom? Use night lights. Install grab bars by the toilet and in the tub and shower. Do not use towel bars as grab bars. Use non-skid mats or decals in  the tub or shower. If you need to sit down in the shower, use a plastic, non-slip stool. Keep the floor dry. Clean up any water that spills on the floor as soon as it happens. Remove soap buildup in the tub or shower regularly. Attach bath mats securely with double-sided non-slip rug tape. Do not have throw rugs and other things on the floor that can make you trip. What can I do in the bedroom? Use night lights. Make sure that you have a light by your bed that is easy to reach. Do not use any sheets or blankets that are too big for your bed. They should not hang down onto the floor. Have a firm chair that has side arms. You can use this for support while you get dressed. Do not have throw rugs and other things on the floor that can make you trip. What can I do in the kitchen? Clean up any spills right away. Avoid walking on wet floors. Keep items that you use a lot in easy-to-reach places. If you need to reach something above you, use a strong step stool that has a grab bar. Keep electrical cords  out of the way. Do not use floor polish or wax that makes floors slippery. If you must use wax, use non-skid floor wax. Do not have throw rugs and other things on the floor that can make you trip. What can I do with my stairs? Do not leave any items on the stairs. Make sure that there are handrails on both sides of the stairs and use them. Fix handrails that are broken or loose. Make sure that handrails are as long as the stairways. Check any carpeting to make sure that it is firmly attached to the stairs. Fix any carpet that is loose or worn. Avoid having throw rugs at the top or bottom of the stairs. If you do have throw rugs, attach them to the floor with carpet tape. Make sure that you have a light switch at the top of the stairs and the bottom of the stairs. If you do not have them, ask someone to add them for you. What else can I do to help prevent falls? Wear shoes that: Do not have high  heels. Have rubber bottoms. Are comfortable and fit you well. Are closed at the toe. Do not wear sandals. If you use a stepladder: Make sure that it is fully opened. Do not climb a closed stepladder. Make sure that both sides of the stepladder are locked into place. Ask someone to hold it for you, if possible. Clearly mark and make sure that you can see: Any grab bars or handrails. First and last steps. Where the edge of each step is. Use tools that help you move around (mobility aids) if they are needed. These include: Canes. Walkers. Scooters. Crutches. Turn on the lights when you go into a dark area. Replace any light bulbs as soon as they burn out. Set up your furniture so you have a clear path. Avoid moving your furniture around. If any of your floors are uneven, fix them. If there are any pets around you, be aware of where they are. Review your medicines with your doctor. Some medicines can make you feel dizzy. This can increase your chance of falling. Ask your doctor what other things that you can do to help prevent falls. This information is not intended to replace advice given to you by your health care provider. Make sure you discuss any questions you have with your health care provider. Document Released: 10/07/2009 Document Revised: 05/18/2016 Document Reviewed: 01/15/2015 Elsevier Interactive Patient Education  2017 [employer].

## 2022-11-02 ENCOUNTER — Encounter: Payer: Self-pay | Admitting: Adult Health

## 2022-11-02 ENCOUNTER — Ambulatory Visit (INDEPENDENT_AMBULATORY_CARE_PROVIDER_SITE_OTHER): Payer: Medicare PPO | Admitting: Adult Health

## 2022-11-02 VITALS — BP 118/78 | HR 70 | Temp 98.0°F | Ht 63.25 in | Wt 162.0 lb

## 2022-11-02 DIAGNOSIS — F419 Anxiety disorder, unspecified: Secondary | ICD-10-CM

## 2022-11-02 DIAGNOSIS — E2839 Other primary ovarian failure: Secondary | ICD-10-CM | POA: Diagnosis not present

## 2022-11-02 DIAGNOSIS — F32A Depression, unspecified: Secondary | ICD-10-CM

## 2022-11-02 DIAGNOSIS — R252 Cramp and spasm: Secondary | ICD-10-CM

## 2022-11-02 DIAGNOSIS — Z23 Encounter for immunization: Secondary | ICD-10-CM

## 2022-11-02 DIAGNOSIS — E785 Hyperlipidemia, unspecified: Secondary | ICD-10-CM

## 2022-11-02 DIAGNOSIS — Z0000 Encounter for general adult medical examination without abnormal findings: Secondary | ICD-10-CM | POA: Diagnosis not present

## 2022-11-02 LAB — CBC WITH DIFFERENTIAL/PLATELET
Basophils Absolute: 0.1 10*3/uL (ref 0.0–0.1)
Basophils Relative: 1.2 % (ref 0.0–3.0)
Eosinophils Absolute: 0.3 10*3/uL (ref 0.0–0.7)
Eosinophils Relative: 4.3 % (ref 0.0–5.0)
HCT: 40.5 % (ref 36.0–46.0)
Hemoglobin: 12.8 g/dL (ref 12.0–15.0)
Lymphocytes Relative: 20.6 % (ref 12.0–46.0)
Lymphs Abs: 1.4 10*3/uL (ref 0.7–4.0)
MCHC: 31.6 g/dL (ref 30.0–36.0)
MCV: 82.1 fl (ref 78.0–100.0)
Monocytes Absolute: 0.7 10*3/uL (ref 0.1–1.0)
Monocytes Relative: 10.2 % (ref 3.0–12.0)
Neutro Abs: 4.2 10*3/uL (ref 1.4–7.7)
Neutrophils Relative %: 63.7 % (ref 43.0–77.0)
Platelets: 264 10*3/uL (ref 150.0–400.0)
RBC: 4.93 Mil/uL (ref 3.87–5.11)
RDW: 13.9 % (ref 11.5–15.5)
WBC: 6.6 10*3/uL (ref 4.0–10.5)

## 2022-11-02 LAB — COMPREHENSIVE METABOLIC PANEL
ALT: 12 U/L (ref 0–35)
AST: 19 U/L (ref 0–37)
Albumin: 4.2 g/dL (ref 3.5–5.2)
Alkaline Phosphatase: 66 U/L (ref 39–117)
BUN: 11 mg/dL (ref 6–23)
CO2: 31 mEq/L (ref 19–32)
Calcium: 9.1 mg/dL (ref 8.4–10.5)
Chloride: 105 mEq/L (ref 96–112)
Creatinine, Ser: 0.61 mg/dL (ref 0.40–1.20)
GFR: 88.87 mL/min (ref >60.00)
Glucose, Bld: 83 mg/dL (ref 70–99)
Potassium: 4.4 mEq/L (ref 3.5–5.1)
Sodium: 141 mEq/L (ref 135–145)
Total Bilirubin: 0.6 mg/dL (ref 0.2–1.2)
Total Protein: 7 g/dL (ref 6.0–8.3)

## 2022-11-02 LAB — LIPID PANEL
Cholesterol: 167 mg/dL (ref 0–200)
HDL: 60.3 mg/dL (ref >39.00)
LDL Cholesterol: 87 mg/dL (ref 0–99)
NonHDL: 106.8
Total CHOL/HDL Ratio: 3
Triglycerides: 101 mg/dL (ref 0.0–149.0)
VLDL: 20.2 mg/dL (ref 0.0–40.0)

## 2022-11-02 LAB — TSH: TSH: 0.86 u[IU]/mL (ref 0.35–5.50)

## 2022-11-02 LAB — HEMOGLOBIN A1C: Hgb A1c MFr Bld: 5.9 % (ref 4.6–6.5)

## 2022-11-02 LAB — VITAMIN D 25 HYDROXY (VIT D DEFICIENCY, FRACTURES): VITD: 16.02 ng/mL — ABNORMAL LOW (ref 30.00–100.00)

## 2022-11-02 LAB — MAGNESIUM: Magnesium: 1.9 mg/dL (ref 1.5–2.5)

## 2022-11-02 MED ORDER — LORAZEPAM 0.5 MG PO TABS: ORAL_TABLET | ORAL | 2 refills | Status: DC

## 2022-11-02 NOTE — Progress Notes (Signed)
Subjective:    Patient ID: Tamara Hughes, female    DOB: 12-Dec-1951, 71 y.o.   MRN: [id number]  HPI Patient presents for yearly preventative medicine examination. She is a pleasant 71 year old female who  has a past medical history of Allergy, Anxiety, Arthritis, Depression, Family history of breast cancer, and Hyperlipidemia.  Anxiety/depression-currently managed with Celexa 40 mg nightly and Ativan 0.5 mg twice daily as needed.  She does feel well controlled on this regimen  Hyperlipidemia-takes Zocor 40 mg nightly.  She denies myalgia or fatigue  Lab Results  Component Value Date   CHOL 157 11/01/2021   HDL 54.60 11/01/2021   LDLCALC 78 11/01/2021   TRIG 119.0 11/01/2021   CHOLHDL 3 11/01/2021   Muscle Cramps -reports that for the last 3 weeks, since she had the COVID 19 vaccination she has been experiencing periodic muscle contractions in her calfs.  These muscle cramps can last upwards of 4 days.  Thankfully she has not had 1 in about a week.  She is trying to stay hydrated.  Has not been exercising much since she had the COVID-19 vaccination.  All immunizations and health maintenance protocols were reviewed with the patient and needed orders were placed.  Appropriate screening laboratory values were ordered for the patient including screening of hyperlipidemia, renal function and hepatic function.   Medication reconciliation,  past medical history, social history, problem list and allergies were reviewed in detail with the patient  Goals were established with regard to weight loss, exercise, and  diet in compliance with medications Wt Readings from Last 3 Encounters:  11/02/22 162 lb (73.5 kg)  10/13/22 160 lb (72.6 kg)  06/21/22 160 lb 4.8 oz (72.7 kg)    She is up to date on routine colon cancer screening   Review of Systems  Constitutional: Negative.   HENT:  Positive for hearing loss (chronic).   Eyes: Negative.   Respiratory: Negative.    Cardiovascular:  Negative.   Gastrointestinal: Negative.   Endocrine: Negative.   Genitourinary: Negative.   Musculoskeletal:  Positive for arthralgias and myalgias.  Allergic/Immunologic: Negative.   Neurological: Negative.   Hematological: Negative.   Psychiatric/Behavioral: Negative.     Past Medical History:  Diagnosis Date   Allergy    Anxiety    Arthritis    Depression    Family history of breast cancer    Hyperlipidemia     Social History   Socioeconomic History   Marital status: Single    Spouse name: Not on file   Number of children: 1   Years of education: Not on file   Highest education level: Not on file  Occupational History   Not on file  Tobacco Use   Smoking status: Former    Packs/day: 1.00    Years: 15.00    Total pack years: 15.00    Types: Cigarettes    Quit date: 04/21/1983    Years since quitting: 39.5   Smokeless tobacco: Never  Vaping Use   Vaping Use: Never used  Substance and Sexual Activity   Alcohol use: Yes    Comment: socially   Drug use: No   Sexual activity: Not on file  Other Topics Concern   Not on file  Social History Narrative   Not on file   Social Determinants of Health   Financial Resource Strain: Low Risk  (10/13/2022)   Overall Financial Resource Strain (CARDIA)    Difficulty of Paying Living Expenses: Not hard at  all  Food Insecurity: No Food Insecurity (10/13/2022)   Hunger Vital Sign    Worried About Running Out of Food in the Last Year: Never true    Ran Out of Food in the Last Year: Never true  Transportation Needs: No Transportation Needs (10/13/2022)   PRAPARE - Administrator, Civil Service (Medical): No    Lack of Transportation (Non-Medical): No  Physical Activity: Insufficiently Active (10/13/2022)   Exercise Vital Sign    Days of Exercise per Week: 3 days    Minutes of Exercise per Session: 40 min  Stress: No Stress Concern Present (10/13/2022)   [employer] of Occupational Health - Occupational  Stress Questionnaire    Feeling of Stress : Not at all  Social Connections: Moderately Integrated (10/13/2022)   Social Connection and Isolation Panel [NHANES]    Frequency of Communication with Friends and Family: More than three times a week    Frequency of Social Gatherings with Friends and Family: More than three times a week    Attends Religious Services: More than 4 times per year    Active Member of [employer] or Organizations: Yes    Attends Engineer, structural: More than 4 times per year    Marital Status: Never married  Intimate Partner Violence: Not At Risk (10/13/2022)   Humiliation, Afraid, Rape, and Kick questionnaire    Fear of Current or Ex-Partner: No    Emotionally Abused: No    Physically Abused: No    Sexually Abused: No    Past Surgical History:  Procedure Laterality Date   GUM SURGERY     HYSTEROSCOPY     KNEE SURGERY Right     Family History  Problem Relation Age of Onset   Breast cancer Mother 1   Bone cancer Father 37   Breast cancer Sister 19   Heart attack Sister    Diabetes Sister    Breast cancer Maternal Aunt        dx >50   Cancer Maternal Aunt        NOS   Breast cancer Other        MGF's sister #1   Hyperlipidemia Other    Hypertension Other    Thyroid disease Other    Stomach cancer Neg Hx    Rectal cancer Neg Hx    Pancreatic cancer Neg Hx    Colon cancer Neg Hx     Allergies  Allergen Reactions   Doxycycline     REACTION: rash   Ether    Indomethacin     REACTION: upset stomach   Penicillins     REACTION: Hives    Current Outpatient Medications on File Prior to Visit  Medication Sig Dispense Refill   budesonide-formoterol (SYMBICORT) 160-4.5 MCG/ACT inhaler Inhale 2 puffs into the lungs 2 (two) times daily. 1 each 0   citalopram (CELEXA) 40 MG tablet TAKE 1 TABLET(40 MG) BY MOUTH DAILY 90 tablet 1   cyclobenzaprine (FLEXERIL) 10 MG tablet Take 1 tablet (10 mg total) by mouth 3 (three) times daily as needed for  muscle spasms. 30 tablet 0   fluticasone (FLONASE) 50 MCG/ACT nasal spray INHALE 2 SPRAYS INTO BOTH NOSTRILS ONCE DAILY 16 g 6   LORazepam (ATIVAN) 0.5 MG tablet take 1 tablet by mouth twice a day if needed 30 tablet 2   Multiple Vitamin (MULTIVITAMIN) tablet Take 1 tablet by mouth daily.     OVER THE COUNTER MEDICATION Place 2 drops into both  eyes daily as needed (dry eyes).     pseudoephedrine (SUDAFED) 30 MG tablet Take 1 tablet (30 mg total) by mouth every 8 (eight) hours as needed for congestion. 30 tablet 0   RESTASIS 0.05 % ophthalmic emulsion INT 1 DROP AEY BID  3   simvastatin (ZOCOR) 40 MG tablet TAKE 1 TABLET(40 MG) BY MOUTH AT BEDTIME 90 tablet 3   vitamin C (ASCORBIC ACID) 250 MG tablet Take 500 mg by mouth daily.     No current facility-administered medications on file prior to visit.    BP 118/78   Pulse 70   Temp 98 F (36.7 C) (Oral)   Ht 5' 3.25" (1.607 m)   Wt 162 lb (73.5 kg)   SpO2 99%   BMI 28.47 kg/m       Objective:   Physical Exam Vitals and nursing note reviewed.  Constitutional:      General: She is not in acute distress.    Appearance: Normal appearance. She is well-developed. She is not ill-appearing.  HENT:     Head: Normocephalic and atraumatic.     Right Ear: Tympanic membrane, ear canal and external ear normal. There is no impacted cerumen.     Left Ear: Tympanic membrane, ear canal and external ear normal. There is no impacted cerumen.     Nose: Nose normal. No congestion or rhinorrhea.     Mouth/Throat:     Mouth: Mucous membranes are moist.     Pharynx: Oropharynx is clear. No oropharyngeal exudate or posterior oropharyngeal erythema.  Eyes:     General:        Right eye: No discharge.        Left eye: No discharge.     Extraocular Movements: Extraocular movements intact.     Conjunctiva/sclera: Conjunctivae normal.     Pupils: Pupils are equal, round, and reactive to light.  Neck:     Thyroid: No thyromegaly.     Vascular: No  carotid bruit.     Trachea: No tracheal deviation.  Cardiovascular:     Rate and Rhythm: Normal rate and regular rhythm.     Pulses: Normal pulses.     Heart sounds: Normal heart sounds. No murmur heard.    No friction rub. No gallop.  Pulmonary:     Effort: Pulmonary effort is normal. No respiratory distress.     Breath sounds: Normal breath sounds. No stridor. No wheezing, rhonchi or rales.  Chest:     Chest wall: No tenderness.  Abdominal:     General: Abdomen is flat. Bowel sounds are normal. There is no distension.     Palpations: Abdomen is soft. There is no mass.     Tenderness: There is no abdominal tenderness. There is no right CVA tenderness, left CVA tenderness, guarding or rebound.     Hernia: No hernia is present.  Musculoskeletal:        General: No swelling, tenderness, deformity or signs of injury. Normal range of motion.     Cervical back: Normal range of motion and neck supple.     Right lower leg: No edema.     Left lower leg: No edema.  Lymphadenopathy:     Cervical: No cervical adenopathy.  Skin:    General: Skin is warm and dry.     Coloration: Skin is not jaundiced or pale.     Findings: No bruising, erythema, lesion or rash.  Neurological:     General: No focal deficit present.  Mental Status: She is alert and oriented to person, place, and time.     Cranial Nerves: No cranial nerve deficit.     Sensory: No sensory deficit.     Motor: No weakness.     Coordination: Coordination normal.     Gait: Gait normal.     Deep Tendon Reflexes: Reflexes normal.  Psychiatric:        Mood and Affect: Mood normal.        Behavior: Behavior normal.        Thought Content: Thought content normal.        Judgment: Judgment normal.       Assessment & Plan:  1. Routine general medical examination at a health care facility - Follow up in one year or sooner if needed - CBC with Differential/Platelet; Future - Comprehensive metabolic panel; Future - Hemoglobin  A1c; Future - Lipid panel; Future - TSH; Future - VITAMIN D 25 Hydroxy (Vit-D Deficiency, Fractures); Future  2. Hyperlipidemia, unspecified hyperlipidemia type - Consider increase in statin  - CBC with Differential/Platelet; Future - Comprehensive metabolic panel; Future - Hemoglobin A1c; Future - Lipid panel; Future - TSH; Future - VITAMIN D 25 Hydroxy (Vit-D Deficiency, Fractures); Future  3. Anxiety and depression - Continue with Celexa and Ativan   4. Muscle cramps - encouraged stretching and hydration.  - CBC with Differential/Platelet; Future - Comprehensive metabolic panel; Future - Hemoglobin A1c; Future - Lipid panel; Future - TSH; Future - VITAMIN D 25 Hydroxy (Vit-D Deficiency, Fractures); Future - Magnesium; Future  5. Estrogen deficiency  - DG Bone Density; Future  Shirline Frees, NP

## 2022-11-02 NOTE — Patient Instructions (Signed)
It was great seeing you today   We will follow up with you regarding your lab work   Please let me know if you need anything   Schedule your bone density test at check out desk. You may also call directly to X-ray at [phone] to schedule an appointment that is convenient for you.  - located [address] across the [address] from [place] - in the basement - you do need an appointment for the bone density tests.  

## 2022-11-03 ENCOUNTER — Other Ambulatory Visit: Payer: Self-pay

## 2022-11-03 MED ORDER — VITAMIN D (ERGOCALCIFEROL) 1.25 MG (50000 UNIT) PO CAPS: 50000.0000 [IU] | ORAL_CAPSULE | ORAL | 0 refills | Status: DC

## 2022-11-08 ENCOUNTER — Ambulatory Visit (INDEPENDENT_AMBULATORY_CARE_PROVIDER_SITE_OTHER)
Admission: RE | Admit: 2022-11-08 | Discharge: 2022-11-08 | Disposition: A | Discharge status: Home / Self Care | Payer: Medicare PPO | Source: Ambulatory Visit | Attending: Adult Health | Admitting: Adult Health

## 2022-11-08 DIAGNOSIS — E2839 Other primary ovarian failure: Secondary | ICD-10-CM

## 2022-11-23 ENCOUNTER — Other Ambulatory Visit: Payer: Self-pay | Admitting: Adult Health

## 2022-11-23 DIAGNOSIS — E785 Hyperlipidemia, unspecified: Secondary | ICD-10-CM

## 2023-01-09 ENCOUNTER — Telehealth: Payer: Self-pay | Admitting: Adult Health

## 2023-01-09 NOTE — Telephone Encounter (Signed)
Pt called requesting to speak to CMA. Pt was asked if call was regarding a medication refill, as I could help with that.   Pt stated it was for something else and would rather speak directly to CMA. Please return Pt's call at your earliest convenience.   

## 2023-01-10 NOTE — Telephone Encounter (Signed)
Spoke to pt and she stated that the pharmacy only gave her a partial fill on the Vit D (12 tabs). Pt wanted to make sure that she is suppose to take the 25 instead of 12. I advised that the pharmacy could have ran out of Vit D to call them for the other half. Pt verbalized understanding.

## 2023-02-16 ENCOUNTER — Other Ambulatory Visit: Payer: Self-pay | Admitting: Adult Health

## 2023-02-16 DIAGNOSIS — F419 Anxiety disorder, unspecified: Secondary | ICD-10-CM

## 2023-03-13 ENCOUNTER — Encounter: Payer: Self-pay | Admitting: Adult Health

## 2023-03-14 ENCOUNTER — Other Ambulatory Visit: Payer: Self-pay | Admitting: Adult Health

## 2023-03-14 DIAGNOSIS — E559 Vitamin D deficiency, unspecified: Secondary | ICD-10-CM

## 2023-04-23 ENCOUNTER — Other Ambulatory Visit (INDEPENDENT_AMBULATORY_CARE_PROVIDER_SITE_OTHER): Payer: Medicare PPO

## 2023-04-23 DIAGNOSIS — E559 Vitamin D deficiency, unspecified: Secondary | ICD-10-CM

## 2023-04-24 ENCOUNTER — Other Ambulatory Visit: Payer: Self-pay | Admitting: Adult Health

## 2023-04-24 LAB — VITAMIN D 25 HYDROXY (VIT D DEFICIENCY, FRACTURES): VITD: 50.56 ng/mL (ref 30.00–100.00)

## 2023-05-22 ENCOUNTER — Ambulatory Visit: Payer: Medicare PPO | Admitting: Adult Health

## 2023-05-22 ENCOUNTER — Encounter: Payer: Self-pay | Admitting: Adult Health

## 2023-05-22 VITALS — BP 144/80 | HR 70 | Temp 98.0°F | Ht 63.25 in | Wt 162.8 lb

## 2023-05-22 DIAGNOSIS — M5442 Lumbago with sciatica, left side: Secondary | ICD-10-CM

## 2023-05-22 MED ORDER — METHYLPREDNISOLONE 4 MG PO TBPK: ORAL_TABLET | ORAL | 0 refills | Status: DC

## 2023-05-22 MED ORDER — CYCLOBENZAPRINE HCL 10 MG PO TABS: 10.0000 mg | ORAL_TABLET | Freq: Every day | ORAL | 0 refills | Status: DC

## 2023-05-22 NOTE — Progress Notes (Addendum)
Subjective:    Patient ID: Tamara Hughes, female    DOB: 1951/10/18, 72 y.o.   MRN: [id number]  Leg Pain   Back Pain Associated symptoms include leg pain.    72 year old female who  has a past medical history of Allergy, Anxiety, Arthritis, Depression, Family history of breast cancer, and Hyperlipidemia.  She is being evaluated today for an acute issue. She reports that 4 days ago she developed left sided low back pain. A day or so after she started developing pain pain that radiated down the left leg. Pain is described as a sharp pain and is only present when she walks or changes position. No pain with sitting or laying in bed. No loss of bowel or bladder    Review of Systems  Musculoskeletal:  Positive for back pain.   See HPI   Past Medical History:  Diagnosis Date   Allergy    Anxiety    Arthritis    Depression    Family history of breast cancer    Hyperlipidemia     Social History   Socioeconomic History   Marital status: Single    Spouse name: Not on file   Number of children: 1   Years of education: Not on file   Highest education level: Bachelor's degree (e.g., BA, AB, BS)  Occupational History   Not on file  Tobacco Use   Smoking status: Former    Packs/day: 1.00    Years: 15.00    Additional pack years: 0.00    Total pack years: 15.00    Types: Cigarettes    Quit date: 04/21/1983    Years since quitting: 40.1   Smokeless tobacco: Never  Vaping Use   Vaping Use: Never used  Substance and Sexual Activity   Alcohol use: Yes    Comment: socially   Drug use: No   Sexual activity: Not on file  Other Topics Concern   Not on file  Social History Narrative   Not on file   Social Determinants of Health   Financial Resource Strain: Low Risk  (05/20/2023)   Overall Financial Resource Strain (CARDIA)    Difficulty of Paying Living Expenses: Not hard at all  Food Insecurity: No Food Insecurity (05/20/2023)   Hunger Vital Sign    Worried About Running Out  of Food in the Last Year: Never true    Ran Out of Food in the Last Year: Never true  Transportation Needs: No Transportation Needs (05/20/2023)   PRAPARE - Administrator, Civil Service (Medical): No    Lack of Transportation (Non-Medical): No  Physical Activity: Inactive (05/20/2023)   Exercise Vital Sign    Days of Exercise per Week: 0 days    Minutes of Exercise per Session: 40 min  Stress: No Stress Concern Present (05/20/2023)   [employer] of Occupational Health - Occupational Stress Questionnaire    Feeling of Stress : Only a little  Social Connections: Moderately Integrated (05/20/2023)   Social Connection and Isolation Panel [NHANES]    Frequency of Communication with Friends and Family: More than three times a week    Frequency of Social Gatherings with Friends and Family: Three times a week    Attends Religious Services: More than 4 times per year    Active Member of Clubs or Organizations: Yes    Attends Banker Meetings: More than 4 times per year    Marital Status: Never married  Intimate Partner Violence:  Not At Risk (10/13/2022)   Humiliation, Afraid, Rape, and Kick questionnaire    Fear of Current or Ex-Partner: No    Emotionally Abused: No    Physically Abused: No    Sexually Abused: No    Past Surgical History:  Procedure Laterality Date   GUM SURGERY     HYSTEROSCOPY     KNEE SURGERY Right     Family History  Problem Relation Age of Onset   Breast cancer Mother 84   Bone cancer Father 56   Breast cancer Sister 14   Heart attack Sister    Diabetes Sister    Breast cancer Maternal Aunt        dx >50   Cancer Maternal Aunt        NOS   Breast cancer Other        MGF's sister #1   Hyperlipidemia Other    Hypertension Other    Thyroid disease Other    Stomach cancer Neg Hx    Rectal cancer Neg Hx    Pancreatic cancer Neg Hx    Colon cancer Neg Hx     Allergies  Allergen Reactions   Doxycycline     REACTION: rash    Ether    Indomethacin     REACTION: upset stomach   Penicillins     REACTION: Hives    Current Outpatient Medications on File Prior to Visit  Medication Sig Dispense Refill   budesonide-formoterol (SYMBICORT) 160-4.5 MCG/ACT inhaler Inhale 2 puffs into the lungs 2 (two) times daily. 1 each 0   citalopram (CELEXA) 40 MG tablet TAKE 1 TABLET(40 MG) BY MOUTH DAILY 90 tablet 1   cyclobenzaprine (FLEXERIL) 10 MG tablet Take 1 tablet (10 mg total) by mouth 3 (three) times daily as needed for muscle spasms. 30 tablet 0   fluticasone (FLONASE) 50 MCG/ACT nasal spray INHALE 2 SPRAYS INTO BOTH NOSTRILS ONCE DAILY 16 g 6   LORazepam (ATIVAN) 0.5 MG tablet take 1 tablet by mouth twice a day if needed 30 tablet 2   Multiple Vitamin (MULTIVITAMIN) tablet Take 1 tablet by mouth daily.     OVER THE COUNTER MEDICATION Place 2 drops into both eyes daily as needed (dry eyes).     pseudoephedrine (SUDAFED) 30 MG tablet Take 1 tablet (30 mg total) by mouth every 8 (eight) hours as needed for congestion. 30 tablet 0   RESTASIS 0.05 % ophthalmic emulsion INT 1 DROP AEY BID  3   simvastatin (ZOCOR) 40 MG tablet TAKE 1 TABLET(40 MG) BY MOUTH AT BEDTIME 90 tablet 3   vitamin C (ASCORBIC ACID) 250 MG tablet Take 500 mg by mouth daily.     No current facility-administered medications on file prior to visit.    BP (!) 144/80 (BP Location: Left Arm, Patient Position: Sitting, Cuff Size: Normal)   Pulse 70   Temp 98 F (36.7 C) (Oral)   Ht 5' 3.25" (1.607 m)   Wt 162 lb 12.8 oz (73.8 kg)   SpO2 99%   BMI 28.61 kg/m       Objective:   Physical Exam Vitals and nursing note reviewed.  Constitutional:      Appearance: Normal appearance.  Cardiovascular:     Rate and Rhythm: Normal rate and regular rhythm.     Pulses: Normal pulses.     Heart sounds: Normal heart sounds.  Pulmonary:     Effort: Pulmonary effort is normal.     Breath sounds: Normal breath sounds.  Musculoskeletal:        General:  Tenderness present. Normal range of motion.  Skin:    General: Skin is warm and dry.  Neurological:     General: No focal deficit present.     Mental Status: She is alert and oriented to person, place, and time.  Psychiatric:        Mood and Affect: Mood normal.        Behavior: Behavior normal.        Thought Content: Thought content normal.        Judgment: Judgment normal.        Assessment & Plan:  1. Acute left-sided low back pain with left-sided sciatica - cyclobenzaprine (FLEXERIL) 10 MG tablet; Take 1 tablet (10 mg total) by mouth at bedtime.  Dispense: 30 tablet; Refill: 0 - methylPREDNISolone (MEDROL DOSEPAK) 4 MG TBPK tablet; Take as directed  Dispense: 21 tablet; Refill: 0 - Follow up if not resolved in the next week   Shirline Frees, NP

## 2023-06-21 DIAGNOSIS — H5203 Hypermetropia, bilateral: Secondary | ICD-10-CM | POA: Diagnosis not present

## 2023-06-21 DIAGNOSIS — H16223 Keratoconjunctivitis sicca, not specified as Sjogren's, bilateral: Secondary | ICD-10-CM | POA: Diagnosis not present

## 2023-07-15 IMAGING — MG MM DIGITAL SCREENING BILAT W/ TOMO AND CAD
8 series · 8 of 24 positions shown · non-contrast
Comparison: Previous exam(s).

CLINICAL DATA: Screening.

EXAM:
DIGITAL SCREENING BILATERAL MAMMOGRAM WITH TOMOSYNTHESIS AND CAD
TECHNIQUE: Bilateral screening digital craniocaudal and mediolateral oblique
mammograms were obtained. Bilateral screening digital breast
tomosynthesis was performed. The images were evaluated with
computer-aided detection.

[L MLO synth-2D]
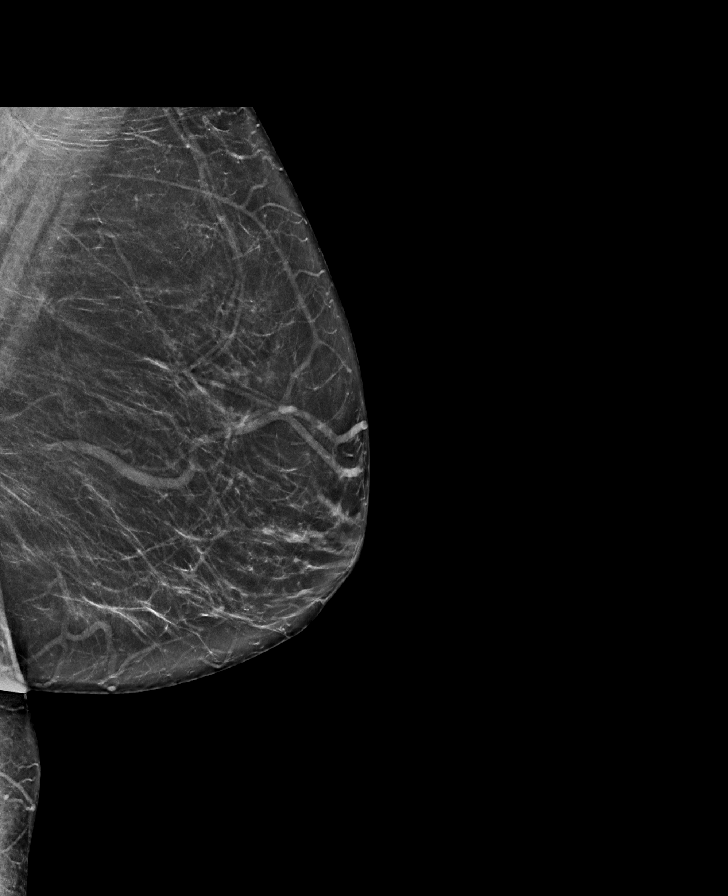

[R CC synth-2D]
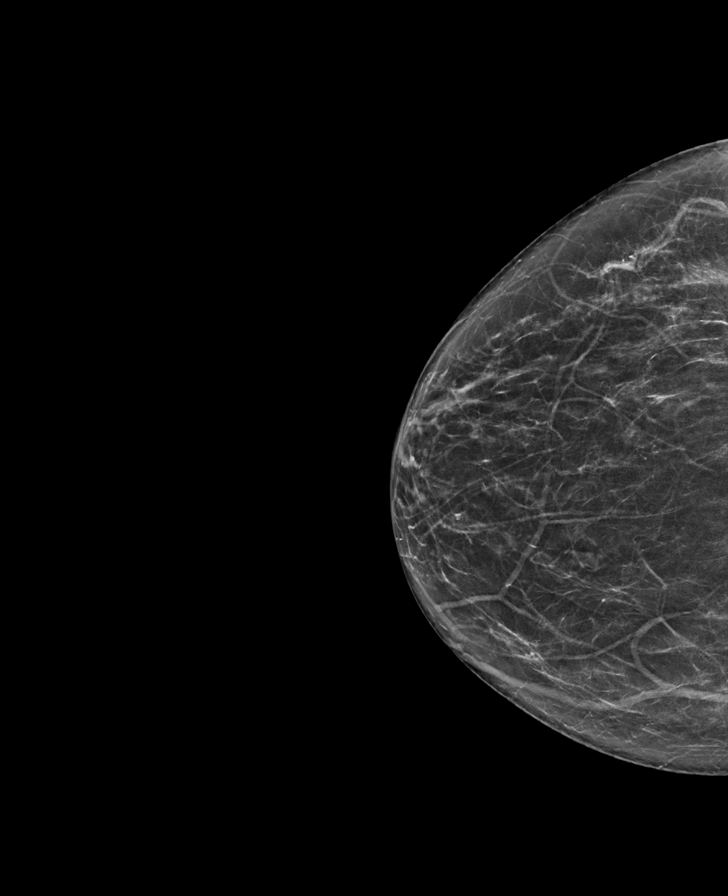

[L CC synth-2D]
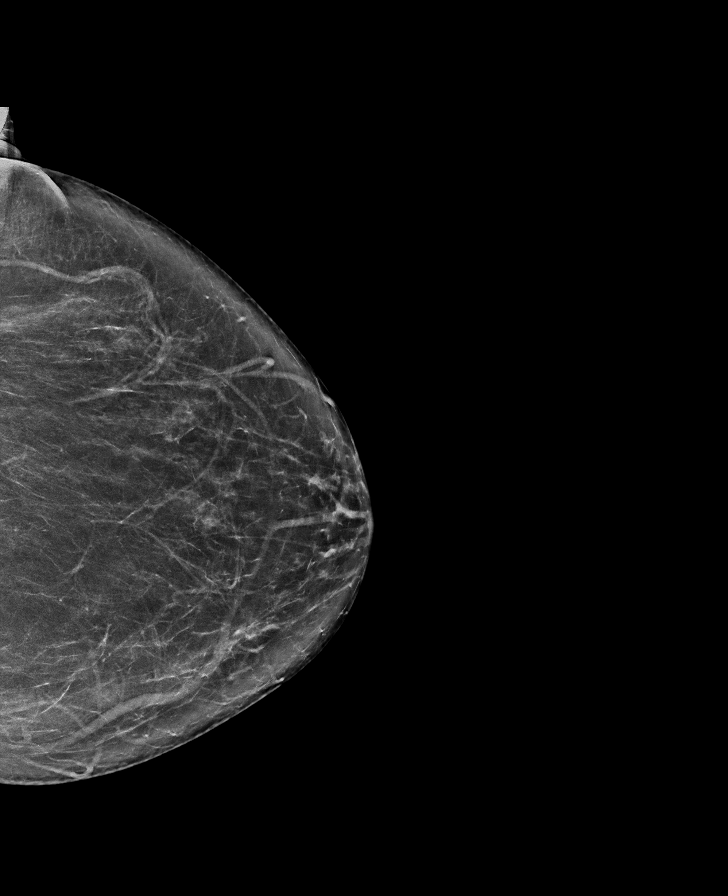

[R MLO synth-2D]
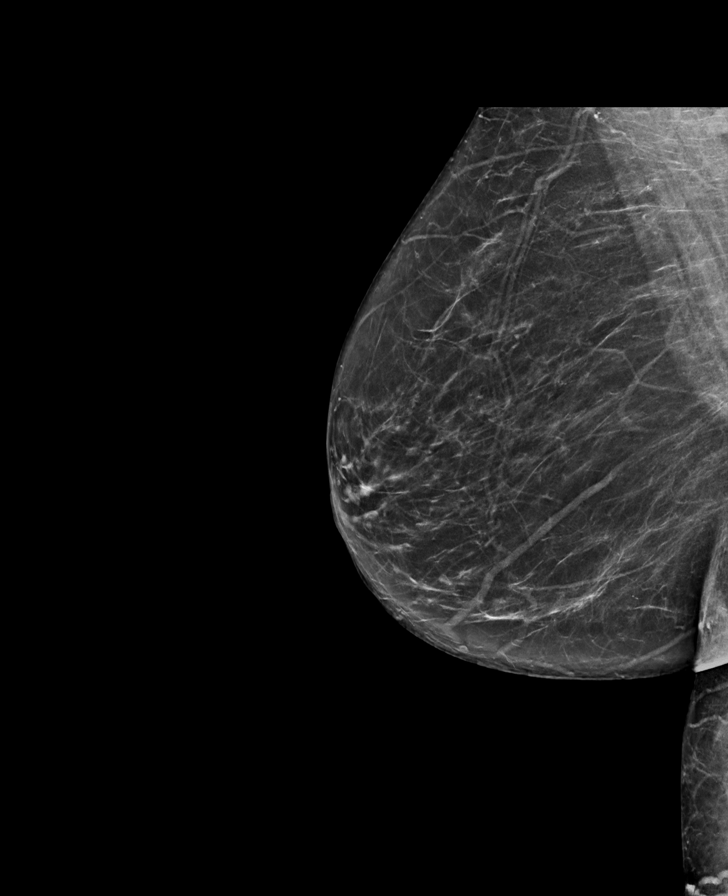

[R MLO tomo · tomo slice 37/72.0]
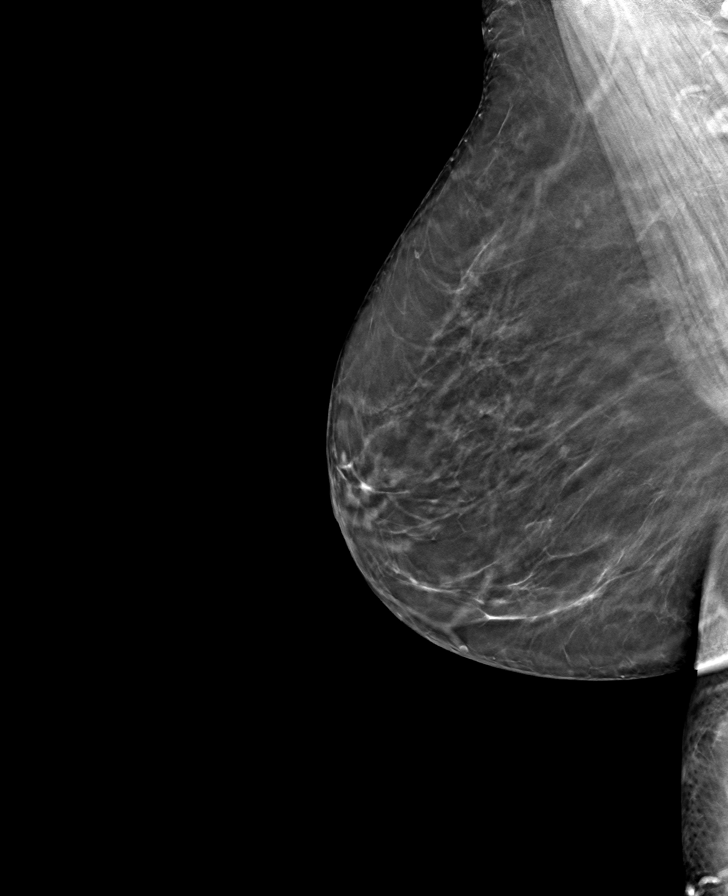

[L CC tomo · tomo slice 37/74.0]
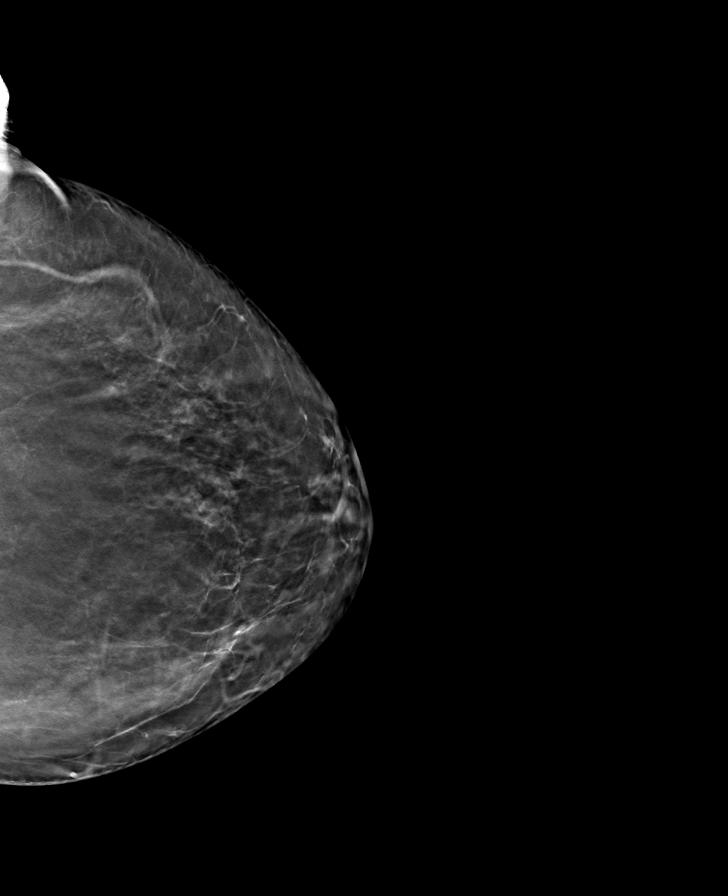

[R CC tomo · tomo slice 33/66.0]
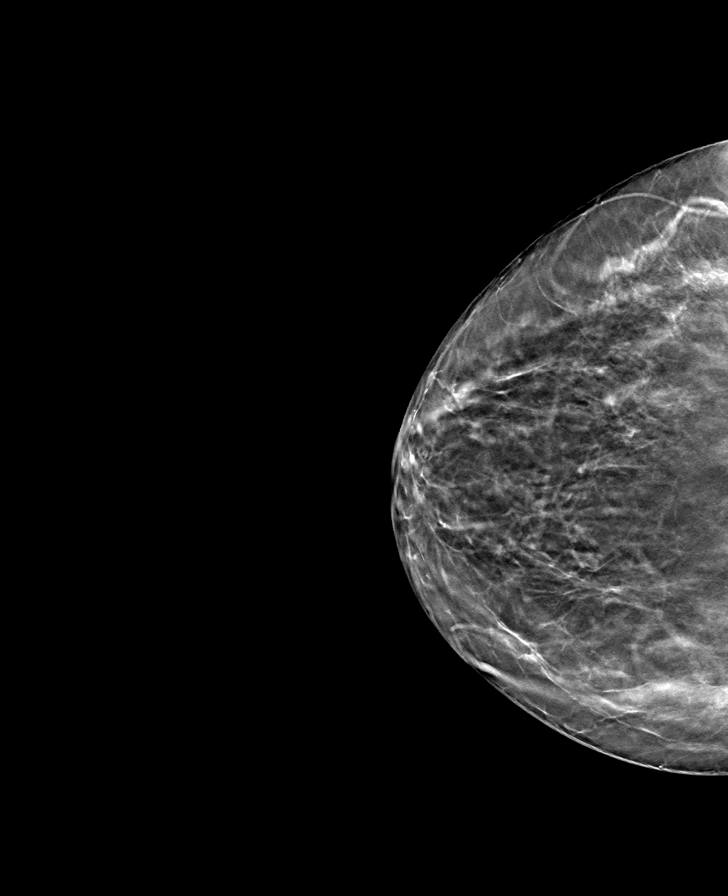

[L MLO tomo · tomo slice 37/73.0]
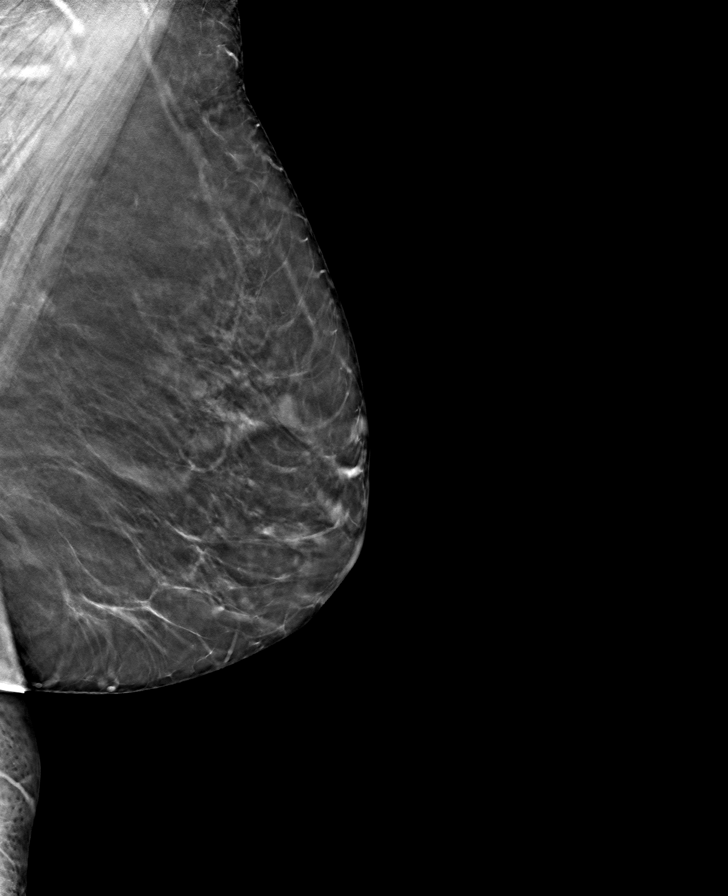

[8 of 24 positions shown; findings below may reference images not displayed]

ACR Breast Density Category b: There are scattered areas of
fibroglandular density.
FINDINGS: There are no findings suspicious for malignancy.
IMPRESSION: No mammographic evidence of malignancy. A result letter of this
screening mammogram will be mailed directly to the patient.

RECOMMENDATION:
Screening mammogram in one year. (Code:51-O-LD2)

BI-RADS CATEGORY  1: Negative.

## 2023-08-24 ENCOUNTER — Other Ambulatory Visit: Payer: Self-pay | Admitting: Adult Health

## 2023-08-24 DIAGNOSIS — Z1231 Encounter for screening mammogram for malignant neoplasm of breast: Secondary | ICD-10-CM

## 2023-08-30 ENCOUNTER — Ambulatory Visit: Payer: Medicare PPO | Admitting: Physician Assistant

## 2023-08-30 ENCOUNTER — Encounter: Payer: Self-pay | Admitting: Physician Assistant

## 2023-08-30 ENCOUNTER — Telehealth: Payer: Self-pay | Admitting: Adult Health

## 2023-08-30 ENCOUNTER — Other Ambulatory Visit (INDEPENDENT_AMBULATORY_CARE_PROVIDER_SITE_OTHER): Payer: Medicare PPO

## 2023-08-30 DIAGNOSIS — M25561 Pain in right knee: Secondary | ICD-10-CM

## 2023-08-30 DIAGNOSIS — M25562 Pain in left knee: Secondary | ICD-10-CM

## 2023-08-30 DIAGNOSIS — F32A Depression, unspecified: Secondary | ICD-10-CM

## 2023-08-30 MED ORDER — CITALOPRAM HYDROBROMIDE 40 MG PO TABS: ORAL_TABLET | ORAL | 0 refills | Status: DC

## 2023-08-30 MED ORDER — TRAMADOL HCL 50 MG PO TABS: 50.0000 mg | ORAL_TABLET | Freq: Four times a day (QID) | ORAL | 0 refills | Status: DC | PRN

## 2023-08-30 NOTE — Addendum Note (Signed)
Addended by: Elwin Mocha on: 08/30/2023 04:07 PM   Modules accepted: Orders

## 2023-08-30 NOTE — Telephone Encounter (Signed)
Pt requesting refill, says pharmacy will not fill her  citalopram (CELEXA) 40 MG tablet and that pcp needs to call in to authorize

## 2023-08-30 NOTE — Progress Notes (Signed)
Office Visit Note   Patient: Tamara Hughes           Date of Birth: April 04, 1951           MRN: [id number] Visit Date: 08/30/2023              Requested by: Shirline Frees, NP [address] [place],  [place] [address] PCP: Shirline Frees, NP   Assessment & Plan: Visit Diagnoses:  1. Acute bilateral knee pain     Plan: [name] is a pleasant 72 year old woman who is a former patient of Dr. Cleophas Dunker.  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  She has had an arthroscopy on the right knee.  She last saw Dr. Cleophas Dunker about 4 years ago.  He she has had injections done by him and then subsequently by a pain clinic without much relief in her symptoms.  She thinks she is having back pain on and off because of the pain in her knees.  She is now getting pain that awakens her at night.  She cannot walk any long distances.  She is not a diabetic and otherwise healthy.  We discussed her options.  She is not really interested in any more injections.  She had discussed knee replacement surgery with Dr. Cleophas Dunker in the past but he told her to try to wait as long as she can.  She has had members of her family have knee replacement and done overall well.  She rates her pain is moderate to severe.  I have referred her to Dr. Roda Shutters as her mother had surgery with him in the past and did quite well.  In the meantime do not want to give her steroids pending discussion of knee replacement timing.  Will give her a small amount of tramadol and cautioned her with its use.  She will follow-up with Dr. Roda Shutters next week  Follow-Up Instructions: No follow-ups on file.   Orders:  Orders Placed This Encounter  Procedures   XR KNEE 3 VIEW RIGHT   XR KNEE 3 VIEW LEFT   No orders of the defined types were placed in this encounter.     Procedures: No procedures performed   Clinical Data: No additional findings.   Subjective: No chief complaint on file.   HPI pleasant  72 year old woman complains with chronic bilateral knee pain.  Left today greater than right no history of injury she has had a long history and has tried various treatments including injections.  None have been long-lasting  Review of Systems  All other systems reviewed and are negative.    Objective: Vital Signs: There were no vitals taken for this visit.  Physical Exam Constitutional:      Appearance: Normal appearance.  Skin:    General: Skin is warm and dry.  Neurological:     Mental Status: She is alert.  Psychiatric:        Mood and Affect: Mood normal.        Behavior: Behavior normal.     Ortho Exam Bilateral knees she does have a mild to moderate effusion in the left knee no redness no erythema compartments are soft and compressible she is neurovascularly intact right knee mild soft tissue swelling but no erythema she has grinding with range of motion she does come to full extension on the right just short of full extension on the left x-rays today demonstrate tricompartmental arthritis with most noted changes in the medial  compartment Specialty Comments:  No specialty comments available.  Imaging: No results found.   PMFS History: Patient Active Problem List   Diagnosis Date Noted   Varicose veins of both legs with edema 09/16/2018   Depression, major, single episode, complete remission (HCC) 09/24/2017   Routine general medical examination at a health care facility 09/24/2017   Bilateral temporomandibular joint pain 04/06/2016   Disorder of left eustachian tube 04/06/2016   Otalgia of both ears 04/06/2016   Family history of breast cancer    Allergic rhinitis 07/04/2007   Past Medical History:  Diagnosis Date   Allergy    Anxiety    Arthritis    Depression    Family history of breast cancer    Hyperlipidemia     Family History  Problem Relation Age of Onset   Breast cancer Mother 54   Bone cancer Father 61   Breast cancer Sister 48   Heart attack  Sister    Diabetes Sister    Breast cancer Maternal Aunt        dx >50   Cancer Maternal Aunt        NOS   Breast cancer Other        MGF's sister #1   Hyperlipidemia Other    Hypertension Other    Thyroid disease Other    Stomach cancer Neg Hx    Rectal cancer Neg Hx    Pancreatic cancer Neg Hx    Colon cancer Neg Hx     Past Surgical History:  Procedure Laterality Date   GUM SURGERY     HYSTEROSCOPY     KNEE SURGERY Right    Social History   Occupational History   Not on file  Tobacco Use   Smoking status: Former    Current packs/day: 0.00    Average packs/day: 1 pack/day for 15.0 years (15.0 ttl pk-yrs)    Types: Cigarettes    Start date: 04/20/1968    Quit date: 04/21/1983    Years since quitting: 40.3   Smokeless tobacco: Never  Vaping Use   Vaping status: Never Used  Substance and Sexual Activity   Alcohol use: Yes    Comment: socially   Drug use: No   Sexual activity: Not on file

## 2023-09-06 ENCOUNTER — Encounter: Payer: Self-pay | Admitting: Orthopaedic Surgery

## 2023-09-06 ENCOUNTER — Ambulatory Visit (INDEPENDENT_AMBULATORY_CARE_PROVIDER_SITE_OTHER): Payer: Medicare PPO | Admitting: Orthopaedic Surgery

## 2023-09-06 DIAGNOSIS — M170 Bilateral primary osteoarthritis of knee: Secondary | ICD-10-CM

## 2023-09-06 NOTE — Progress Notes (Signed)
Office Visit Note   Patient: Tamara Hughes           Date of Birth: March 22, 1951           MRN: [id number] Visit Date: 09/06/2023              Requested by: Shirline Frees, NP [address] [place],  [place] [address] PCP: Shirline Frees, NP   Assessment & Plan: Visit Diagnoses:  1. Bilateral primary osteoarthritis of knee     Plan: Impression is bilateral knee osteoarthritis.  Today, we discussed various treatment options to include repeat cortisone injection versus total knee arthroplasty.  Currently not interested in injections at this point.  She would like to think about knee replacement surgery.  She will follow-up with [name] once she is ready to schedule.  Call with concerns or questions in the meantime.  Follow-Up Instructions: Return if symptoms worsen or fail to improve.   Orders:  No orders of the defined types were placed in this encounter.  No orders of the defined types were placed in this encounter.     Procedures: No procedures performed   Clinical Data: No additional findings.   Subjective: Chief Complaint  Patient presents with   Right Knee - Pain   Left Knee - Pain    HPI patient is a pleasant 72 year old female who comes in today with bilateral knee pain both equally as bad.  Symptoms have been ongoing for years and have progressively worsened.  She has undergone cortisone injections in the past with good relief.  Her last injection was years ago.  She has more recently undergone what sounds like viscosupplementation injections at a clinic in [facility].  She notes the first series helped but did not get relief from the second series.  Review of Systems as detailed in HPI.  All others reviewed and are negative.   Objective: Vital Signs: There were no vitals taken for this visit.  Physical Exam well-developed well-nourished female no acute distress.  Alert and oriented x 3.  Ortho Exam unchanged bilateral knee exam  Specialty Comments:  No  specialty comments available.  Imaging: No new imaging   PMFS History: Patient Active Problem List   Diagnosis Date Noted   Varicose veins of both legs with edema 09/16/2018   Depression, major, single episode, complete remission (HCC) 09/24/2017   Routine general medical examination at a health care facility 09/24/2017   Bilateral temporomandibular joint pain 04/06/2016   Disorder of left eustachian tube 04/06/2016   Otalgia of both ears 04/06/2016   Family history of breast cancer    Allergic rhinitis 07/04/2007   Past Medical History:  Diagnosis Date   Allergy    Anxiety    Arthritis    Depression    Family history of breast cancer    Hyperlipidemia     Family History  Problem Relation Age of Onset   Breast cancer Mother 56   Bone cancer Father 26   Breast cancer Sister 19   Heart attack Sister    Diabetes Sister    Breast cancer Maternal Aunt        dx >50   Cancer Maternal Aunt        NOS   Breast cancer Other        MGF's sister #1   Hyperlipidemia Other    Hypertension Other    Thyroid disease Other    Stomach cancer Neg Hx    Rectal cancer Neg Hx  Pancreatic cancer Neg Hx    Colon cancer Neg Hx     Past Surgical History:  Procedure Laterality Date   GUM SURGERY     HYSTEROSCOPY     KNEE SURGERY Right    Social History   Occupational History   Not on file  Tobacco Use   Smoking status: Former    Current packs/day: 0.00    Average packs/day: 1 pack/day for 15.0 years (15.0 ttl pk-yrs)    Types: Cigarettes    Start date: 04/20/1968    Quit date: 04/21/1983    Years since quitting: 40.4   Smokeless tobacco: Never  Vaping Use   Vaping status: Never Used  Substance and Sexual Activity   Alcohol use: Yes    Comment: socially   Drug use: No   Sexual activity: Not on file

## 2023-09-17 ENCOUNTER — Ambulatory Visit
Admission: RE | Admit: 2023-09-17 | Discharge: 2023-09-17 | Disposition: A | Discharge status: Home / Self Care | Payer: Medicare PPO | Source: Ambulatory Visit

## 2023-09-17 DIAGNOSIS — Z1231 Encounter for screening mammogram for malignant neoplasm of breast: Secondary | ICD-10-CM

## 2023-09-25 ENCOUNTER — Encounter: Payer: Self-pay | Admitting: Orthopaedic Surgery

## 2023-09-28 DIAGNOSIS — M62830 Muscle spasm of back: Secondary | ICD-10-CM | POA: Diagnosis not present

## 2023-09-28 DIAGNOSIS — M1990 Unspecified osteoarthritis, unspecified site: Secondary | ICD-10-CM | POA: Diagnosis not present

## 2023-09-28 DIAGNOSIS — M3500 Sicca syndrome, unspecified: Secondary | ICD-10-CM | POA: Diagnosis not present

## 2023-09-28 DIAGNOSIS — Z880 Allergy status to penicillin: Secondary | ICD-10-CM | POA: Diagnosis not present

## 2023-09-28 DIAGNOSIS — F32A Depression, unspecified: Secondary | ICD-10-CM | POA: Diagnosis not present

## 2023-09-28 DIAGNOSIS — F419 Anxiety disorder, unspecified: Secondary | ICD-10-CM | POA: Diagnosis not present

## 2023-09-28 DIAGNOSIS — E785 Hyperlipidemia, unspecified: Secondary | ICD-10-CM | POA: Diagnosis not present

## 2023-09-28 DIAGNOSIS — M5440 Lumbago with sciatica, unspecified side: Secondary | ICD-10-CM | POA: Diagnosis not present

## 2023-09-28 DIAGNOSIS — Z720 Tobacco use: Secondary | ICD-10-CM | POA: Diagnosis not present

## 2023-10-10 ENCOUNTER — Encounter: Payer: Self-pay | Admitting: Adult Health

## 2023-10-11 ENCOUNTER — Encounter: Payer: Self-pay | Admitting: Orthopaedic Surgery

## 2023-10-12 ENCOUNTER — Telehealth: Payer: Self-pay | Admitting: Orthopaedic Surgery

## 2023-10-17 ENCOUNTER — Ambulatory Visit: Payer: Medicare PPO

## 2023-10-17 VITALS — Ht 63.0 in | Wt 162.0 lb

## 2023-10-17 DIAGNOSIS — Z0000 Encounter for general adult medical examination without abnormal findings: Secondary | ICD-10-CM

## 2023-10-17 NOTE — Progress Notes (Signed)
Subjective:   Tamara Hughes is a 72 y.o. female who presents for Medicare Annual (Subsequent) preventive examination.  Visit Complete: Virtual I connected with  Tamara Hughes on 10/17/23 by a audio enabled telemedicine application and verified that I am speaking with the correct person using two identifiers.  Patient Location: Home  Provider Location: Home Office  I discussed the limitations of evaluation and management by telemedicine. The patient expressed understanding and agreed  to proceed.  Vital Signs: Because this visit was a virtual/telehealth visit, some criteria may be missing or patient reported. Any vitals not documented were not able to be obtained and vitals that have been documented are patient reported.  Patient Medicare AWV questionnaire was completed by the patient on 10/11/23; I have confirmed that all information answered by patient is correct and no changes since this date.  Cardiac Risk Factors include: advanced age (>28men, >17 women)     Objective:    Today's Vitals   10/17/23 1408  Weight: 162 lb (73.5 kg)  Height: 5\' 3"  (1.6 m)   Body mass index is 28.7 kg/m.     10/17/2023    2:12 PM 10/13/2022   11:46 AM 10/12/2021    1:57 PM 09/22/2020    9:04 AM 09/04/2020    3:45 PM 11/07/2019   10:21 AM 07/25/2019   10:27 AM  Advanced Directives  Does Patient Have a Medical Advance Directive? No No No No No No No  Would patient like information on creating a medical advance directive? No - Patient declined No - Patient declined No - Patient declined No - Patient declined No - Patient declined No - Patient declined No - Patient declined    Current Medications (verified) Outpatient Encounter Medications as of 10/17/2023  Medication Sig   budesonide-formoterol (SYMBICORT) 160-4.5 MCG/ACT inhaler Inhale 2 puffs into the lungs 2 (two) times daily.   citalopram (CELEXA) 40 MG tablet TAKE 1 TABLET(40 MG) BY MOUTH DAILY   cyclobenzaprine (FLEXERIL) 10 MG tablet  Take 1 tablet (10 mg total) by mouth at bedtime.   fluticasone (FLONASE) 50 MCG/ACT nasal spray INHALE 2 SPRAYS INTO BOTH NOSTRILS ONCE DAILY   LORazepam (ATIVAN) 0.5 MG tablet take 1 tablet by mouth twice a day if needed   methylPREDNISolone (MEDROL DOSEPAK) 4 MG TBPK tablet Take as directed   Multiple Vitamin (MULTIVITAMIN) tablet Take 1 tablet by mouth daily.   OVER THE COUNTER MEDICATION Place 2 drops into both eyes daily as needed (dry eyes).   pseudoephedrine (SUDAFED) 30 MG tablet Take 1 tablet (30 mg total) by mouth every 8 (eight) hours as needed for congestion.   RESTASIS 0.05 % ophthalmic emulsion INT 1 DROP AEY BID   simvastatin (ZOCOR) 40 MG tablet TAKE 1 TABLET(40 MG) BY MOUTH AT BEDTIME   traMADol (ULTRAM) 50 MG tablet Take 1 tablet (50 mg total) by mouth every 6 (six) hours as needed.   vitamin C (ASCORBIC ACID) 250 MG tablet Take 500 mg by mouth daily.   No facility-administered encounter medications on file as of 10/17/2023.    Allergies (verified) Doxycycline, Ether, Indomethacin, and Penicillins   History: Past Medical History:  Diagnosis Date   Allergy    Anxiety    Arthritis    Depression    Family history of breast cancer    Hyperlipidemia    Past Surgical History:  Procedure Laterality Date   GUM SURGERY     HYSTEROSCOPY     KNEE SURGERY Right  Family History  Problem Relation Age of Onset   Breast cancer Mother 48   Bone cancer Father 60   Breast cancer Sister 71   Heart attack Sister    Diabetes Sister    Breast cancer Maternal Aunt        dx >50   Cancer Maternal Aunt        NOS   Breast cancer Other        MGF's sister #1   Hyperlipidemia Other    Hypertension Other    Thyroid disease Other    Stomach cancer Neg Hx    Rectal cancer Neg Hx    Pancreatic cancer Neg Hx    Colon cancer Neg Hx    Social History   Socioeconomic History   Marital status: Single    Spouse name: Not on file   Number of children: 1   Years of education:  Not on file   Highest education level: Bachelor's degree (e.g., BA, AB, BS)  Occupational History   Not on file  Tobacco Use   Smoking status: Former    Current packs/day: 0.00    Average packs/day: 1 pack/day for 15.0 years (15.0 ttl pk-yrs)    Types: Cigarettes    Start date: 04/20/1968    Quit date: 04/21/1983    Years since quitting: 40.5   Smokeless tobacco: Never  Vaping Use   Vaping status: Never Used  Substance and Sexual Activity   Alcohol use: Yes    Comment: socially   Drug use: No   Sexual activity: Not on file  Other Topics Concern   Not on file  Social History Narrative   Not on file   Social Determinants of Health   Financial Resource Strain: Low Risk  (10/11/2023)   Overall Financial Resource Strain (CARDIA)    Difficulty of Paying Living Expenses: Not hard at all  Food Insecurity: No Food Insecurity (10/11/2023)   Hunger Vital Sign    Worried About Running Out of Food in the Last Year: Never true    Ran Out of Food in the Last Year: Never true  Transportation Needs: No Transportation Needs (10/11/2023)   PRAPARE - Administrator, Civil Service (Medical): No    Lack of Transportation (Non-Medical): No  Physical Activity: Insufficiently Active (10/11/2023)   Exercise Vital Sign    Days of Exercise per Week: 1 day    Minutes of Exercise per Session: 30 min  Stress: No Stress Concern Present (10/11/2023)   [employer] of Occupational Health - Occupational Stress Questionnaire    Feeling of Stress : Only a little  Social Connections: Moderately Integrated (10/11/2023)   Social Connection and Isolation Panel [NHANES]    Frequency of Communication with Friends and Family: Three times a week    Frequency of Social Gatherings with Friends and Family: Once a week    Attends Religious Services: More than 4 times per year    Active Member of [employer] or Organizations: Yes    Attends Engineer, structural: More than 4 times per year     Marital Status: Never married    Tobacco Counseling Counseling given: Not Answered   Clinical Intake:  Pre-visit preparation completed: Yes  Pain : No/denies pain     BMI - recorded: 28.7 Nutritional Status: BMI 25 -29 Overweight Nutritional Risks: None Diabetes: No  How often do you need to have someone help you when you read instructions, pamphlets, or other written materials from your  doctor or pharmacy?: 1 - Never  Interpreter Needed?: No  Information entered by :: Theresa Mulligan LPN   Activities of Daily Living    10/11/2023    2:35 PM 11/02/2022   11:11 AM  In your present state of health, do you have any difficulty performing the following activities:  Hearing? 1 1  Comment Wears hearing aids hearing aids  Vision? 0 0  Difficulty concentrating or making decisions? 0   Walking or climbing stairs? 0 1  Comment  joint issues  Dressing or bathing? 0 0  Doing errands, shopping? 0 0  Preparing Food and eating ? N   Using the Toilet? N   In the past six months, have you accidently leaked urine? N   Do you have problems with loss of bowel control? N   Managing your Medications? N   Managing your Finances? N   Housekeeping or managing your Housekeeping? N     Patient Care Team: Shirline Frees, NP as PCP - General (Family Medicine)  Indicate any recent Medical Services you may have received from other than [facility] providers in the past year (date may be approximate).     Assessment:   This is a routine wellness examination for Osheanna.  Hearing/Vision screen Hearing Screening - Comments:: Wears hearing aids Vision Screening - Comments:: Wears rx glasses - up to date with routine eye exams with  Dr Emily Filbert   Goals Addressed               This Visit's Progress     Patient stated (pt-stated)        Eat healthy!       Depression Screen    10/17/2023    2:18 PM 11/02/2022   11:10 AM 10/13/2022   11:42 AM 06/21/2022   10:50 AM 06/13/2022    2:56 PM  11/01/2021   10:50 AM 10/12/2021    1:58 PM  PHQ 2/9 Scores  PHQ - 2 Score 0 0 0 0 0 0 0  PHQ- 9 Score  0  3 0      Fall Risk    10/17/2023    2:20 PM 10/11/2023    2:35 PM 05/20/2023   11:53 PM 11/02/2022   11:08 AM 10/13/2022   11:45 AM  Fall Risk   Falls in the past year? 1 1 0 1 1  Number falls in past yr: 0 0  1 0  Injury with Fall? 0 0  0 0  Risk for fall due to : No Fall Risks   History of fall(s) No Fall Risks  Follow up Falls prevention discussed   Falls evaluation completed     MEDICARE RISK AT HOME: Medicare Risk at Home Any stairs in or around the home?: Yes If so, are there any without handrails?: No Home free of loose throw rugs in walkways, pet beds, electrical cords, etc?: No Adequate lighting in your home to reduce risk of falls?: Yes Life alert?: No Use of a cane, walker or w/c?: No Grab bars in the bathroom?: Yes Shower chair or bench in shower?: No Elevated toilet seat or a handicapped toilet?: Yes  TIMED UP AND GO:  Was the test performed?  No    Cognitive Function:        10/17/2023    2:12 PM 10/13/2022   11:46 AM  6CIT Screen  What Year? 0 points 0 points  What month? 0 points 0 points  What time? 0 points 0 points  Count back from 20 0 points 0 points  Months in reverse 0 points 0 points  Repeat phrase 0 points 0 points  Total Score 0 points 0 points    Immunizations Immunization History  Administered Date(s) Administered   Fluad Quad(high Dose 65+) 09/24/2019, 10/20/2020, 11/01/2021, 11/02/2022   Influenza Split 09/19/2012   Influenza, High Dose Seasonal PF 09/28/2015, 09/13/2016, 09/24/2017   Influenza,inj,Quad PF,6+ Mos 09/24/2014, 09/16/2018   Moderna Sars-Covid-2 Vaccination 02/16/2020, 03/23/2020, 03/27/2021   Pneumococcal Conjugate-13 09/24/2017   Pneumococcal Polysaccharide-23 09/24/2019   Td 12/25/2004, 09/28/2015    TDAP status: Up to date  Flu Vaccine status: Due, Education has been provided regarding the  importance of this vaccine. Advised may receive this vaccine at local pharmacy or Health Dept. Aware to provide a copy of the vaccination record if obtained from local pharmacy or Health Dept. Verbalized acceptance and understanding.  Pneumococcal vaccine status: Up to date  Covid-19 vaccine status: Declined, Education has been provided regarding the importance of this vaccine but patient still declined. Advised may receive this vaccine at local pharmacy or Health Dept.or vaccine clinic. Aware to provide a copy of the vaccination record if obtained from local pharmacy or Health Dept. Verbalized acceptance and understanding.  Qualifies for Shingles Vaccine? Yes   Zostavax completed No   Shingrix Completed?: No.    Education has been provided regarding the importance of this vaccine. Patient has been advised to call insurance company to determine out of pocket expense if they have not yet received this vaccine. Advised may also receive vaccine at local pharmacy or Health Dept. Verbalized acceptance and understanding.  Screening Tests Health Maintenance  Topic Date Due   Zoster Vaccines- Shingrix (1 of 2) Never done   INFLUENZA VACCINE  07/26/2023   COVID-19 Vaccine (4 - 2023-24 season) 08/26/2023   MAMMOGRAM  09/16/2024   Medicare Annual Wellness (AWV)  10/16/2024   DTaP/Tdap/Td (3 - Tdap) 09/27/2025   Colonoscopy  12/28/2027   Pneumonia Vaccine 8+ Years old  Completed   DEXA SCAN  Completed   Hepatitis C Screening  Completed   HPV VACCINES  Aged Out    Health Maintenance  Health Maintenance Due  Topic Date Due   Zoster Vaccines- Shingrix (1 of 2) Never done   INFLUENZA VACCINE  07/26/2023   COVID-19 Vaccine (4 - 2023-24 season) 08/26/2023    Colorectal cancer screening: Type of screening: Colonoscopy. Completed 12/27/17. Repeat every 10 years  Mammogram status: Completed 09/17/23. Repeat every year  Bone Density status: Completed 11/08/22. Results reflect: Bone density results:  OSTEOPOROSIS. Repeat every   years.    Additional Screening:  Hepatitis C Screening: does qualify; Completed 11/01/21  Vision Screening: Recommended annual ophthalmology exams for early detection of glaucoma and other disorders of the eye. Is the patient up to date with their annual eye exam?  Yes  Who is the provider or what is the name of the office in which the patient attends annual eye exams? Dr Emily Filbert If pt is not established with a provider, would they like to be referred to a provider to establish care? No .   Dental Screening: Recommended annual dental exams for proper oral hygiene    Community Resource Referral / Chronic Care Management:  CRR required this visit?  No   CCM required this visit?  No     Plan:     I have personally reviewed and noted the following in the patient's chart:   Medical and social history Use of  alcohol, tobacco or illicit drugs  Current medications and supplements including opioid prescriptions. Patient is currently taking opioid prescriptions. Information provided to patient regarding non-opioid alternatives. Patient advised to discuss non-opioid treatment plan with their provider. Functional ability and status Nutritional status Physical activity Advanced directives List of other physicians Hospitalizations, surgeries, and ER visits in previous 12 months Vitals Screenings to include cognitive, depression, and falls Referrals and appointments  In addition, I have reviewed and discussed with patient certain preventive protocols, quality metrics, and best practice recommendations. A written personalized care plan for preventive services as well as general preventive health recommendations were provided to patient.     Tillie Rung, LPN   [id number]   After Visit Summary: (MyChart) Due to this being a telephonic visit, the after visit summary with patients personalized plan was offered to patient via MyChart   Nurse Notes:  None

## 2023-10-17 NOTE — Patient Instructions (Addendum)
Tamara Hughes , Thank you for taking time to come for your Medicare Wellness Visit. I appreciate your ongoing commitment to your health goals. Please review the following plan we discussed and let me know if I can assist you in the future.   Referrals/Orders/Follow-Ups/Clinician Recommendations:   This is a list of the screening recommended for you and due dates:  Health Maintenance  Topic Date Due   Zoster (Shingles) Vaccine (1 of 2) Never done   Flu Shot  07/26/2023   COVID-19 Vaccine (4 - 2023-24 season) 08/26/2023   Mammogram  09/16/2024   Medicare Annual Wellness Visit  10/16/2024   DTaP/Tdap/Td vaccine (3 - Tdap) 09/27/2025   Colon Cancer Screening  12/28/2027   Pneumonia Vaccine  Completed   DEXA scan (bone density measurement)  Completed   Hepatitis C Screening  Completed   HPV Vaccine  Aged Out   Opioid Pain Medicine Management Opioids are powerful medicines that are used to treat moderate to severe pain. When used for short periods of time, they can help you to: Sleep better. Do better in physical or occupational therapy. Feel better in the first few days after an injury. Recover from surgery. Opioids should be taken with the supervision of a trained health care provider. They should be taken for the shortest period of time possible. This is because opioids can be addictive, and the longer you take opioids, the greater your risk of addiction. This addiction can also be called opioid use disorder. What are the risks? Using opioid pain medicines for longer than 3 days increases your risk of side effects. Side effects include: Constipation. Nausea and vomiting. Breathing difficulties (respiratory depression). Drowsiness. Confusion. Opioid use disorder. Itching. Taking opioid pain medicine for a long period of time can affect your ability to do daily tasks. It also puts you at risk for: Motor vehicle crashes. Depression. Suicide. Heart attack. Overdose, which can be  life-threatening. What is a pain treatment plan? A pain treatment plan is an agreement between you and your health care provider. Pain is unique to each person, and treatments vary depending on your condition. To manage your pain, you and your health care provider need to work together. To help you do this: Discuss the goals of your treatment, including how much pain you might expect to have and how you will manage the pain. Review the risks and benefits of taking opioid medicines. Remember that a good treatment plan uses more than one approach and minimizes the chance of side effects. Be honest about the amount of medicines you take and about any drug or alcohol use. Get pain medicine prescriptions from only one health care provider. Pain can be managed with many types of alternative treatments. Ask your health care provider to refer you to one or more specialists who can help you manage pain through: Physical or occupational therapy. Counseling (cognitive behavioral therapy). Good nutrition. Biofeedback. Massage. Meditation. Non-opioid medicine. Following a gentle exercise program. How to use opioid pain medicine Taking medicine Take your pain medicine exactly as told by your health care provider. Take it only when you need it. If your pain gets less severe, you may take less than your prescribed dose if your health care provider approves. If you are not having pain, do nottake pain medicine unless your health care provider tells you to take it. If your pain is severe, do nottry to treat it yourself by taking more pills than instructed on your prescription. Contact your health care provider for  help. Write down the times when you take your pain medicine. It is easy to become confused while on pain medicine. Writing the time can help you avoid overdose. Take other over-the-counter or prescription medicines only as told by your health care provider. Keeping yourself and others safe  While  you are taking opioid pain medicine: Do not drive, use machinery, or power tools. Do not sign legal documents. Do not drink alcohol. Do not take sleeping pills. Do not supervise children by yourself. Do not do activities that require climbing or being in high places. Do not go to a lake, river, ocean, spa, or swimming pool. Do not share your pain medicine with anyone. Keep pain medicine in a locked cabinet or in a secure area where pets and children cannot reach it. Stopping your use of opioids If you have been taking opioid medicine for more than a few weeks, you may need to slowly decrease (taper) how much you take until you stop completely. Tapering your use of opioids can decrease your risk of symptoms of withdrawal, such as: Pain and cramping in the abdomen. Nausea. Sweating. Sleepiness. Restlessness. Uncontrollable shaking (tremors). Cravings for the medicine. Do not attempt to taper your use of opioids on your own. Talk with your health care provider about how to do this. Your health care provider may prescribe a step-down schedule based on how much medicine you are taking and how long you have been taking it. Getting rid of leftover pills Do not save any leftover pills. Get rid of leftover pills safely by: Taking the medicine to a prescription take-back program. This is usually offered by the county or law enforcement. Bringing them to a pharmacy that has a drug disposal container. Flushing them down the toilet. Check the label or package insert of your medicine to see whether this is safe to do. Throwing them out in the trash. Check the label or package insert of your medicine to see whether this is safe to do. If it is safe to throw it out, remove the medicine from the original container, put it into a sealable bag or container, and mix it with used coffee grounds, food scraps, dirt, or cat litter before putting it in the trash. Follow these instructions at home: Activity Do  exercises as told by your health care provider. Avoid activities that make your pain worse. Return to your normal activities as told by your health care provider. Ask your health care provider what activities are safe for you. General instructions You may need to take these actions to prevent or treat constipation: Drink enough fluid to keep your urine pale yellow. Take over-the-counter or prescription medicines. Eat foods that are high in fiber, such as beans, whole grains, and fresh fruits and vegetables. Limit foods that are high in fat and processed sugars, such as fried or sweet foods. Keep all follow-up visits. This is important. Where to find support If you have been taking opioids for a long time, you may benefit from receiving support for quitting from a local support group or counselor. Ask your health care provider for a referral to these resources in your area. Where to find more information Centers for Disease Control and Prevention (CDC): FootballExhibition.com.br [place] Food and Drug Administration (FDA): PumpkinSearch.com.ee Get help right away if: You may have taken too much of an opioid (overdosed). Common symptoms of an overdose: Your breathing is slower or more shallow than normal. You have a very slow heartbeat (pulse). You have slurred speech.  You have nausea and vomiting. Your pupils become very small. You have other potential symptoms: You are very confused. You faint or feel like you will faint. You have cold, clammy skin. You have blue lips or fingernails. You have thoughts of harming yourself or harming others. These symptoms may represent a serious problem that is an emergency. Do not wait to see if the symptoms will go away. Get medical help right away. Call your local emergency services (911 in the [place] Do not drive yourself to the hospital.  If you ever feel like you may hurt yourself or others, or have thoughts about taking your own life, get help right away. Go to your nearest  emergency department or: Call your local emergency services (911 in the [place] Call the [facility] (2533897816 in the [place] Call a suicide crisis helpline, such as the National Suicide Prevention Lifeline at 504-535-2868 or 988 in the [place] This is open 24 hours a day in the [place] Text the Crisis Text Line at 775-041-4811 (in the [place] Summary Opioid medicines can help you manage moderate to severe pain for a short period of time. A pain treatment plan is an agreement between you and your health care provider. Discuss the goals of your treatment, including how much pain you might expect to have and how you will manage the pain. If you think that you or someone else may have taken too much of an opioid, get medical help right away. This information is not intended to replace advice given to you by your health care provider. Make sure you discuss any questions you have with your health care provider. Document Revised: 07/06/2021 Document Reviewed: 03/23/2021 Elsevier Patient Education  2024 [employer]  Advanced directives: (Declined) Advance directive discussed with you today. Even though you declined this today, please call our office should you change your mind, and we can give you the proper paperwork for you to fill out.  Next Medicare Annual Wellness Visit scheduled for next year: Yes

## 2023-11-08 ENCOUNTER — Ambulatory Visit: Payer: Medicare PPO | Admitting: Adult Health

## 2023-11-08 ENCOUNTER — Encounter: Payer: Self-pay | Admitting: Adult Health

## 2023-11-08 VITALS — BP 130/80 | HR 76 | Temp 98.2°F | Ht 63.5 in | Wt 167.0 lb

## 2023-11-08 DIAGNOSIS — J3089 Other allergic rhinitis: Secondary | ICD-10-CM | POA: Diagnosis not present

## 2023-11-08 DIAGNOSIS — E559 Vitamin D deficiency, unspecified: Secondary | ICD-10-CM | POA: Diagnosis not present

## 2023-11-08 DIAGNOSIS — F419 Anxiety disorder, unspecified: Secondary | ICD-10-CM | POA: Diagnosis not present

## 2023-11-08 DIAGNOSIS — Z0000 Encounter for general adult medical examination without abnormal findings: Secondary | ICD-10-CM | POA: Diagnosis not present

## 2023-11-08 DIAGNOSIS — Z01818 Encounter for other preprocedural examination: Secondary | ICD-10-CM

## 2023-11-08 DIAGNOSIS — Z23 Encounter for immunization: Secondary | ICD-10-CM | POA: Diagnosis not present

## 2023-11-08 DIAGNOSIS — F32A Depression, unspecified: Secondary | ICD-10-CM | POA: Diagnosis not present

## 2023-11-08 DIAGNOSIS — E785 Hyperlipidemia, unspecified: Secondary | ICD-10-CM | POA: Diagnosis not present

## 2023-11-08 LAB — CBC WITH DIFFERENTIAL/PLATELET
Basophils Absolute: 0.1 10*3/uL (ref 0.0–0.1)
Basophils Relative: 1.4 % (ref 0.0–3.0)
Eosinophils Absolute: 0.2 10*3/uL (ref 0.0–0.7)
Eosinophils Relative: 3.1 % (ref 0.0–5.0)
HCT: 40.3 % (ref 36.0–46.0)
Hemoglobin: 12.7 g/dL (ref 12.0–15.0)
Lymphocytes Relative: 21.5 % (ref 12.0–46.0)
Lymphs Abs: 1.2 10*3/uL (ref 0.7–4.0)
MCHC: 31.6 g/dL (ref 30.0–36.0)
MCV: 82 fL (ref 78.0–100.0)
Monocytes Absolute: 0.6 10*3/uL (ref 0.1–1.0)
Monocytes Relative: 10.9 % (ref 3.0–12.0)
Neutro Abs: 3.5 10*3/uL (ref 1.4–7.7)
Neutrophils Relative %: 63.1 % (ref 43.0–77.0)
Platelets: 269 10*3/uL (ref 150.0–400.0)
RBC: 4.91 Mil/uL (ref 3.87–5.11)
RDW: 14.9 % (ref 11.5–15.5)
WBC: 5.5 10*3/uL (ref 4.0–10.5)

## 2023-11-08 LAB — LIPID PANEL
Cholesterol: 178 mg/dL (ref 0–200)
HDL: 50.4 mg/dL (ref >39.00)
LDL Cholesterol: 98 mg/dL (ref 0–99)
NonHDL: 127.74
Total CHOL/HDL Ratio: 4
Triglycerides: 147 mg/dL (ref 0.0–149.0)
VLDL: 29.4 mg/dL (ref 0.0–40.0)

## 2023-11-08 LAB — TSH: TSH: 1.23 u[IU]/mL (ref 0.35–5.50)

## 2023-11-08 LAB — COMPREHENSIVE METABOLIC PANEL
ALT: 15 U/L (ref 0–35)
AST: 19 U/L (ref 0–37)
Albumin: 4.2 g/dL (ref 3.5–5.2)
Alkaline Phosphatase: 54 U/L (ref 39–117)
BUN: 11 mg/dL (ref 6–23)
CO2: 30 meq/L (ref 19–32)
Calcium: 9.4 mg/dL (ref 8.4–10.5)
Chloride: 105 meq/L (ref 96–112)
Creatinine, Ser: 0.67 mg/dL (ref 0.40–1.20)
GFR: 86.27 mL/min (ref >60.00)
Glucose, Bld: 83 mg/dL (ref 70–99)
Potassium: 4 meq/L (ref 3.5–5.1)
Sodium: 141 meq/L (ref 135–145)
Total Bilirubin: 0.6 mg/dL (ref 0.2–1.2)
Total Protein: 6.8 g/dL (ref 6.0–8.3)

## 2023-11-08 LAB — VITAMIN D 25 HYDROXY (VIT D DEFICIENCY, FRACTURES): VITD: 35.64 ng/mL (ref 30.00–100.00)

## 2023-11-08 MED ORDER — FLUTICASONE PROPIONATE 50 MCG/ACT NA SUSP: NASAL | 6 refills | Status: DC

## 2023-11-08 MED ORDER — SIMVASTATIN 40 MG PO TABS: ORAL_TABLET | ORAL | 3 refills | Status: DC

## 2023-11-08 MED ORDER — LORAZEPAM 0.5 MG PO TABS: ORAL_TABLET | ORAL | 2 refills | Status: DC

## 2023-11-08 NOTE — Progress Notes (Signed)
Subjective:    Patient ID: Tamara Hughes, female    DOB: [birth year], 72 y.o.   MRN: [id number]  HPI Patient presents for yearly preventative medicine examination. She is a pleasant 72 year old female who  has a past medical history of Allergy, Anxiety, Arthritis, Depression, Family history of breast cancer, and Hyperlipidemia.  Anxiety/depression-currently managed with Celexa 40 mg nightly and Ativan 0.5 mg twice daily as needed( does not use often).She does feel well controlled on this regimen  Hyperlipidemia-takes Zocor 40 mg nightly.  She denies myalgia or fatigue Lab Results  Component Value Date   CHOL 167 11/02/2022   HDL 60.30 11/02/2022   LDLCALC 87 11/02/2022   TRIG 101.0 11/02/2022   CHOLHDL 3 11/02/2022    Vitamin D deficiency - takes vitamin D  and calcium.   Preoperative Clearance - she will be having right total knee replacement - date has not been scheduled yet. She is tired of being in pain and is ready to have the surgery done.    All immunizations and health maintenance protocols were reviewed with the patient and needed orders were placed.  Appropriate screening laboratory values were ordered for the patient including screening of hyperlipidemia, renal function and hepatic function. If indicated by BPH, a PSA was ordered.  Medication reconciliation,  past medical history, social history, problem list and allergies were reviewed in detail with the patient  Goals were established with regard to weight loss, exercise, and  diet in compliance with medications Wt Readings from Last 3 Encounters:  11/08/23 167 lb (75.8 kg)  10/17/23 162 lb (73.5 kg)  05/22/23 162 lb 12.8 oz (73.8 kg)    She is up to date on routine colon cancer screening and mammograms.    Review of Systems  Constitutional: Negative.   HENT: Negative.    Eyes: Negative.   Respiratory: Negative.    Cardiovascular: Negative.   Gastrointestinal: Negative.   Endocrine: Negative.    Genitourinary: Negative.   Musculoskeletal:  Positive for arthralgias.  Skin: Negative.   Allergic/Immunologic: Negative.   Neurological: Negative.   Hematological: Negative.   Psychiatric/Behavioral: Negative.     Past Medical History:  Diagnosis Date   Allergy    Anxiety    Arthritis    Depression    Family history of breast cancer    Hyperlipidemia     Social History   Socioeconomic History   Marital status: Single    Spouse name: Not on file   Number of children: 1   Years of education: Not on file   Highest education level: Bachelor's degree (e.g., BA, AB, BS)  Occupational History   Not on file  Tobacco Use   Smoking status: Former    Current packs/day: 0.00    Average packs/day: 1 pack/day for 15.0 years (15.0 ttl pk-yrs)    Types: Cigarettes    Start date: 04/20/1968    Quit date: 04/21/1983    Years since quitting: 40.5   Smokeless tobacco: Never  Vaping Use   Vaping status: Never Used  Substance and Sexual Activity   Alcohol use: Yes    Comment: socially   Drug use: No   Sexual activity: Not on file  Other Topics Concern   Not on file  Social History Narrative   Not on file   Social Determinants of Health   Financial Resource Strain: Low Risk  (11/01/2023)   Overall Financial Resource Strain (CARDIA)    Difficulty of Paying Living Expenses: Not  hard at all  Food Insecurity: No Food Insecurity (11/01/2023)   Hunger Vital Sign    Worried About Running Out of Food in the Last Year: Never true    Ran Out of Food in the Last Year: Never true  Transportation Needs: No Transportation Needs (11/01/2023)   PRAPARE - Administrator, Civil Service (Medical): No    Lack of Transportation (Non-Medical): No  Physical Activity: Insufficiently Active (11/01/2023)   Exercise Vital Sign    Days of Exercise per Week: 1 day    Minutes of Exercise per Session: 30 min  Stress: Stress Concern Present (11/01/2023)   [employer] of Occupational Health -  Occupational Stress Questionnaire    Feeling of Stress : To some extent  Social Connections: Moderately Integrated (11/01/2023)   Social Connection and Isolation Panel [NHANES]    Frequency of Communication with Friends and Family: Three times a week    Frequency of Social Gatherings with Friends and Family: Once a week    Attends Religious Services: More than 4 times per year    Active Member of [employer] or Organizations: Yes    Attends Engineer, structural: More than 4 times per year    Marital Status: Never married  Intimate Partner Violence: Not At Risk (10/17/2023)   Humiliation, Afraid, Rape, and Kick questionnaire    Fear of Current or Ex-Partner: No    Emotionally Abused: No    Physically Abused: No    Sexually Abused: No    Past Surgical History:  Procedure Laterality Date   GUM SURGERY     HYSTEROSCOPY     KNEE SURGERY Right     Family History  Problem Relation Age of Onset   Breast cancer Mother 62   Bone cancer Father 69   Breast cancer Sister 80   Heart attack Sister    Diabetes Sister    Breast cancer Maternal Aunt        dx >50   Cancer Maternal Aunt        NOS   Breast cancer Other        MGF's sister #1   Hyperlipidemia Other    Hypertension Other    Thyroid disease Other    Stomach cancer Neg Hx    Rectal cancer Neg Hx    Pancreatic cancer Neg Hx    Colon cancer Neg Hx     Allergies  Allergen Reactions   Doxycycline     REACTION: rash   Ether    Indomethacin     REACTION: upset stomach   Penicillins     REACTION: Hives    Current Outpatient Medications on File Prior to Visit  Medication Sig Dispense Refill   budesonide-formoterol (SYMBICORT) 160-4.5 MCG/ACT inhaler Inhale 2 puffs into the lungs 2 (two) times daily. 1 each 0   citalopram (CELEXA) 40 MG tablet TAKE 1 TABLET(40 MG) BY MOUTH DAILY 90 tablet 0   cyclobenzaprine (FLEXERIL) 10 MG tablet Take 1 tablet (10 mg total) by mouth at bedtime. 30 tablet 0   fluticasone (FLONASE)  50 MCG/ACT nasal spray INHALE 2 SPRAYS INTO BOTH NOSTRILS ONCE DAILY 16 g 6   LORazepam (ATIVAN) 0.5 MG tablet take 1 tablet by mouth twice a day if needed 30 tablet 2   methylPREDNISolone (MEDROL DOSEPAK) 4 MG TBPK tablet Take as directed 21 tablet 0   Multiple Vitamin (MULTIVITAMIN) tablet Take 1 tablet by mouth daily.     OVER THE COUNTER MEDICATION Place  2 drops into both eyes daily as needed (dry eyes).     pseudoephedrine (SUDAFED) 30 MG tablet Take 1 tablet (30 mg total) by mouth every 8 (eight) hours as needed for congestion. 30 tablet 0   RESTASIS 0.05 % ophthalmic emulsion INT 1 DROP AEY BID  3   simvastatin (ZOCOR) 40 MG tablet TAKE 1 TABLET(40 MG) BY MOUTH AT BEDTIME 90 tablet 3   traMADol (ULTRAM) 50 MG tablet Take 1 tablet (50 mg total) by mouth every 6 (six) hours as needed. 20 tablet 0   vitamin C (ASCORBIC ACID) 250 MG tablet Take 500 mg by mouth daily.     No current facility-administered medications on file prior to visit.    BP 130/80   Pulse 76   Temp 98.2 F (36.8 C) (Oral)   Ht 5' 3.5" (1.613 m)   Wt 167 lb (75.8 kg)   SpO2 97%   BMI 29.12 kg/m       Objective:   Physical Exam Vitals and nursing note reviewed.  Constitutional:      General: She is not in acute distress.    Appearance: Normal appearance. She is not ill-appearing.  HENT:     Head: Normocephalic and atraumatic.     Right Ear: Tympanic membrane, ear canal and external ear normal. There is no impacted cerumen.     Left Ear: Tympanic membrane, ear canal and external ear normal. There is no impacted cerumen.     Nose: Nose normal. No congestion or rhinorrhea.     Mouth/Throat:     Mouth: Mucous membranes are moist.     Pharynx: Oropharynx is clear.  Eyes:     Extraocular Movements: Extraocular movements intact.     Conjunctiva/sclera: Conjunctivae normal.     Pupils: Pupils are equal, round, and reactive to light.  Neck:     Vascular: No carotid bruit.  Cardiovascular:     Rate and  Rhythm: Normal rate and regular rhythm.     Pulses: Normal pulses.     Heart sounds: No murmur heard.    No friction rub. No gallop.  Pulmonary:     Effort: Pulmonary effort is normal.     Breath sounds: Normal breath sounds.  Abdominal:     General: Abdomen is flat. Bowel sounds are normal. There is no distension.     Palpations: Abdomen is soft. There is no mass.     Tenderness: There is no abdominal tenderness. There is no guarding or rebound.     Hernia: No hernia is present.  Musculoskeletal:        General: Normal range of motion.     Cervical back: Normal range of motion and neck supple.  Lymphadenopathy:     Cervical: No cervical adenopathy.  Skin:    General: Skin is warm and dry.     Capillary Refill: Capillary refill takes less than 2 seconds.  Neurological:     General: No focal deficit present.     Mental Status: She is alert and oriented to person, place, and time.  Psychiatric:        Mood and Affect: Mood normal.        Behavior: Behavior normal.        Thought Content: Thought content normal.        Judgment: Judgment normal.        Assessment & Plan:  1. Routine general medical examination at a health care facility Today patient counseled on age appropriate routine  health concerns for screening and prevention, each reviewed and up to date or declined. Immunizations reviewed and up to date or declined. Labs ordered and reviewed. Risk factors for depression reviewed and negative. Hearing function and visual acuity are intact. ADLs screened and addressed as needed. Functional ability and level of safety reviewed and appropriate. Education, counseling and referrals performed based on assessed risks today. Patient provided with a copy of personalized plan for preventive services. - Flu shot given  - Follow up in one year or sooner if needed  2. Anxiety and depression - Continue with Celexa daily  and Ativan PRN  - VITAMIN D 25 Hydroxy (Vit-D Deficiency,  Fractures); Future - CBC with Differential/Platelet; Future - Comprehensive metabolic panel; Future - Lipid panel; Future - TSH; Future - LORazepam (ATIVAN) 0.5 MG tablet; take 1 tablet by mouth twice a day if needed  Dispense: 30 tablet; Refill: 2  3. Hyperlipidemia, unspecified hyperlipidemia type - Continue with statin  - VITAMIN D 25 Hydroxy (Vit-D Deficiency, Fractures); Future - CBC with Differential/Platelet; Future - Comprehensive metabolic panel; Future - Lipid panel; Future - TSH; Future - simvastatin (ZOCOR) 40 MG tablet; TAKE 1 TABLET(40 MG) BY MOUTH AT BEDTIME  Dispense: 90 tablet; Refill: 3  4. Vitamin D deficiency  - VITAMIN D 25 Hydroxy (Vit-D Deficiency, Fractures); Future  5. Need for influenza vaccination  - Flu Vaccine Trivalent High Dose (Fluad)  6. Preoperative clearance - Class 1 risk with a 3.9 % 30 days risk  - CBC with Differential/Platelet; Future - Comprehensive metabolic panel; Future - EKG 12-Lead EKG: normal EKG, normal sinus rhythm, unchanged from previous tracings. Rate 65   7. Other allergic rhinitis  - fluticasone (FLONASE) 50 MCG/ACT nasal spray; INHALE 2 SPRAYS INTO BOTH NOSTRILS ONCE DAILY  Dispense: 16 g; Refill: 6  Shirline Frees, NP

## 2023-11-08 NOTE — Patient Instructions (Addendum)
It was great seeing you today   We will follow up with you regarding your lab work   Please let me know if you need anything   

## 2023-11-19 ENCOUNTER — Encounter: Payer: Self-pay | Admitting: Orthopaedic Surgery

## 2023-11-19 NOTE — Telephone Encounter (Signed)
Patient would like to schedule TKA.  Do you need a sheet?  Which knee does she want to do?

## 2023-11-28 ENCOUNTER — Other Ambulatory Visit: Payer: Self-pay | Admitting: Adult Health

## 2023-11-28 DIAGNOSIS — F32A Depression, unspecified: Secondary | ICD-10-CM

## 2023-11-28 DIAGNOSIS — F419 Anxiety disorder, unspecified: Secondary | ICD-10-CM

## 2023-12-14 NOTE — Progress Notes (Signed)
Surgical Instructions   Your procedure is scheduled on Monday December 31, 2023. Report to [facility] Main Entrance "A" at 10:15 A.M., then check in with the Admitting office. Any questions or running late day of surgery: call [phone]  Questions prior to your surgery date: call [phone], Monday-Friday, 8am-4pm. If you experience any cold or flu symptoms such as cough, fever, chills, shortness of breath, etc. between now and your scheduled surgery, please notify us at the above number.     Remember:  Do not eat after midnight the night before your surgery  You may drink clear liquids until 9:15 the morning of your surgery.   Clear liquids allowed are: Water, Non-Citrus Juices (without pulp), Carbonated Beverages, Clear Tea (no milk, honey, etc.), Black Coffee Only (NO MILK, CREAM OR POWDERED CREAMER of any kind), and Gatorade.  Patient Instructions  The night before surgery:  No food after midnight. ONLY clear liquids after midnight  The day of surgery (if you do NOT have diabetes):  Drink ONE (1) Pre-Surgery Clear Ensure by 9:15 the morning of surgery. Drink in one sitting. Do not sip.  This drink was given to you during your hospital  pre-op appointment visit.  Nothing else to drink after completing the  Pre-Surgery Clear Ensure.         If you have questions, please contact your surgeon's office.   Take these medicines the morning of surgery with A SIP OF WATER  citalopram (CELEXA)  RESTASIS 0.05 % ophthalmic emulsion    May take these medicines IF NEEDED: budesonide-formoterol (SYMBICORT)  fluticasone (FLONASE)  LORazepam (ATIVAN)  traMADol (ULTRAM)   One week prior to surgery, STOP taking any Aspirin (unless otherwise instructed by your surgeon) Aleve, Naproxen, Ibuprofen, Motrin, Advil, Goody's, BC's, all herbal medications, fish oil, and non-prescription vitamins.                     Do NOT Smoke (Tobacco/Vaping) for 24 hours prior to your procedure.  If  you use a CPAP at night, you may bring your mask/headgear for your overnight stay.   You will be asked to remove any contacts, glasses, piercing's, hearing aid's, dentures/partials prior to surgery. Please bring cases for these items if needed.    Patients discharged the day of surgery will not be allowed to drive home, and someone needs to stay with them for 24 hours.  SURGICAL WAITING ROOM VISITATION Patients may have no more than 2 support people in the waiting area - these visitors may rotate.   Pre-op nurse will coordinate an appropriate time for 1 ADULT support person, who may not rotate, to accompany patient in pre-op.  Children under the age of 61 must have an adult with them who is not the patient and must remain in the main waiting area with an adult.  If the patient needs to stay at the hospital during part of their recovery, the visitor guidelines for inpatient rooms apply.  Please refer to the [facility] website for the visitor guidelines for any additional information.   If you received a COVID test during your pre-op visit  it is requested that you wear a mask when out in public, stay away from anyone that may not be feeling well and notify your surgeon if you develop symptoms. If you have been in contact with anyone that has tested positive in the last 10 days please notify you surgeon.      Pre-operative 5 CHG Bathing Instructions  You can play a key role in reducing the risk of infection after surgery. Your skin needs to be as free of germs as possible. You can reduce the number of germs on your skin by washing with CHG (chlorhexidine gluconate) soap before surgery. CHG is an antiseptic soap that kills germs and continues to kill germs even after washing.   DO NOT use if you have an allergy to chlorhexidine/CHG or antibacterial soaps. If your skin becomes reddened or irritated, stop using the CHG and notify one of our RNs at [phone].   Please shower with the CHG  soap starting 4 days before surgery using the following schedule:     Please keep in mind the following:  DO NOT shave, including legs and underarms, starting the day of your first shower.   You may shave your face at any point before/day of surgery.  Place clean sheets on your bed the day you start using CHG soap. Use a clean washcloth (not used since being washed) for each shower. DO NOT sleep with pets once you start using the CHG.   CHG Shower Instructions:  Wash your face and private area with normal soap. If you choose to wash your hair, wash first with your normal shampoo.  After you use shampoo/soap, rinse your hair and body thoroughly to remove shampoo/soap residue.  Turn the water OFF and apply about 3 tablespoons (45 ml) of CHG soap to a CLEAN washcloth.  Apply CHG soap ONLY FROM YOUR NECK DOWN TO YOUR TOES (washing for 3-5 minutes)  DO NOT use CHG soap on face, private areas, open wounds, or sores.  Pay special attention to the area where your surgery is being performed.  If you are having back surgery, having someone wash your back for you may be helpful. Wait 2 minutes after CHG soap is applied, then you may rinse off the CHG soap.  Pat dry with a clean towel  Put on clean clothes/pajamas   If you choose to wear lotion, please use ONLY the CHG-compatible lotions on the back of this paper.   Additional instructions for the day of surgery: DO NOT APPLY any lotions, deodorants or perfumes.   Do not bring valuables to the hospital. [facility] is not responsible for any belongings/valuables. Do not wear nail polish, gel polish, artificial nails, or any other type of covering on natural nails (fingers and toes) Do not wear jewelry or makeup Put on clean/comfortable clothes.  Please brush your teeth.  Ask your nurse before applying any prescription medications to the skin.     CHG Compatible Lotions   Aveeno Moisturizing lotion  Cetaphil Moisturizing Cream  Cetaphil  Moisturizing Lotion  Clairol Herbal Essence Moisturizing Lotion, Dry Skin  Clairol Herbal Essence Moisturizing Lotion, Extra Dry Skin  Clairol Herbal Essence Moisturizing Lotion, Normal Skin  Curel Age Defying Therapeutic Moisturizing Lotion with Alpha Hydroxy  Curel Extreme Care Body Lotion  Curel Soothing Hands Moisturizing Hand Lotion  Curel Therapeutic Moisturizing Cream, Fragrance-Free  Curel Therapeutic Moisturizing Lotion, Fragrance-Free  Curel Therapeutic Moisturizing Lotion, Original Formula  Eucerin Daily Replenishing Lotion  Eucerin Dry Skin Therapy Plus Alpha Hydroxy Crme  Eucerin Dry Skin Therapy Plus Alpha Hydroxy Lotion  Eucerin Original Crme  Eucerin Original Lotion  Eucerin Plus Crme Eucerin Plus Lotion  Eucerin TriLipid Replenishing Lotion  Keri Anti-Bacterial Hand Lotion  Keri Deep Conditioning Original Lotion Dry Skin Formula Softly Scented  Keri Deep Conditioning Original Lotion, Fragrance Free Sensitive Skin Formula  Keri Lotion  Fast Absorbing Fragrance Free Sensitive Skin Formula  Keri Lotion Fast Absorbing Softly Scented Dry Skin Formula  Keri Original Lotion  Keri Skin Renewal Lotion Keri Silky Smooth Lotion  Keri Silky Smooth Sensitive Skin Lotion  Nivea Body Creamy Conditioning Oil  Nivea Body Extra Enriched Lotion  Nivea Body Original Lotion  Nivea Body Sheer Moisturizing Lotion Nivea Crme  Nivea Skin Firming Lotion  NutraDerm 30 Skin Lotion  NutraDerm Skin Lotion  NutraDerm Therapeutic Skin Cream  NutraDerm Therapeutic Skin Lotion  ProShield Protective Hand Cream  Provon moisturizing lotion  Please read over the following fact sheets that you were given.

## 2023-12-17 ENCOUNTER — Encounter (HOSPITAL_COMMUNITY)
Admission: RE | Admit: 2023-12-17 | Discharge: 2023-12-17 | Disposition: A | Discharge status: Home / Self Care | Payer: Medicare PPO | Source: Ambulatory Visit | Attending: Orthopaedic Surgery | Admitting: Orthopaedic Surgery

## 2023-12-17 ENCOUNTER — Other Ambulatory Visit: Payer: Self-pay

## 2023-12-17 ENCOUNTER — Encounter (HOSPITAL_COMMUNITY): Payer: Self-pay

## 2023-12-17 VITALS — BP 143/77 | HR 70 | Temp 98.0°F | Resp 18 | Ht 63.0 in | Wt 169.8 lb

## 2023-12-17 DIAGNOSIS — Z01812 Encounter for preprocedural laboratory examination: Secondary | ICD-10-CM | POA: Diagnosis not present

## 2023-12-17 DIAGNOSIS — M25561 Pain in right knee: Secondary | ICD-10-CM | POA: Diagnosis not present

## 2023-12-17 DIAGNOSIS — E785 Hyperlipidemia, unspecified: Secondary | ICD-10-CM | POA: Diagnosis not present

## 2023-12-17 DIAGNOSIS — Z87891 Personal history of nicotine dependence: Secondary | ICD-10-CM | POA: Insufficient documentation

## 2023-12-17 DIAGNOSIS — I8391 Asymptomatic varicose veins of right lower extremity: Secondary | ICD-10-CM | POA: Diagnosis not present

## 2023-12-17 DIAGNOSIS — M1711 Unilateral primary osteoarthritis, right knee: Secondary | ICD-10-CM | POA: Diagnosis not present

## 2023-12-17 DIAGNOSIS — Z01818 Encounter for other preprocedural examination: Secondary | ICD-10-CM

## 2023-12-17 HISTORY — DX: Dyspnea, unspecified: R06.00

## 2023-12-17 HISTORY — DX: Failed or difficult intubation, initial encounter: T88.4XXA

## 2023-12-17 HISTORY — DX: Asymptomatic varicose veins of unspecified lower extremity: I83.90

## 2023-12-17 HISTORY — DX: Unilateral primary osteoarthritis, right knee: M17.11

## 2023-12-17 LAB — BASIC METABOLIC PANEL
Anion gap: 9 (ref 5–15)
BUN: 8 mg/dL (ref 8–23)
CO2: 26 mmol/L (ref 22–32)
Calcium: 9.2 mg/dL (ref 8.9–10.3)
Chloride: 100 mmol/L (ref 98–111)
Creatinine, Ser: 0.58 mg/dL (ref 0.44–1.00)
GFR, Estimated: >60 mL/min (ref >60)
Glucose, Bld: 86 mg/dL (ref 70–99)
Potassium: 3.9 mmol/L (ref 3.5–5.1)
Sodium: 135 mmol/L (ref 135–145)

## 2023-12-17 LAB — CBC
HCT: 42 % (ref 36.0–46.0)
Hemoglobin: 13 g/dL (ref 12.0–15.0)
MCH: 26.4 pg (ref 26.0–34.0)
MCHC: 31 g/dL (ref 30.0–36.0)
MCV: 85.4 fL (ref 80.0–100.0)
Platelets: 263 10*3/uL (ref 150–400)
RBC: 4.92 MIL/uL (ref 3.87–5.11)
RDW: 14.8 % (ref 11.5–15.5)
WBC: 5.5 10*3/uL (ref 4.0–10.5)
nRBC: 0 % (ref 0.0–0.2)

## 2023-12-17 LAB — SURGICAL PCR SCREEN
MRSA, PCR: NEGATIVE
Staphylococcus aureus: NEGATIVE

## 2023-12-17 NOTE — Progress Notes (Addendum)
PCP - Shirline Frees, NP Cardiologist - denies  PPM/ICD - denies   Chest x-ray - 06/21/22 EKG - 11/27/23 Stress Test - 11/26/17 ECHO - 11/26/17 Cardiac Cath - denies  Sleep Study - denies   DM- denies  Last dose of GLP1 agonist-  n/a   ASA/Blood Thinner Instructions: n/a   ERAS Protcol - yes PRE-SURGERY Ensure given at PAT  COVID TEST- n/a   Anesthesia review: yes, pt states she was told there was some difficulty with intubation r/t her hysteroscopy procedure. She was told "it was not straight." Revonda Standard evaluated pt in PAT  Patient denies shortness of breath, fever, cough and chest pain at PAT appointment   All instructions explained to the patient, with a verbal understanding of the material. Patient agrees to go over the instructions while at home for a better understanding.  The opportunity to ask questions was provided.

## 2023-12-17 NOTE — Anesthesia Preprocedure Evaluation (Signed)
Anesthesia Evaluation  Patient identified by MRN, date of birth, ID band Patient awake    Reviewed: Allergy & Precautions, NPO status , Patient's Chart, lab work & pertinent test results  Airway Mallampati: I  TM Distance: >3 FB Neck ROM: Limited    Dental  (+) Dental Advisory Given, Teeth Intact   Pulmonary shortness of breath, asthma , COPD, former smoker   Pulmonary exam normal breath sounds clear to auscultation       Cardiovascular negative cardio ROS Normal cardiovascular exam Rhythm:Regular Rate:Normal     Neuro/Psych  PSYCHIATRIC DISORDERS Anxiety Depression       GI/Hepatic Neg liver ROS, hiatal hernia,GERD  Medicated and Controlled,,  Endo/Other  negative endocrine ROS    Renal/GU negative Renal ROS     Musculoskeletal  (+) Arthritis ,  Fibromyalgia -  Abdominal  (+) + obese  Peds  Hematology negative hematology ROS (+)   Anesthesia Other Findings   Reproductive/Obstetrics                             Anesthesia Physical Anesthesia Plan  ASA: 3  Anesthesia Plan: General   Post-op Pain Management: Tylenol PO (pre-op)* and Gabapentin PO (pre-op)*   Induction: Intravenous  PONV Risk Score and Plan: 3 and Treatment may vary due to age or medical condition, Ondansetron and Dexamethasone  Airway Management Planned: Oral ETT and Video Laryngoscope Planned  Additional Equipment: Arterial line  Intra-op Plan:   Post-operative Plan: Extubation in OR  Informed Consent: I have reviewed the patients History and Physical, chart, labs and discussed the procedure including the risks, benefits and alternatives for the proposed anesthesia with the patient or authorized representative who has indicated his/her understanding and acceptance.     Dental advisory given  Plan Discussed with: CRNA  Anesthesia Plan Comments: (2x PIV  PAT note written by Shonna Chock, PA-C.  )        Anesthesia Quick Evaluation

## 2023-12-17 NOTE — Progress Notes (Signed)
Anesthesia PAT APP Evaluation:  Case: [id number] Date/Time: 12/31/23 1159   Procedure: RIGHT TOTAL KNEE ARTHROPLASTY (Right: Knee)   Anesthesia type: Spinal   Pre-op diagnosis: right knee osteoarthritis   Location: [facility] ROOM 06 / [facility]   Surgeons: Tarry Kos, MD       DISCUSSION: Patient is a 72 year old female scheduled for the above procedure.  History includes former smoker (quit 04/21/23), DIFFICULT INTUBATION (09/19/05), HLD, osteoarthritis, dyspnea (in setting of allergies, no associated with activity), varicose veins (RLE).   She reported being told she had a difficult airway for hysteroscopy D&C with removal of endometrial polyp on 09/19/05 (because airway was "not straight").  This was done through [facility], so record request made from HIM. She said she had subsequent knee arthroscopy > 10 years ago at the [facility] without issues. She says her dentist told her she has a small mouth. Oral opening 3 finger breadths. Mallampati III.  Denied dysphagia.   Case is posted for spinal anesthesia. She denied known valvular disease, no CAD, CHF, or arrhythmia. She denied chest pain, orthopnea, edema, syncope. She denied prior back surgery. No murmur heart on exam. Heart RRR. Lung clear. No ankle edema noted. No carotid bruits noted. She has had right knee pain for nearly 20 years, but more recently progressed to the extent that it is affecting her sleep.  She has still been able to clean her house without CV symptoms.  Discussed that case will likely be done under spinal anesthesia, although definitive plan following assigned anesthesiologist evaluation. If 2006 anesthesia records received, then will plan to update my note, otherwise, she will likely need advanced air equipment available if GETA is planned. (UPDATE 12/20/23 10:11 AM: 09/19/05 records from [facility] received but only included operative note.)  She had preoperative medical evaluation by Shirline Frees, NP  on 11/08/23 with labs and EKG. EKG showed NSR. He classified, "- Class 1 risk with a 3.9 % 30 days risk."    VS: BP (!) 143/77   Pulse 70   Temp 36.7 C (Oral)   Resp 18   Ht 5\' 3"  (1.6 m)   Wt 77 kg   SpO2 100%   BMI 30.08 kg/m    PROVIDERS: Shirline Frees, NP is PCP    LABS: Labs reviewed: Acceptable for surgery. (all labs ordered are listed, but only abnormal results are displayed)  Labs Reviewed  SURGICAL PCR SCREEN  CBC  BASIC METABOLIC PANEL    IMAGES: Xray right knee 08/30/23: Radiographs of her right knee demonstrate tricompartmental arthritis with  joint space narrowing periarticular osteophytes    EKG: 11/08/23: NSR   CV: [name] Abd Aorta 10/30/19: Summary:  Abdominal Aorta: No evidence of an abdominal aortic aneurysm was visualized. The largest aortic measurement is 2.1 cm.   Stress Echo 11/27/17: Baseline:  - LV size was normal.  - LV global systolic function was normal.  - Normal wall motion; no LV regional wall motion abnormalities.   Immediate post stress:  - LV global systolic function was hyperdynamic.  - No evidence for new LV regional wall motion abnormalities.   Impressions:  - Normal study after maximal exercise.    Past Medical History:  Diagnosis Date   Allergy    Anxiety    Arthritis    Depression    Difficult intubation    pt states she was told it was hard to intubate her due to "it not being straight." This was only  told to her during her hysteroscopy   Dyspnea    pt states this is usually due to weather, and she may have to use her inhaler (seasonal allergies)   Family history of breast cancer    Hyperlipidemia    Osteoarthritis of right knee    Varicose vein of leg    RLE    Past Surgical History:  Procedure Laterality Date   GUM SURGERY     HYSTEROSCOPY     KNEE SURGERY Right     MEDICATIONS:  budesonide-formoterol (SYMBICORT) 160-4.5 MCG/ACT inhaler   Calcium Carbonate-Vitamin D (CALCIUM-VITAMIN D PO)    citalopram (CELEXA) 40 MG tablet   cyclobenzaprine (FLEXERIL) 10 MG tablet   fluticasone (FLONASE) 50 MCG/ACT nasal spray   LORazepam (ATIVAN) 0.5 MG tablet   Multiple Vitamin (MULTIVITAMIN) tablet   RESTASIS 0.05 % ophthalmic emulsion   simvastatin (ZOCOR) 40 MG tablet   traMADol (ULTRAM) 50 MG tablet   vitamin C (ASCORBIC ACID) 250 MG tablet   vitamin E 180 MG (400 UNITS) capsule   No current facility-administered medications for this encounter.    Shonna Chock, PA-C Surgical Short Stay/Anesthesiology [facility] Phone [phone] [facility] Phone [phone] 12/17/2023 2:21 PM

## 2023-12-24 ENCOUNTER — Other Ambulatory Visit: Payer: Self-pay | Admitting: Physician Assistant

## 2023-12-24 MED ORDER — OXYCODONE HCL 5 MG PO TABS
5.0000 mg | ORAL_TABLET | Freq: Three times a day (TID) | ORAL | 0 refills | Status: DC | PRN
Start: 2023-12-24 — End: 2024-10-22

## 2023-12-24 MED ORDER — ONDANSETRON HCL 4 MG PO TABS: 4.0000 mg | ORAL_TABLET | Freq: Three times a day (TID) | ORAL | 0 refills | Status: DC | PRN

## 2023-12-24 MED ORDER — DOCUSATE SODIUM 100 MG PO CAPS: 100.0000 mg | ORAL_CAPSULE | Freq: Every day | ORAL | 2 refills | Status: DC | PRN

## 2023-12-24 MED ORDER — RIVAROXABAN 10 MG PO TABS: 10.0000 mg | ORAL_TABLET | Freq: Every day | ORAL | 0 refills | Status: DC

## 2023-12-24 MED ORDER — METHOCARBAMOL 750 MG PO TABS: 750.0000 mg | ORAL_TABLET | Freq: Two times a day (BID) | ORAL | 2 refills | Status: DC | PRN

## 2023-12-28 ENCOUNTER — Telehealth: Payer: Self-pay

## 2023-12-28 ENCOUNTER — Telehealth: Payer: Self-pay | Admitting: *Deleted

## 2023-12-28 MED ORDER — TRANEXAMIC ACID 1000 MG/10ML IV SOLN
2000.0000 mg | INTRAVENOUS | Status: AC
  Administered 2023-12-31: 1000 mg via TOPICAL
  Filled 2023-12-28: qty 20

## 2023-12-28 NOTE — Telephone Encounter (Signed)
 Attempted call to patient to discuss her upcoming surgery. No answer and left VM requesting call back.

## 2023-12-28 NOTE — Telephone Encounter (Signed)
 Tried to call patient to schedule first post operative visit with Mardella Layman on 01/15/2024. No answer. LMOM for patient to call back for scheduling.

## 2023-12-31 ENCOUNTER — Observation Stay (HOSPITAL_COMMUNITY)
Admission: RE | Admit: 2023-12-31 | Discharge: 2024-01-01 | Disposition: A | Discharge status: Home / Self Care | Payer: Medicare PPO | Attending: Orthopaedic Surgery | Admitting: Orthopaedic Surgery

## 2023-12-31 ENCOUNTER — Ambulatory Visit (HOSPITAL_BASED_OUTPATIENT_CLINIC_OR_DEPARTMENT_OTHER): Payer: Medicare PPO | Admitting: Anesthesiology

## 2023-12-31 ENCOUNTER — Ambulatory Visit (HOSPITAL_COMMUNITY): Payer: Medicare PPO | Admitting: Vascular Surgery

## 2023-12-31 ENCOUNTER — Encounter (HOSPITAL_COMMUNITY): Payer: Self-pay | Admitting: Orthopaedic Surgery

## 2023-12-31 ENCOUNTER — Observation Stay (HOSPITAL_COMMUNITY): Payer: Medicare PPO

## 2023-12-31 ENCOUNTER — Other Ambulatory Visit: Payer: Self-pay

## 2023-12-31 ENCOUNTER — Encounter (HOSPITAL_COMMUNITY)
Admission: RE | Disposition: A | Discharge status: Home / Self Care | Payer: Self-pay | Source: Home / Self Care | Attending: Orthopaedic Surgery

## 2023-12-31 DIAGNOSIS — M25461 Effusion, right knee: Secondary | ICD-10-CM | POA: Diagnosis not present

## 2023-12-31 DIAGNOSIS — Z87891 Personal history of nicotine dependence: Secondary | ICD-10-CM | POA: Diagnosis not present

## 2023-12-31 DIAGNOSIS — G8918 Other acute postprocedural pain: Secondary | ICD-10-CM | POA: Diagnosis not present

## 2023-12-31 DIAGNOSIS — Z471 Aftercare following joint replacement surgery: Secondary | ICD-10-CM | POA: Diagnosis not present

## 2023-12-31 DIAGNOSIS — M179 Osteoarthritis of knee, unspecified: Secondary | ICD-10-CM | POA: Diagnosis present

## 2023-12-31 DIAGNOSIS — M1711 Unilateral primary osteoarthritis, right knee: Secondary | ICD-10-CM | POA: Diagnosis not present

## 2023-12-31 DIAGNOSIS — Z96651 Presence of right artificial knee joint: Secondary | ICD-10-CM | POA: Diagnosis not present

## 2023-12-31 HISTORY — PX: TOTAL KNEE ARTHROPLASTY: SHX125

## 2023-12-31 SURGERY — ARTHROPLASTY, KNEE, TOTAL
Anesthesia: Regional | Site: Knee | Laterality: Right

## 2023-12-31 MED ORDER — FENTANYL CITRATE (PF) 100 MCG/2ML IJ SOLN
25.0000 ug | INTRAMUSCULAR | Status: DC | PRN
  Administered 2023-12-31 (×2): 50 ug via INTRAVENOUS

## 2023-12-31 MED ORDER — DOCUSATE SODIUM 100 MG PO CAPS
100.0000 mg | ORAL_CAPSULE | Freq: Two times a day (BID) | ORAL | Status: DC
  Administered 2023-12-31 – 2024-01-01 (×3): 100 mg via ORAL
  Filled 2023-12-31 (×3): qty 1

## 2023-12-31 MED ORDER — FERROUS SULFATE 325 (65 FE) MG PO TABS
325.0000 mg | ORAL_TABLET | Freq: Three times a day (TID) | ORAL | Status: DC
  Administered 2023-12-31 – 2024-01-01 (×2): 325 mg via ORAL
  Filled 2023-12-31 (×2): qty 1

## 2023-12-31 MED ORDER — POVIDONE-IODINE 10 % EX SWAB
2.0000 | Freq: Once | CUTANEOUS | Status: AC
  Administered 2023-12-31: 2 via TOPICAL

## 2023-12-31 MED ORDER — 0.9 % SODIUM CHLORIDE (POUR BTL) OPTIME
TOPICAL | Status: DC | PRN
  Administered 2023-12-31: 1000 mL

## 2023-12-31 MED ORDER — CITALOPRAM HYDROBROMIDE 20 MG PO TABS
40.0000 mg | ORAL_TABLET | Freq: Every day | ORAL | Status: DC
  Administered 2023-12-31 – 2024-01-01 (×2): 40 mg via ORAL
  Filled 2023-12-31 (×2): qty 2

## 2023-12-31 MED ORDER — METOCLOPRAMIDE HCL 5 MG/ML IJ SOLN
5.0000 mg | Freq: Three times a day (TID) | INTRAMUSCULAR | Status: DC | PRN
Start: 2023-12-31 — End: 2024-01-01

## 2023-12-31 MED ORDER — MIDAZOLAM HCL 2 MG/2ML IJ SOLN
INTRAMUSCULAR | Status: AC
  Filled 2023-12-31: qty 2

## 2023-12-31 MED ORDER — OXYCODONE HCL 5 MG PO TABS
5.0000 mg | ORAL_TABLET | ORAL | Status: DC | PRN
  Filled 2023-12-31: qty 2

## 2023-12-31 MED ORDER — DEXAMETHASONE SODIUM PHOSPHATE 10 MG/ML IJ SOLN
INTRAMUSCULAR | Status: DC | PRN
Start: 2023-12-31 — End: 2023-12-31
  Administered 2023-12-31: 10 mg

## 2023-12-31 MED ORDER — BUPIVACAINE IN DEXTROSE 0.75-8.25 % IT SOLN
INTRATHECAL | Status: DC | PRN
  Administered 2023-12-31: 1.6 mL via INTRATHECAL

## 2023-12-31 MED ORDER — HYDROMORPHONE HCL 1 MG/ML IJ SOLN: 0.5000 mg | INTRAMUSCULAR | Status: DC | PRN

## 2023-12-31 MED ORDER — CHLORHEXIDINE GLUCONATE 0.12 % MT SOLN
OROMUCOSAL | Status: AC
  Administered 2023-12-31: 15 mL
  Filled 2023-12-31: qty 15

## 2023-12-31 MED ORDER — ONDANSETRON HCL 4 MG PO TABS
4.0000 mg | ORAL_TABLET | Freq: Four times a day (QID) | ORAL | Status: DC | PRN
  Administered 2024-01-01: 4 mg via ORAL
  Filled 2023-12-31: qty 1

## 2023-12-31 MED ORDER — EPHEDRINE SULFATE-NACL 50-0.9 MG/10ML-% IV SOSY
PREFILLED_SYRINGE | INTRAVENOUS | Status: DC | PRN
  Administered 2023-12-31: 5 mg via INTRAVENOUS

## 2023-12-31 MED ORDER — PHENOL 1.4 % MT LIQD: 1.0000 | OROMUCOSAL | Status: DC | PRN

## 2023-12-31 MED ORDER — FENTANYL CITRATE (PF) 100 MCG/2ML IJ SOLN: 50.0000 ug | Freq: Once | INTRAMUSCULAR | Status: AC

## 2023-12-31 MED ORDER — ACETAMINOPHEN 500 MG PO TABS
1000.0000 mg | ORAL_TABLET | Freq: Once | ORAL | Status: AC
  Administered 2023-12-31: 1000 mg via ORAL
  Filled 2023-12-31: qty 2

## 2023-12-31 MED ORDER — FENTANYL CITRATE (PF) 250 MCG/5ML IJ SOLN
INTRAMUSCULAR | Status: AC
  Filled 2023-12-31: qty 5

## 2023-12-31 MED ORDER — DEXAMETHASONE SODIUM PHOSPHATE 10 MG/ML IJ SOLN
10.0000 mg | Freq: Once | INTRAMUSCULAR | Status: AC
  Administered 2024-01-01: 10 mg via INTRAVENOUS
  Filled 2023-12-31: qty 1

## 2023-12-31 MED ORDER — METHOCARBAMOL 1000 MG/10ML IJ SOLN
INTRAMUSCULAR | Status: AC
  Filled 2023-12-31: qty 10

## 2023-12-31 MED ORDER — METHOCARBAMOL 500 MG PO TABS
500.0000 mg | ORAL_TABLET | Freq: Four times a day (QID) | ORAL | Status: DC | PRN
  Filled 2023-12-31: qty 1

## 2023-12-31 MED ORDER — RIVAROXABAN 10 MG PO TABS
10.0000 mg | ORAL_TABLET | Freq: Every day | ORAL | Status: DC
  Administered 2024-01-01: 10 mg via ORAL
  Filled 2023-12-31: qty 1

## 2023-12-31 MED ORDER — PROPOFOL 10 MG/ML IV BOLUS
INTRAVENOUS | Status: DC | PRN
  Administered 2023-12-31: 20 mg via INTRAVENOUS
  Administered 2023-12-31: 10 mg via INTRAVENOUS

## 2023-12-31 MED ORDER — CEFAZOLIN SODIUM-DEXTROSE 2-4 GM/100ML-% IV SOLN
2.0000 g | INTRAVENOUS | Status: AC
  Administered 2023-12-31: 2 g via INTRAVENOUS
  Filled 2023-12-31: qty 100

## 2023-12-31 MED ORDER — DEXAMETHASONE SODIUM PHOSPHATE 10 MG/ML IJ SOLN: 10.0000 mg | Freq: Once | INTRAMUSCULAR | Status: DC

## 2023-12-31 MED ORDER — SODIUM CHLORIDE 0.9 % IR SOLN
Status: DC | PRN
  Administered 2023-12-31: 1000 mL

## 2023-12-31 MED ORDER — OXYCODONE HCL 5 MG PO TABS: 10.0000 mg | ORAL_TABLET | ORAL | Status: DC | PRN

## 2023-12-31 MED ORDER — OXYCODONE HCL ER 10 MG PO T12A
10.0000 mg | EXTENDED_RELEASE_TABLET | Freq: Two times a day (BID) | ORAL | Status: DC
  Administered 2023-12-31 – 2024-01-01 (×3): 10 mg via ORAL
  Filled 2023-12-31 (×3): qty 1

## 2023-12-31 MED ORDER — VANCOMYCIN HCL 1000 MG IV SOLR
INTRAVENOUS | Status: DC | PRN
  Administered 2023-12-31: 1000 mg via TOPICAL

## 2023-12-31 MED ORDER — MENTHOL 3 MG MT LOZG: 1.0000 | LOZENGE | OROMUCOSAL | Status: DC | PRN

## 2023-12-31 MED ORDER — DEXAMETHASONE SODIUM PHOSPHATE 10 MG/ML IJ SOLN
INTRAMUSCULAR | Status: DC | PRN
  Administered 2023-12-31: 10 mg via INTRAVENOUS

## 2023-12-31 MED ORDER — ROPIVACAINE HCL 5 MG/ML IJ SOLN
INTRAMUSCULAR | Status: DC | PRN
  Administered 2023-12-31: 20 mL via PERINEURAL

## 2023-12-31 MED ORDER — VANCOMYCIN HCL 1000 MG IV SOLR
INTRAVENOUS | Status: AC
  Filled 2023-12-31: qty 20

## 2023-12-31 MED ORDER — LIDOCAINE 2% (20 MG/ML) 5 ML SYRINGE
INTRAMUSCULAR | Status: DC | PRN
  Administered 2023-12-31: 20 mg via INTRAVENOUS

## 2023-12-31 MED ORDER — METOCLOPRAMIDE HCL 5 MG PO TABS
5.0000 mg | ORAL_TABLET | Freq: Three times a day (TID) | ORAL | Status: DC | PRN
Start: 2023-12-31 — End: 2024-01-01

## 2023-12-31 MED ORDER — PROPOFOL 500 MG/50ML IV EMUL
INTRAVENOUS | Status: DC | PRN
  Administered 2023-12-31: 75 ug/kg/min via INTRAVENOUS

## 2023-12-31 MED ORDER — DEXMEDETOMIDINE HCL IN NACL 80 MCG/20ML IV SOLN
INTRAVENOUS | Status: DC | PRN
  Administered 2023-12-31: 12 ug via INTRAVENOUS

## 2023-12-31 MED ORDER — FENTANYL CITRATE (PF) 100 MCG/2ML IJ SOLN
INTRAMUSCULAR | Status: AC
  Administered 2023-12-31: 50 ug via INTRAVENOUS
  Filled 2023-12-31: qty 2

## 2023-12-31 MED ORDER — TRANEXAMIC ACID-NACL 1000-0.7 MG/100ML-% IV SOLN
1000.0000 mg | Freq: Once | INTRAVENOUS | Status: AC
Start: 2023-12-31 — End: 2023-12-31
  Administered 2023-12-31: 1000 mg via INTRAVENOUS
  Filled 2023-12-31: qty 100

## 2023-12-31 MED ORDER — ONDANSETRON HCL 4 MG/2ML IJ SOLN
4.0000 mg | Freq: Four times a day (QID) | INTRAMUSCULAR | Status: DC | PRN
  Filled 2023-12-31: qty 2

## 2023-12-31 MED ORDER — SENNA 8.6 MG PO TABS
1.0000 | ORAL_TABLET | Freq: Two times a day (BID) | ORAL | Status: DC | PRN
  Administered 2023-12-31: 8.6 mg via ORAL
  Filled 2023-12-31: qty 1

## 2023-12-31 MED ORDER — PRONTOSAN WOUND IRRIGATION OPTIME
TOPICAL | Status: DC | PRN
  Administered 2023-12-31: 350 mL via TOPICAL

## 2023-12-31 MED ORDER — ACETAMINOPHEN 325 MG PO TABS: 325.0000 mg | ORAL_TABLET | Freq: Four times a day (QID) | ORAL | Status: DC | PRN

## 2023-12-31 MED ORDER — PROPOFOL 10 MG/ML IV BOLUS
INTRAVENOUS | Status: AC
Start: 2023-12-31 — End: ?
  Filled 2023-12-31: qty 20

## 2023-12-31 MED ORDER — METHOCARBAMOL 1000 MG/10ML IJ SOLN
500.0000 mg | Freq: Four times a day (QID) | INTRAMUSCULAR | Status: DC | PRN
  Administered 2023-12-31: 500 mg via INTRAVENOUS

## 2023-12-31 MED ORDER — ONDANSETRON HCL 4 MG/2ML IJ SOLN
INTRAMUSCULAR | Status: DC | PRN
  Administered 2023-12-31: 4 mg via INTRAVENOUS

## 2023-12-31 MED ORDER — PROPOFOL 10 MG/ML IV BOLUS
INTRAVENOUS | Status: AC
  Filled 2023-12-31: qty 20

## 2023-12-31 MED ORDER — FENTANYL CITRATE (PF) 100 MCG/2ML IJ SOLN
INTRAMUSCULAR | Status: AC
  Filled 2023-12-31: qty 2

## 2023-12-31 MED ORDER — TRANEXAMIC ACID-NACL 1000-0.7 MG/100ML-% IV SOLN
1000.0000 mg | INTRAVENOUS | Status: AC
  Filled 2023-12-31: qty 100

## 2023-12-31 MED ORDER — BUPIVACAINE-MELOXICAM ER 400-12 MG/14ML IJ SOLN
INTRAMUSCULAR | Status: AC
  Filled 2023-12-31: qty 1

## 2023-12-31 MED ORDER — FENTANYL CITRATE (PF) 250 MCG/5ML IJ SOLN
INTRAMUSCULAR | Status: DC | PRN
  Administered 2023-12-31 (×2): 50 ug via INTRAVENOUS

## 2023-12-31 MED ORDER — PHENYLEPHRINE HCL-NACL 20-0.9 MG/250ML-% IV SOLN
INTRAVENOUS | Status: DC | PRN
  Administered 2023-12-31: 35 ug/min via INTRAVENOUS

## 2023-12-31 MED ORDER — ACETAMINOPHEN 500 MG PO TABS
1000.0000 mg | ORAL_TABLET | Freq: Four times a day (QID) | ORAL | Status: AC
  Administered 2023-12-31 – 2024-01-01 (×4): 1000 mg via ORAL
  Filled 2023-12-31 (×4): qty 2

## 2023-12-31 MED ORDER — LORAZEPAM 0.5 MG PO TABS
0.5000 mg | ORAL_TABLET | Freq: Two times a day (BID) | ORAL | Status: DC | PRN
  Administered 2024-01-01: 0.5 mg via ORAL
  Filled 2023-12-31: qty 1

## 2023-12-31 MED ORDER — TRANEXAMIC ACID 1000 MG/10ML IV SOLN
INTRAVENOUS | Status: DC | PRN
  Administered 2023-12-31: 2000 mg via TOPICAL

## 2023-12-31 MED ORDER — LACTATED RINGERS IV SOLN: INTRAVENOUS | Status: DC | PRN

## 2023-12-31 MED ORDER — BUPIVACAINE-MELOXICAM ER 400-12 MG/14ML IJ SOLN
INTRAMUSCULAR | Status: DC | PRN
  Administered 2023-12-31: 400 mg

## 2023-12-31 MED ORDER — CEFAZOLIN SODIUM-DEXTROSE 2-4 GM/100ML-% IV SOLN
2.0000 g | Freq: Four times a day (QID) | INTRAVENOUS | Status: AC
  Administered 2023-12-31 (×2): 2 g via INTRAVENOUS
  Filled 2023-12-31 (×2): qty 100

## 2023-12-31 SURGICAL SUPPLY — 66 items
ALCOHOL 70% 16 OZ (MISCELLANEOUS) ×1 IMPLANT
BAG DECANTER FOR FLEXI CONT (MISCELLANEOUS) ×1 IMPLANT
BLADE SAG 18X100X1.27 (BLADE) ×1 IMPLANT
BLADE SAW SGTL 73X25 THK (BLADE) ×1 IMPLANT
BOWL SMART MIX CTS (DISPOSABLE) ×1 IMPLANT
CEMENT BONE REFOBACIN R1X40 US (Cement) IMPLANT
CLSR STERI-STRIP ANTIMIC 1/2X4 (GAUZE/BANDAGES/DRESSINGS) ×2 IMPLANT
COMP FEM CMT CR PERS SZ8 RT (Joint) ×1 IMPLANT
COMPONENT FEM CMT CR PRS SZ8RT (Joint) IMPLANT
COOLER ICEMAN CLASSIC (MISCELLANEOUS) ×1 IMPLANT
COVER SURGICAL LIGHT HANDLE (MISCELLANEOUS) ×1 IMPLANT
CUFF TRNQT CYL 34X4.125X (TOURNIQUET CUFF) ×1 IMPLANT
DERMABOND ADVANCED .7 DNX12 (GAUZE/BANDAGES/DRESSINGS) ×1 IMPLANT
DRAPE EXTREMITY T 121X128X90 (DISPOSABLE) ×1 IMPLANT
DRAPE HALF SHEET 40X57 (DRAPES) ×1 IMPLANT
DRAPE INCISE IOBAN 66X45 STRL (DRAPES) ×1 IMPLANT
DRAPE POUCH INSTRU U-SHP 10X18 (DRAPES) ×1 IMPLANT
DRAPE U-SHAPE 47X51 STRL (DRAPES) ×2 IMPLANT
DRSG AQUACEL AG ADV 3.5X10 (GAUZE/BANDAGES/DRESSINGS) ×1 IMPLANT
DURAPREP 26ML APPLICATOR (WOUND CARE) ×3 IMPLANT
ELECT CAUTERY BLADE 6.4 (BLADE) ×1 IMPLANT
ELECT PENCIL ROCKER SW 15FT (MISCELLANEOUS) ×1 IMPLANT
ELECT REM PT RETURN 9FT ADLT (ELECTROSURGICAL) ×1 IMPLANT
ELECTRODE REM PT RTRN 9FT ADLT (ELECTROSURGICAL) ×1 IMPLANT
GLOVE BIOGEL PI IND STRL 7.0 (GLOVE) ×2 IMPLANT
GLOVE BIOGEL PI IND STRL 7.5 (GLOVE) ×5 IMPLANT
GLOVE ECLIPSE 7.0 STRL STRAW (GLOVE) ×3 IMPLANT
GLOVE INDICATOR 7.0 STRL GRN (GLOVE) ×1 IMPLANT
GLOVE INDICATOR 7.5 STRL GRN (GLOVE) ×1 IMPLANT
GLOVE SURG SYN 7.5 E (GLOVE) ×2 IMPLANT
GLOVE SURG SYN 7.5 PF PI (GLOVE) ×2 IMPLANT
GLOVE SURG UNDER LTX SZ7.5 (GLOVE) ×2 IMPLANT
GLOVE SURG UNDER POLY LF SZ7 (GLOVE) ×2 IMPLANT
GOWN STRL REUS W/ TWL LRG LVL3 (GOWN DISPOSABLE) ×1 IMPLANT
GOWN STRL SURGICAL XL XLNG (GOWN DISPOSABLE) ×1 IMPLANT
GOWN TOGA ZIPPER T7+ PEEL AWAY (MISCELLANEOUS) ×2 IMPLANT
HOOD PEEL AWAY T7 (MISCELLANEOUS) ×1 IMPLANT
INSERT TIBIA KNEE RIGHT 10 (Joint) IMPLANT
KIT BASIN OR (CUSTOM PROCEDURE TRAY) ×1 IMPLANT
KIT TURNOVER KIT B (KITS) ×1 IMPLANT
MANIFOLD NEPTUNE II (INSTRUMENTS) ×1 IMPLANT
MARKER SKIN DUAL TIP RULER LAB (MISCELLANEOUS) ×2 IMPLANT
NDL SPNL 18GX3.5 QUINCKE PK (NEEDLE) ×1 IMPLANT
NEEDLE SPNL 18GX3.5 QUINCKE PK (NEEDLE) ×1 IMPLANT
NS IRRIG 1000ML POUR BTL (IV SOLUTION) ×1 IMPLANT
PACK TOTAL JOINT (CUSTOM PROCEDURE TRAY) ×1 IMPLANT
PAD ARMBOARD 7.5X6 YLW CONV (MISCELLANEOUS) ×2 IMPLANT
PAD COLD SHLDR WRAP-ON (PAD) ×1 IMPLANT
PIN DRILL HDLS TROCAR 75 4PK (PIN) IMPLANT
SCREW FEMALE HEX FIX 25X2.5 (ORTHOPEDIC DISPOSABLE SUPPLIES) IMPLANT
SET HNDPC FAN SPRY TIP SCT (DISPOSABLE) ×1 IMPLANT
SOLUTION PRONTOSAN WOUND 350ML (IRRIGATION / IRRIGATOR) ×1 IMPLANT
STEM POLY PAT PLY 32M KNEE (Knees) IMPLANT
STEM TIBIA 5 DEG SZ E R KNEE (Knees) IMPLANT
SUCTION TUBE FRAZIER 10FR DISP (SUCTIONS) ×1 IMPLANT
SUCTION TUBE FRAZIER 8FR DISP (SUCTIONS) IMPLANT
SUT VIC AB 0 CT1 27XBRD ANBCTR (SUTURE) ×2 IMPLANT
SUT VIC AB 1 CTX 27 (SUTURE) ×3 IMPLANT
SUT VIC AB 2-0 CT1 TAPERPNT 27 (SUTURE) ×4 IMPLANT
SYR 50ML LL SCALE MARK (SYRINGE) ×2 IMPLANT
TIBIA STEM 5 DEG SZ E R KNEE (Knees) ×1 IMPLANT
TOWEL GREEN STERILE (TOWEL DISPOSABLE) ×1 IMPLANT
TOWEL GREEN STERILE FF (TOWEL DISPOSABLE) ×1 IMPLANT
TUBE SUCT ARGYLE STRL (TUBING) ×1 IMPLANT
UNDERPAD 30X36 HEAVY ABSORB (UNDERPADS AND DIAPERS) ×1 IMPLANT
YANKAUER SUCT BULB TIP NO VENT (SUCTIONS) ×2 IMPLANT

## 2023-12-31 NOTE — Discharge Instructions (Addendum)
 INSTRUCTIONS AFTER JOINT REPLACEMENT   Remove items at home which could result in a fall. This includes throw rugs or furniture in walking pathways ICE to the affected joint every three hours while awake for 30 minutes at a time, for at least the first 3-5 days, and then as needed for pain and swelling.  Continue to use ice for pain and swelling. You may notice swelling that will progress down to the foot and ankle.  This is normal after surgery.  Elevate your leg when you are not up walking on it.   Continue to use the breathing machine you got in the hospital (incentive spirometer) which will help keep your temperature down.  It is common for your temperature to cycle up and down following surgery, especially at night when you are not up moving around and exerting yourself.  The breathing machine keeps your lungs expanded and your temperature down.   DIET:  As you were doing prior to hospitalization, we recommend a well-balanced diet.  DRESSING / WOUND CARE / SHOWERING  Keep the surgical dressing until follow up.  The dressing is water proof, so you can shower without any extra covering.  IF THE DRESSING FALLS OFF or the wound gets wet inside, change the dressing with sterile gauze.  Please use good hand washing techniques before changing the dressing.  Do not use any lotions or creams on the incision until instructed by your surgeon.    ACTIVITY  Increase activity slowly as tolerated, but follow the weight bearing instructions below.   No driving for 6 weeks or until further direction given by your physician.  You cannot drive while taking narcotics.  No lifting or carrying greater than 10 lbs. until further directed by your surgeon. Avoid periods of inactivity such as sitting longer than an hour when not asleep. This helps prevent blood clots.  You may return to work once you are authorized by your doctor.     WEIGHT BEARING   Weight bearing as tolerated with assist device (walker, cane,  etc) as directed, use it as long as suggested by your surgeon or therapist, typically at least 4-6 weeks.   EXERCISES  Results after joint replacement surgery are often greatly improved when you follow the exercise, range of motion and muscle strengthening exercises prescribed by your doctor. Safety measures are also important to protect the joint from further injury. Any time any of these exercises cause you to have increased pain or swelling, decrease what you are doing until you are comfortable again and then slowly increase them. If you have problems or questions, call your caregiver or physical therapist for advice.   Rehabilitation is important following a joint replacement. After just a few days of immobilization, the muscles of the leg can become weakened and shrink (atrophy).  These exercises are designed to build up the tone and strength of the thigh and leg muscles and to improve motion. Often times heat used for twenty to thirty minutes before working out will loosen up your tissues and help with improving the range of motion but do not use heat for the first two weeks following surgery (sometimes heat can increase post-operative swelling).   These exercises can be done on a training (exercise) mat, on the floor, on a table or on a bed. Use whatever works the best and is most comfortable for you.    Use music or television while you are exercising so that the exercises are a pleasant break in your  day. This will make your life better with the exercises acting as a break in your routine that you can look forward to.   Perform all exercises about fifteen times, three times per day or as directed.  You should exercise both the operative leg and the other leg as well.  Exercises include:   Quad Sets - Tighten up the muscle on the front of the thigh (Quad) and hold for 5-10 seconds.   Straight Leg Raises - With your knee straight (if you were given a brace, keep it on), lift the leg to 60  degrees, hold for 3 seconds, and slowly lower the leg.  Perform this exercise against resistance later as your leg gets stronger.  Leg Slides: Lying on your back, slowly slide your foot toward your buttocks, bending your knee up off the floor (only go as far as is comfortable). Then slowly slide your foot back down until your leg is flat on the floor again.  Angel Wings: Lying on your back spread your legs to the side as far apart as you can without causing discomfort.  Hamstring Strength:  Lying on your back, push your heel against the floor with your leg straight by tightening up the muscles of your buttocks.  Repeat, but this time bend your knee to a comfortable angle, and push your heel against the floor.  You may put a pillow under the heel to make it more comfortable if necessary.   A rehabilitation program following joint replacement surgery can speed recovery and prevent re-injury in the future due to weakened muscles. Contact your doctor or a physical therapist for more information on knee rehabilitation.    CONSTIPATION  Constipation is defined medically as fewer than three stools per week and severe constipation as less than one stool per week.  Even if you have a regular bowel pattern at home, your normal regimen is likely to be disrupted due to multiple reasons following surgery.  Combination of anesthesia, postoperative narcotics, change in appetite and fluid intake all can affect your bowels.   YOU MUST use at least one of the following options; they are listed in order of increasing strength to get the job done.  They are all available over the counter, and you may need to use some, POSSIBLY even all of these options:    Drink plenty of fluids (prune juice may be helpful) and high fiber foods Colace 100 mg by mouth twice a day  Senokot for constipation as directed and as needed Dulcolax (bisacodyl), take with full glass of water  Miralax (polyethylene glycol) once or twice a day as  needed.  If you have tried all these things and are unable to have a bowel movement in the first 3-4 days after surgery call either your surgeon or your primary doctor.    If you experience loose stools or diarrhea, hold the medications until you stool forms back up.  If your symptoms do not get better within 1 week or if they get worse, check with your doctor.  If you experience the worst abdominal pain ever or develop nausea or vomiting, please contact the office immediately for further recommendations for treatment.   ITCHING:  If you experience itching with your medications, try taking only a single pain pill, or even half a pain pill at a time.  You can also use Benadryl over the counter for itching or also to help with sleep.   TED HOSE STOCKINGS:  Use stockings on both  legs until for at least 2 weeks or as directed by physician office. They may be removed at night for sleeping.  MEDICATIONS:  See your medication summary on the "After Visit Summary" that nursing will review with you.  You may have some home medications which will be placed on hold until you complete the course of blood thinner medication.  It is important for you to complete the blood thinner medication as prescribed.  PRECAUTIONS:  If you experience chest pain or shortness of breath - call 911 immediately for transfer to the hospital emergency department.   If you develop a fever greater that 101 F, purulent drainage from wound, increased redness or drainage from wound, foul odor from the wound/dressing, or calf pain - CONTACT YOUR SURGEON.                                                   FOLLOW-UP APPOINTMENTS:  If you do not already have a post-op appointment, please call the office for an appointment to be seen by your surgeon.  Guidelines for how soon to be seen are listed in your "After Visit Summary", but are typically between 1-4 weeks after surgery.  OTHER INSTRUCTIONS:   Knee Replacement:  Do not place pillow  under knee, focus on keeping the knee straight while resting. CPM instructions: 0-90 degrees, 2 hours in the morning, 2 hours in the afternoon, and 2 hours in the evening. Place foam block, curve side up under heel at all times except when in CPM or when walking.  DO NOT modify, tear, cut, or change the foam block in any way.  POST-OPERATIVE OPIOID TAPER INSTRUCTIONS: It is important to wean off of your opioid medication as soon as possible. If you do not need pain medication after your surgery it is ok to stop day one. Opioids include: Codeine, Hydrocodone(Norco, Vicodin), Oxycodone (Percocet, oxycontin ) and hydromorphone  amongst others.  Long term and even short term use of opiods can cause: Increased pain response Dependence Constipation Depression Respiratory depression And more.  Withdrawal symptoms can include Flu like symptoms Nausea, vomiting And more Techniques to manage these symptoms Hydrate well Eat regular healthy meals Stay active Use relaxation techniques(deep breathing, meditating, yoga) Do Not substitute Alcohol to help with tapering If you have been on opioids for less than two weeks and do not have pain than it is ok to stop all together.  Plan to wean off of opioids This plan should start within one week post op of your joint replacement. Maintain the same interval or time between taking each dose and first decrease the dose.  Cut the total daily intake of opioids by one tablet each day Next start to increase the time between doses. The last dose that should be eliminated is the evening dose.   Per [facility] clinic policy, our goal is ensure optimal postoperative pain control with a multimodal pain management strategy. For all [facility] patients, our goal is to wean post-operative narcotic medications by 6 weeks post-operatively. If this is not possible due to utilization of pain medication prior to surgery, your [facility] doctor will support your acute  post-operative pain control for the first 6 weeks postoperatively, with a plan to transition you back to your primary pain team following that. [facility] will work to ensure a therapist, occupational.   MAKE SURE YOU:  Understand these  instructions.  Get help right away if you are not doing well or get worse.    Thank you for letting us  be a part of your medical care team.  It is a privilege we respect greatly.  We hope these instructions will help you stay on track for a fast and full recovery!   Information on my medicine - XARELTO  (Rivaroxaban )  This medication education was reviewed with me or my healthcare representative as part of my discharge preparation.   Why was Xarelto  prescribed for you? Xarelto  was prescribed for you to reduce the risk of blood clots forming after orthopedic surgery. The medical term for these abnormal blood clots is venous thromboembolism (VTE).  What do you need to know about xarelto  ? Take your Xarelto  ONCE DAILY at the same time every day. You may take it either with or without food.  If you have difficulty swallowing the tablet whole, you may crush it and mix in applesauce just prior to taking your dose.  Take Xarelto  exactly as prescribed by your doctor and DO NOT stop taking Xarelto  without talking to the doctor who prescribed the medication.  Stopping without other VTE prevention medication to take the place of Xarelto  may increase your risk of developing a clot.  After discharge, you should have regular check-up appointments with your healthcare provider that is prescribing your Xarelto .    What do you do if you miss a dose? If you miss a dose, take it as soon as you remember on the same day then continue your regularly scheduled once daily regimen the next day. Do not take two doses of Xarelto  on the same day.   Important Safety Information A possible side effect of Xarelto  is bleeding. You should call your healthcare provider right away if you  experience any of the following: Bleeding from an injury or your nose that does not stop. Unusual colored urine (red or dark brown) or unusual colored stools (red or black). Unusual bruising for unknown reasons. A serious fall or if you hit your head (even if there is no bleeding).  Some medicines may interact with Xarelto  and might increase your risk of bleeding while on Xarelto . To help avoid this, consult your healthcare provider or pharmacist prior to using any new prescription or non-prescription medications, including herbals, vitamins, non-steroidal anti-inflammatory drugs (NSAIDs) and supplements.  This website has more information on Xarelto : www.xarelto .com.

## 2023-12-31 NOTE — Anesthesia Procedure Notes (Signed)
 Anesthesia Regional Block: Adductor canal block   Pre-Anesthetic Checklist: , timeout performed,  Correct Patient, Correct Site, Correct Laterality,  Correct Procedure, Correct Position, site marked,  Risks and benefits discussed,  Pre-op evaluation,  At surgeon's request and post-op pain management  Laterality: Right  Prep: Maximum Sterile Barrier Precautions used, chloraprep       Needles:  Injection technique: Single-shot  Needle Type: Echogenic Stimulator Needle     Needle Length: 9cm  Needle Gauge: 21     Additional Needles:   Procedures:,,,, ultrasound used (permanent image in chart),,    Narrative:  Start time: 12/31/2023 9:52 AM End time: 12/31/2023 9:55 AM Injection made incrementally with aspirations every 5 mL. Anesthesiologist: Niels Marien CROME, MD

## 2023-12-31 NOTE — H&P (Signed)
 PREOPERATIVE H&P  Chief Complaint: right knee osteoarthritis  HPI: Tamara Hughes is a 73 y.o. female who presents for surgical treatment of right knee osteoarthritis.  She denies any changes in medical history.  Past Surgical History:  Procedure Laterality Date   GUM SURGERY     HYSTEROSCOPY     KNEE SURGERY Right    Social History   Socioeconomic History   Marital status: Single    Spouse name: Not on file   Number of children: 1   Years of education: Not on file   Highest education level: Bachelor's degree (e.g., BA, AB, BS)  Occupational History   Not on file  Tobacco Use   Smoking status: Former    Current packs/day: 0.00    Average packs/day: 1 pack/day for 15.0 years (15.0 ttl pk-yrs)    Types: Cigarettes    Start date: 04/20/1968    Quit date: 04/21/1983    Years since quitting: 40.7   Smokeless tobacco: Never  Vaping Use   Vaping status: Never Used  Substance and Sexual Activity   Alcohol use: Yes    Alcohol/week: 1.0 standard drink of alcohol    Types: 1 Cans of beer per week    Comment: maybe one beer every couple of weeks   Drug use: No   Sexual activity: Not on file  Other Topics Concern   Not on file  Social History Narrative   Not on file   Social Drivers of Health   Financial Resource Strain: Low Risk  (11/01/2023)   Overall Financial Resource Strain (CARDIA)    Difficulty of Paying Living Expenses: Not hard at all  Food Insecurity: No Food Insecurity (11/01/2023)   Hunger Vital Sign    Worried About Running Out of Food in the Last Year: Never true    Ran Out of Food in the Last Year: Never true  Transportation Needs: No Transportation Needs (11/01/2023)   PRAPARE - Administrator, Civil Service (Medical): No    Lack of Transportation (Non-Medical): No  Physical Activity: Insufficiently Active (11/01/2023)   Exercise Vital Sign    Days of Exercise per Week: 1 day    Minutes of Exercise per Session: 30 min  Stress: Stress Concern  Present (11/01/2023)   [employer] of Occupational Health - Occupational Stress Questionnaire    Feeling of Stress : To some extent  Social Connections: Moderately Integrated (11/01/2023)   Social Connection and Isolation Panel [NHANES]    Frequency of Communication with Friends and Family: Three times a week    Frequency of Social Gatherings with Friends and Family: Once a week    Attends Religious Services: More than 4 times per year    Active Member of [employer] or Organizations: Yes    Attends Engineer, Structural: More than 4 times per year    Marital Status: Never married   Family History  Problem Relation Age of Onset   Breast cancer Mother 33   Bone cancer Father 61   Breast cancer Sister 79   Heart attack Sister    Diabetes Sister    Breast cancer Maternal Aunt        dx >50   Cancer Maternal Aunt        NOS   Breast cancer Other        MGF's sister #1   Hyperlipidemia Other    Hypertension Other    Thyroid  disease Other    Stomach cancer Neg  Hx    Rectal cancer Neg Hx    Pancreatic cancer Neg Hx    Colon cancer Neg Hx    Allergies  Allergen Reactions   Ether     Lost all bodily fluids   Indomethacin Other (See Comments)    REACTION: upset stomach   Penicillins Hives   Doxycycline Rash   Prior to Admission medications   Medication Sig Start Date End Date Taking? Authorizing Provider  Calcium Carbonate-Vitamin D  (CALCIUM-VITAMIN D  PO) Take 1 tablet by mouth daily.   Yes [provider]  citalopram  (CELEXA ) 40 MG tablet TAKE 1 TABLET(40 MG) BY MOUTH DAILY 11/28/23  Yes Nafziger, Darleene, NP  cyclobenzaprine  (FLEXERIL ) 10 MG tablet Take 1 tablet (10 mg total) by mouth at bedtime. 05/22/23  Yes Nafziger, Darleene, NP  docusate sodium  (COLACE) 100 MG capsule Take 1 capsule (100 mg total) by mouth daily as needed. 12/24/23 12/23/24  Jule Ronal CROME, PA-C  fluticasone  (FLONASE ) 50 MCG/ACT nasal spray INHALE 2 SPRAYS INTO BOTH NOSTRILS ONCE DAILY Patient  taking differently: Place 2 sprays into both nostrils daily as needed for allergies. 11/08/23  Yes Nafziger, Darleene, NP  LORazepam  (ATIVAN ) 0.5 MG tablet take 1 tablet by mouth twice a day if needed 11/08/23  Yes Nafziger, Darleene, NP  methocarbamol  (ROBAXIN -750) 750 MG tablet Take 1 tablet (750 mg total) by mouth 2 (two) times daily as needed for muscle spasms. 12/24/23   Jule Ronal CROME, PA-C  Multiple Vitamin (MULTIVITAMIN) tablet Take 1 tablet by mouth daily.   Yes [provider]  ondansetron  (ZOFRAN ) 4 MG tablet Take 1 tablet (4 mg total) by mouth every 8 (eight) hours as needed. 12/24/23   Jule Ronal CROME, PA-C  oxyCODONE  (ROXICODONE ) 5 MG immediate release tablet Take 1-2 tablets (5-10 mg total) by mouth every 8 (eight) hours as needed. To be taken after surgery 12/24/23 12/23/24  Jule Ronal CROME, PA-C  RESTASIS 0.05 % ophthalmic emulsion Place 1 drop into both eyes 2 (two) times daily. 08/28/18  Yes [provider]  rivaroxaban  (XARELTO ) 10 MG TABS tablet Take 1 tablet (10 mg total) by mouth daily. To be taken after surgery to prevent blood clots 12/24/23   Jule Ronal CROME, PA-C  simvastatin  (ZOCOR ) 40 MG tablet TAKE 1 TABLET(40 MG) BY MOUTH AT BEDTIME 11/08/23  Yes Nafziger, Darleene, NP  vitamin C (ASCORBIC ACID) 250 MG tablet Take 500 mg by mouth daily.   Yes [provider]  vitamin E 180 MG (400 UNITS) capsule Take 400 Units by mouth daily.   Yes [provider]  budesonide -formoterol  (SYMBICORT ) 160-4.5 MCG/ACT inhaler Inhale 2 puffs into the lungs 2 (two) times daily. Patient taking differently: Inhale 2 puffs into the lungs 2 (two) times daily as needed (shortness of breath). 06/21/22   Webb, Padonda B, FNP  traMADol  (ULTRAM ) 50 MG tablet Take 1 tablet (50 mg total) by mouth every 6 (six) hours as needed. 08/30/23   Persons, Ronal Dragon, PA     Positive ROS: All other systems have been reviewed and were otherwise negative with the exception of those mentioned  in the HPI and as above.  Physical Exam: General: Alert, no acute distress Cardiovascular: No pedal edema Respiratory: No cyanosis, no use of accessory musculature GI: abdomen soft Skin: No lesions in the area of chief complaint Neurologic: Sensation intact distally Psychiatric: Patient is competent for consent with normal mood and affect Lymphatic: no lymphedema  MUSCULOSKELETAL: exam stable  Assessment: right knee osteoarthritis  Plan:  Plan for Procedure(s): RIGHT TOTAL KNEE ARTHROPLASTY  The risks benefits and alternatives were discussed with the patient including but not limited to the risks of nonoperative treatment, versus surgical intervention including infection, bleeding, nerve injury,  blood clots, cardiopulmonary complications, morbidity, mortality, among others, and they were willing to proceed.   Ozell Cummins, MD 12/31/2023 8:59 AM

## 2023-12-31 NOTE — Transfer of Care (Signed)
 Immediate Anesthesia Transfer of Care Note  Patient: Tamara Hughes  Procedure(s) Performed: RIGHT TOTAL KNEE ARTHROPLASTY (Right: Knee)  Patient Location: PACU  Anesthesia Type:MAC  Level of Consciousness: awake, alert , and oriented  Airway & Oxygen Therapy: Patient Spontanous Breathing  Post-op Assessment: Report given to RN  Post vital signs: Reviewed and stable  Last Vitals:  Vitals Value Taken Time  BP 139/75 12/31/23 1252  Temp    Pulse 75 12/31/23 1255  Resp 13 12/31/23 1255  SpO2 100 % 12/31/23 1255  Vitals shown include unfiled device data.  Last Pain:  Vitals:   12/31/23 0858  TempSrc:   PainSc: 0-No pain         Complications: No notable events documented.

## 2023-12-31 NOTE — Care Plan (Signed)
 Ortho Bundle Case Management Note  Patient Details  Name: Tamara Hughes MRN: [id number] Date of Birth: 1951/01/18                  [facility] RNCM call to patient to discuss her upcoming Right total knee arthroplasty with Dr. Jerri on 12/31/23 at [facility]. She is agreeable to case management. She plans to return home with assistance from her son. She has been delivered a CPM and RW from [facility] prior to surgery. Anticipate HHPT will be needed after a short hospital stay. Referral made to [facility] after choice provided. Reviewed all post op care instructions. Will continue to follow for needs.    DME Arranged:   (Received CPM and RW from agency) DME Agency:  [facility]  HH Arranged:  PT HH Agency:  [facility]  Additional Comments: Please contact me with any questions of if this plan should need to change.  Tylene Ned, RN, BSN, [employer]  [phone] 12/31/2023, 10:30 AM

## 2023-12-31 NOTE — Evaluation (Signed)
 Physical Therapy Evaluation Patient Details Name: Tamara Hughes MRN: [id number] DOB: 09/05/1951 Today's Date: 12/31/2023  History of Present Illness  Pt is 73 year old presented to [facility] on  12/31/23 for rt TKR. PMH - arthritis  Clinical Impression  Pt admitted with above diagnosis and presents to PT with functional limitations due to deficits listed below (See PT problem list). Pt needs skilled PT to maximize independence and safety. Should progress quickly with mobility as nausea improves and plans to return home with son.           If plan is discharge home, recommend the following: A little help with walking and/or transfers;A little help with bathing/dressing/bathroom;Assistance with cooking/housework;Help with stairs or ramp for entrance;Assist for transportation   Can travel by private vehicle        Equipment Recommendations None recommended by PT (Will clarify that pt already has one)  Recommendations for Other Services       Functional Status Assessment Patient has had a recent decline in their functional status and demonstrates the ability to make significant improvements in function in a reasonable and predictable amount of time.     Precautions / Restrictions Precautions Precautions: Knee Restrictions Weight Bearing Restrictions Per Provider Order: Yes RLE Weight Bearing Per Provider Order: Weight bearing as tolerated      Mobility  Bed Mobility Overal bed mobility: Needs Assistance Bed Mobility: Supine to Sit     Supine to sit: Min assist     General bed mobility comments: Assist to bring RLE off of bed    Transfers Overall transfer level: Needs assistance Equipment used: Rolling walker (2 wheels) Transfers: Sit to/from Stand Sit to Stand: Min assist           General transfer comment: Assist to power up and stabilize    Ambulation/Gait Ambulation/Gait assistance: Min assist Gait Distance (Feet): 40 Feet Assistive device: Rolling walker (2  wheels) Gait Pattern/deviations: Step-to pattern, Decreased stance time - right, Step-through pattern, Decreased stride length Gait velocity: decr Gait velocity interpretation: <1.31 ft/sec, indicative of household ambulator   General Gait Details: Assist initially for correct walker placement. Verbal cues to not step past wheels of walker. Pt became nauseous with amb  Stairs            Wheelchair Mobility     Tilt Bed    Modified Rankin (Stroke Patients Only)       Balance Overall balance assessment: Mild deficits observed, not formally tested                                           Pertinent Vitals/Pain Pain Assessment Pain Assessment: 0-10 Pain Score: 5  Pain Location: rt knee Pain Descriptors / Indicators: Tightness Pain Intervention(s): Limited activity within patient's tolerance, Monitored during session, Repositioned    Home Living Family/patient expects to be discharged to:: Private residence Living Arrangements: Children Available Help at Discharge: Family;Available 24 hours/day Type of Home: House Home Access: Stairs to enter   Entrance Stairs-Number of Steps: 3-4 Alternate Level Stairs-Number of Steps: flight Home Layout: Two level;Able to live on main level with bedroom/bathroom   Additional Comments: Pt has main level bedroom and bathroom    Prior Function Prior Level of Function : Independent/Modified Independent;Driving                     Extremity/Trunk Assessment  Upper Extremity Assessment Upper Extremity Assessment: Overall WFL for tasks assessed    Lower Extremity Assessment Lower Extremity Assessment: RLE deficits/detail RLE Deficits / Details: Rt knee with fair quad set, AAROM 5-80 degrees       Communication   Communication Communication: Hearing impairment  Cognition Arousal: Alert Behavior During Therapy: WFL for tasks assessed/performed Overall Cognitive Status: Within Functional Limits for  tasks assessed                                          General Comments      Exercises Total Joint Exercises Ankle Circles/Pumps: AROM, Both, 10 reps, Supine Quad Sets: AROM, Right, 5 reps, Supine Heel Slides: AAROM, Right, 5 reps, Supine Knee Flexion: AAROM, Right, 5 reps, Seated Goniometric ROM: 5-80   Assessment/Plan    PT Assessment Patient needs continued PT services  PT Problem List Decreased strength;Decreased range of motion;Decreased mobility;Decreased knowledge of use of DME;Pain       PT Treatment Interventions DME instruction;Gait training;Stair training;Functional mobility training;Therapeutic activities;Therapeutic exercise;Patient/family education    PT Goals (Current goals can be found in the Care Plan section)  Acute Rehab PT Goals Patient Stated Goal: return home PT Goal Formulation: With patient Time For Goal Achievement: 01/07/24 Potential to Achieve Goals: Good    Frequency 7X/week     Co-evaluation               AM-PAC PT 6 Clicks Mobility  Outcome Measure Help needed turning from your back to your side while in a flat bed without using bedrails?: A Little Help needed moving from lying on your back to sitting on the side of a flat bed without using bedrails?: A Little Help needed moving to and from a bed to a chair (including a wheelchair)?: A Little Help needed standing up from a chair using your arms (e.g., wheelchair or bedside chair)?: A Little Help needed to walk in hospital room?: A Little Help needed climbing 3-5 steps with a railing? : A Lot 6 Click Score: 17    End of Session   Activity Tolerance: Other (comment) (limited by nausea) Patient left: in chair;with call bell/phone within reach Nurse Communication: Mobility status;Other (comment) (nausea) PT Visit Diagnosis: Other abnormalities of gait and mobility (R26.89);Pain Pain - Right/Left: Right Pain - part of body: Knee    Time: 8440-8371 PT Time  Calculation (min) (ACUTE ONLY): 29 min   Charges:   PT Evaluation $PT Eval Low Complexity: 1 Low PT Treatments $Gait Training: 8-22 mins PT General Charges $$ ACUTE PT VISIT: 1 Visit         [facility] PT Acute Rehabilitation Services Office [phone]   Rodgers ORN [facility] 12/31/2023, 5:33 PM

## 2023-12-31 NOTE — Op Note (Signed)
 Total Knee Arthroplasty Procedure Note  Preoperative diagnosis: Right knee osteoarthritis  Postoperative diagnosis:same  Operative findings: Complete loss joint space Mild flexion contracture Preop flexion to 95 degrees  Operative procedure: Right total knee arthroplasty. CPT [phone]  Surgeon: N. Ozell Cummins, MD  Assist: Ronal Morna Grave, PA-C; necessary for the timely completion of procedure and due to complexity of procedure.  Anesthesia: Spinal, regional, local  Tourniquet time: see anesthesia record  Implants used: Zimmer persona  Femur: CR 8 narrow Tibia: E Patella: 32 mm Polyethylene: 10 mm medial congruent  Indication: Tamara Hughes is a 73 y.o. year old female with a history of knee pain. Having failed conservative management, the patient elected to proceed with a total knee arthroplasty.  We have reviewed the risk and benefits of the surgery and they elected to proceed after voicing understanding.  Procedure:  After informed consent was obtained and understanding of the risk were voiced including but not limited to bleeding, infection, damage to surrounding structures including nerves and vessels, blood clots, leg length inequality and the failure to achieve desired results, the operative extremity was marked with verbal confirmation of the patient in the holding area.   The patient was then brought to the operating room and transported to the operating room table in the supine position.  A tourniquet was applied to the operative extremity around the upper thigh. The operative limb was then prepped and draped in the usual sterile fashion and preoperative antibiotics were administered.  A time out was performed prior to the start of surgery confirming the correct extremity, preoperative antibiotic administration, as well as team members, implants and instruments available for the case. Correct surgical site was also confirmed with preoperative radiographs. The limb was  then elevated for exsanguination and the tourniquet was inflated. A midline incision was made and a standard medial parapatellar approach was performed.  The infrapatellar fat pad was removed.  Suprapatellar synovium was removed to reveal the anterior distal femoral cortex.  A medial peel was performed to release the capsule and the deep MCL off of the medial tibial plateau back to the semimembranosus.  The patella was then everted which showed complete loss of articular cartilage.  Pre-resection height was 24 mm. The patella was resected down to 13 mm and sized to a 32 mm.  A cover was placed on the patella for protection from retractors.  The knee was then brought into full flexion and we then turned our attention to the femur.  The ACL was sacrificed.  Start site was drilled in the femur and the intramedullary distal femoral cutting guide was placed, set at 5 degrees valgus, taking 12 mm of distal resection for the flexion contracture. The distal cut was made. Osteophytes were then removed.  Next, the proximal tibial cutting guide was placed with appropriate slope, varus/valgus alignment and depth of resection.  The drop rod was attached to confirm that it was aimed at the second metatarsal.  The proximal tibial cut was made taking 4 mm off the low side. Gap blocks were then used to assess the extension gap and alignment, and appropriate soft tissue releases were performed. Attention was turned back to the femur, which was sized using the sizing guide to a size 8 narrow. Appropriate rotation of the femoral component was determined using epicondylar axis, Whiteside's line, and assessing the flexion gap under ligament tension. The appropriate size 4-in-1 cutting block was placed and checked with an angel wing and cuts were made. Posterior  femoral osteophytes and uncapped bone were then removed with the curved osteotome.  The menisci were removed.  Trial components were placed, and stability was checked in full  extension, mid-flexion, and deep flexion to 125 degrees after the PCL was resected to balance the flexion space. Proper tibial rotation was determined and marked.  The patella tracked well without a lateral release.  The femoral lugs were then drilled. Trial components were then removed and tibial preparation performed.  The trial tibia was pointed to the medial third of the tibial tubercle.  The tibia was sized for a size E component.   The bony surfaces were irrigated with a pulse lavage and then dried. Bone cement was vacuum mixed on the back table, and the final components sized above were cemented into place.  Antibiotic irrigation was placed in the knee joint and soft tissues while the cement cured.  After cement had finished curing, excess cement was removed. The stability of the construct was re-evaluated throughout a range of motion and found to be acceptable. The trial liner was removed, the knee was copiously irrigated, and the knee was re-evaluated for any excess bone debris. The real polyethylene liner, 10 mm thick, was inserted and checked to ensure the locking mechanism had engaged appropriately. The tourniquet was deflated and hemostasis was achieved. The wound was irrigated with normal saline.  One gram of vancomycin  powder was placed in the surgical bed.  Topical mixture of 0.25% bupivacaine  and meloxicam  was placed in the joint for postoperative pain.  Capsular closure was performed with a #1 vicryl in flexion, subcutaneous fat closed with a 0 vicryl suture, then subcutaneous tissue closed with interrupted 2.0 vicryl suture. The skin was then closed with a 2.0 nylon and dermabond. A sterile dressing was applied.  The patient was awakened in the operating room and taken to recovery in stable condition. All sponge, needle, and instrument counts were correct at the end of the case.  Morna Grave was necessary for opening, closing, retracting, limb positioning and overall facilitation and  completion of the surgery.  Position: supine  Complications: none.  Time Out: performed   Drains/Packing: none  Estimated blood loss: minimal  Returned to Recovery Room: in good condition.   Antibiotics: yes   Mechanical VTE (DVT) Prophylaxis: sequential compression devices, TED thigh-high  Chemical VTE (DVT) Prophylaxis: xarelto   Fluid Replacement  Crystalloid: see anesthesia record Blood: none  FFP: none   Specimens Removed: 1 to pathology   Sponge and Instrument Count Correct? yes   PACU: portable radiograph - knee AP and Lateral   Plan/RTC: Return in 2 weeks for wound check.   Weight Bearing/Load Lower Extremity: full   Implant Name Type Inv. Item Serial No. Manufacturer Lot No. LRB No. Used Action  CEMENT BONE REFOBACIN [id number] US  - [id number] Cement CEMENT BONE REFOBACIN R1X40 US   ZIMMER RECON(ORTH,TRAU,BIO,SG) [id number] Right 1 Implanted  COMP FEM CMT CR PERS SZ8 RT - [id number] Joint COMP FEM CMT CR PERS SZ8 RT  [employer]ON(ORTH,TRAU,BIO,SG) [id number] Right 1 Implanted  TIBIA STEM 5 DEG SZ E R KNEE - [id number] Knees TIBIA STEM 5 DEG SZ E R KNEE  [employer]ON(ORTH,TRAU,BIO,SG) [id number] Right 1 Implanted  INSERT TIBIA KNEE RIGHT 10 - [id number] Joint INSERT TIBIA KNEE RIGHT 10  [employer]MER [employer]ON(ORTH,TRAU,BIO,SG) [id number] Right 1 Implanted  STEM POLY PAT PLY 23M KNEE - [id number] Knees STEM POLY PAT PLY 23M KNEE  Z[employer]MER RECON(ORTH,TRAU,BIO,SG) [id number] Right 1 Implanted    N. Ozell Cummins, MD [facility]  12:11 PM

## 2024-01-01 ENCOUNTER — Encounter (HOSPITAL_COMMUNITY): Payer: Self-pay | Admitting: Orthopaedic Surgery

## 2024-01-01 DIAGNOSIS — M1711 Unilateral primary osteoarthritis, right knee: Secondary | ICD-10-CM | POA: Diagnosis not present

## 2024-01-01 DIAGNOSIS — Z87891 Personal history of nicotine dependence: Secondary | ICD-10-CM | POA: Diagnosis not present

## 2024-01-01 DIAGNOSIS — Z96651 Presence of right artificial knee joint: Secondary | ICD-10-CM | POA: Diagnosis not present

## 2024-01-01 NOTE — Progress Notes (Signed)
 Patient alert and oriented, voiding adequately, skin clean, dry and intact without evidence of skin break down, or symptoms of complications - no redness or edema noted, only slight tenderness at site.  Patient states pain is manageable at time of discharge. Room was checked and accounted for all patient's belongings; discharge instructions concerning her medications, incision care, follow up appointment and when to call the doctor as needed were all discussed with patient by RN and she expressed understanding on the instructions given.

## 2024-01-01 NOTE — Anesthesia Procedure Notes (Signed)
 Spinal  Patient location during procedure: OR Start time: 12/31/2023 10:35 AM End time: 12/31/2023 10:38 AM Reason for block: surgical anesthesia Staffing Performed: anesthesiologist  Anesthesiologist: Niels Marien CROME, MD Performed by: Niels Marien CROME, MD Authorized by: Niels Marien CROME, MD   Preanesthetic Checklist Completed: patient identified, IV checked, risks and benefits discussed, surgical consent, monitors and equipment checked, pre-op evaluation and timeout performed Spinal Block Patient position: sitting Prep: DuraPrep and site prepped and draped Patient monitoring: cardiac monitor, continuous pulse ox and blood pressure Approach: midline Location: L3-4 Injection technique: single-shot Needle Needle type: Pencan  Needle gauge: 24 G Needle length: 9 cm Assessment Sensory level: T6 Events: CSF return Additional Notes Functioning IV was confirmed and monitors were applied. Sterile prep and drape, including hand hygiene and sterile gloves were used. The patient was positioned and the spine was prepped. The skin was anesthetized with lidocaine .  Free flow of clear CSF was obtained prior to injecting local anesthetic into the CSF.  The spinal needle aspirated freely following injection.  The needle was carefully withdrawn.  The patient tolerated the procedure well.

## 2024-01-01 NOTE — Plan of Care (Signed)
  Problem: Education: Goal: Knowledge of General Education information will improve Description: Including pain rating scale, medication(s)/side effects and non-pharmacologic comfort measures Outcome: Completed/Met   Problem: Health Behavior/Discharge Planning: Goal: Ability to manage health-related needs will improve Outcome: Completed/Met   Problem: Clinical Measurements: Goal: Ability to maintain clinical measurements within normal limits will improve Outcome: Completed/Met Goal: Will remain free from infection Outcome: Completed/Met Goal: Diagnostic test results will improve Outcome: Completed/Met Goal: Respiratory complications will improve Outcome: Completed/Met Goal: Cardiovascular complication will be avoided Outcome: Completed/Met   Problem: Activity: Goal: Risk for activity intolerance will decrease Outcome: Completed/Met   Problem: Nutrition: Goal: Adequate nutrition will be maintained Outcome: Completed/Met   Problem: Coping: Goal: Level of anxiety will decrease Outcome: Completed/Met   Problem: Elimination: Goal: Will not experience complications related to bowel motility Outcome: Completed/Met Goal: Will not experience complications related to urinary retention Outcome: Completed/Met   Problem: Pain Management: Goal: General experience of comfort will improve Outcome: Completed/Met   Problem: Safety: Goal: Ability to remain free from injury will improve Outcome: Completed/Met   Problem: Skin Integrity: Goal: Risk for impaired skin integrity will decrease Outcome: Completed/Met   Problem: Education: Goal: Knowledge of the prescribed therapeutic regimen will improve Outcome: Completed/Met Goal: Individualized Educational Video(s) Outcome: Completed/Met   Problem: Activity: Goal: Ability to avoid complications of mobility impairment will improve Outcome: Completed/Met Goal: Range of joint motion will improve Outcome: Completed/Met   Problem:  Clinical Measurements: Goal: Postoperative complications will be avoided or minimized Outcome: Completed/Met   Problem: Pain Management: Goal: Pain level will decrease with appropriate interventions Outcome: Completed/Met   Problem: Skin Integrity: Goal: Will show signs of wound healing Outcome: Completed/Met

## 2024-01-01 NOTE — Anesthesia Postprocedure Evaluation (Signed)
 Anesthesia Post Note  Patient: Tamara Hughes  Procedure(s) Performed: RIGHT TOTAL KNEE ARTHROPLASTY (Right: Knee)     Patient location during evaluation: PACU Anesthesia Type: Regional and Spinal Level of consciousness: oriented and awake and alert Pain management: pain level controlled Vital Signs Assessment: post-procedure vital signs reviewed and stable Respiratory status: spontaneous breathing, respiratory function stable and patient connected to nasal cannula oxygen Cardiovascular status: blood pressure returned to baseline and stable Postop Assessment: no headache, no backache and no apparent nausea or vomiting Anesthetic complications: no  No notable events documented.  Last Vitals:  Vitals:   01/01/24 0427 01/01/24 0813  BP: 135/68 139/74  Pulse: 63 63  Resp: 20 16  Temp: 36.8 C 37 C  SpO2: 97% 100%    Last Pain:  Vitals:   01/01/24 0813  TempSrc: Oral  PainSc:                  [name]

## 2024-01-01 NOTE — Evaluation (Signed)
 Occupational Therapy Evaluation Patient Details Name: Tamara Hughes MRN: [id number] DOB: 1951/05/31 Today's Date: 01/01/2024   History of Present Illness Pt is 73 year old presented to [facility] on  12/31/23 for rt TKR. PMH - arthritis   Clinical Impression   PTA, pt was mod I and her son lives with her who is able to physically assist as needed s/p surgery. Upon eval, pt with pain, decreased ROM, and strength. Pt benefiting from use of RW for OOB ADL at supervision-CGA level. Educated and demonstrating use of compensatory techniques for LB ADL shower transfers and daily routine. All education provided and pt demonstrates understanding. No further acute OT needs identified. OT to sign off. Thank you for this order. Please re-consult if change in status.       If plan is discharge home, recommend the following: Help with stairs or ramp for entrance;Assist for transportation;Assistance with cooking/housework;A little help with bathing/dressing/bathroom    Functional Status Assessment  Patient has had a recent decline in their functional status and demonstrates the ability to make significant improvements in function in a reasonable and predictable amount of time.  Equipment Recommendations  None recommended by OT    Recommendations for Other Services       Precautions / Restrictions Precautions Precautions: Knee Precaution Booklet Issued:  (in room on arrival) Restrictions Weight Bearing Restrictions Per Provider Order: Yes RLE Weight Bearing Per Provider Order: Weight bearing as tolerated      Mobility Bed Mobility Overal bed mobility: Needs Assistance Bed Mobility: Supine to Sit     Supine to sit: Supervision          Transfers Overall transfer level: Needs assistance Equipment used: Rolling walker (2 wheels) Transfers: Sit to/from Stand Sit to Stand: Supervision           General transfer comment: for safety only      Balance Overall balance assessment: Mild  deficits observed, not formally tested                                         ADL either performed or assessed with clinical judgement   ADL Overall ADL's : Needs assistance/impaired Eating/Feeding: Independent   Grooming: Supervision/safety;Standing   Upper Body Bathing: Modified independent;Sitting   Lower Body Bathing: Supervison/ safety;Sit to/from stand   Upper Body Dressing : Modified independent;Sitting   Lower Body Dressing: Contact guard assist;Sit to/from stand Lower Body Dressing Details (indicate cue type and reason): able to perform CGA but reported her son would likely assist Toilet Transfer: Supervision/safety;Rolling walker (2 wheels);Ambulation;Comfort height toilet   Toileting- Clothing Manipulation and Hygiene: Supervision/safety;Sit to/from stand   Tub/ Shower Transfer: Tub transfer;Supervision/safety;Set up;Ambulation;Tub bench;Rolling walker (2 wheels)   Functional mobility during ADLs: Rolling walker (2 wheels);Supervision/safety (for short distances)       Vision Patient Visual Report: No change from baseline Vision Assessment?: No apparent visual deficits     Perception Perception: Not tested       Praxis Praxis: Not tested       Pertinent Vitals/Pain Pain Assessment Pain Assessment: Faces Faces Pain Scale: Hurts little more Pain Location: rt knee Pain Descriptors / Indicators: Tightness Pain Intervention(s): Limited activity within patient's tolerance, Monitored during session     Extremity/Trunk Assessment Upper Extremity Assessment Upper Extremity Assessment: Overall WFL for tasks assessed   Lower Extremity Assessment Lower Extremity Assessment: Defer to PT evaluation  Communication Communication Communication: Hearing impairment (hearing aides donned in session)   Cognition Arousal: Alert Behavior During Therapy: WFL for tasks assessed/performed Overall Cognitive Status: Within Functional Limits for  tasks assessed                                       General Comments       Exercises     Shoulder Instructions      Home Living Family/patient expects to be discharged to:: Private residence Living Arrangements: Children Available Help at Discharge: Family;Available 24 hours/day Type of Home: House Home Access: Stairs to enter [employer] of Steps: 3-4 Entrance Stairs-Rails: Right Home Layout: Two level;Able to live on main level with bedroom/bathroom Alternate Level Stairs-Number of Steps: flight   Bathroom Shower/Tub: Tub/shower unit   Bathroom Toilet: Handicapped height     Home Equipment: Agricultural Consultant (2 wheels);Rollator (4 wheels);BSC/3in1;Tub bench;Shower seat;Hospital bed (equipment acquired from mother)   Additional Comments: Pt has main level bedroom and bathroom      Prior Functioning/Environment Prior Level of Function : Independent/Modified Independent;Driving                        OT Problem List: Decreased strength;Decreased range of motion;Impaired balance (sitting and/or standing)      OT Treatment/Interventions:      OT Goals(Current goals can be found in the care plan section) Acute Rehab OT Goals Patient Stated Goal: go home OT Goal Formulation: With patient  OT Frequency:      Co-evaluation              AM-PAC OT 6 Clicks Daily Activity     Outcome Measure Help from another person eating meals?: None Help from another person taking care of personal grooming?: A Little Help from another person toileting, which includes using toliet, bedpan, or urinal?: A Little Help from another person bathing (including washing, rinsing, drying)?: A Little Help from another person to put on and taking off regular upper body clothing?: None Help from another person to put on and taking off regular lower body clothing?: A Little 6 Click Score: 20   End of Session Equipment Utilized During Treatment: Rolling  walker (2 wheels) Nurse Communication: Mobility status  Activity Tolerance: Patient tolerated treatment well Patient left: in chair;with call bell/phone within reach  OT Visit Diagnosis: Unsteadiness on feet (R26.81);Muscle weakness (generalized) (M62.81)                Time: 9185-9154 OT Time Calculation (min): 31 min Charges:  OT General Charges $OT Visit: 1 Visit OT Evaluation $OT Eval Low Complexity: 1 Low OT Treatments $Self Care/Home Management : 8-22 mins  Elma JONETTA Lebron FREDERICK, OTR/L [facility] Acute Rehabilitation Office: [phone]   Elma JONETTA Lebron 01/01/2024, 9:21 AM

## 2024-01-01 NOTE — Discharge Summary (Signed)
 Patient ID: Tamara Hughes MRN: [id number] DOB/AGE: 26-Aug-1951 73 y.o.  Admit date: 12/31/2023 Discharge date: 01/01/2024  Admission Diagnoses:  Principal Problem:   Primary osteoarthritis of right knee Active Problems:   Status post total right knee replacement   OA (osteoarthritis) of knee   Discharge Diagnoses:  Same  Past Medical History:  Diagnosis Date   Allergy    Anxiety    Arthritis    Depression    Difficult intubation    pt states she was told it was hard to intubate her due to it not being straight. This was only told to her during her hysteroscopy   Dyspnea    pt states this is usually due to weather, and she may have to use her inhaler (seasonal allergies)   Family history of breast cancer    Hyperlipidemia    Osteoarthritis of right knee    Varicose vein of leg    RLE    Surgeries: Procedure(s): RIGHT TOTAL KNEE ARTHROPLASTY on 12/31/2023   Consultants:   Discharged Condition: Improved  Hospital Course: Tamara Hughes is an 73 y.o. female who was admitted 12/31/2023 for operative treatment ofPrimary osteoarthritis of right knee. Patient has severe unremitting pain that affects sleep, daily activities, and work/hobbies. After pre-op clearance the patient was taken to the operating room on 12/31/2023 and underwent  Procedure(s): RIGHT TOTAL KNEE ARTHROPLASTY.    Patient was given perioperative antibiotics:  Anti-infectives (From admission, onward)    Start     Dose/Rate Route Frequency Ordered Stop   12/31/23 1530  ceFAZolin  (ANCEF ) IVPB 2g/100 mL premix        2 g 200 mL/hr over 30 Minutes Intravenous Every 6 hours 12/31/23 1436 12/31/23 2155   12/31/23 1116  vancomycin  (VANCOCIN ) powder  Status:  Discontinued          As needed 12/31/23 1120 12/31/23 1248   12/31/23 0815  ceFAZolin  (ANCEF ) IVPB 2g/100 mL premix        2 g 200 mL/hr over 30 Minutes Intravenous On call to O.R. 12/31/23 0809 12/31/23 1110        Patient was given sequential compression  devices, early ambulation, and chemoprophylaxis to prevent DVT.  Patient benefited maximally from hospital stay and there were no complications.    Recent vital signs: Patient Vitals for the past 24 hrs:  BP Temp Temp src Pulse Resp SpO2 Height Weight  01/01/24 0427 135/68 98.2 F (36.8 C) Oral 63 20 97 % -- --  01/01/24 0021 118/68 97.9 F (36.6 C) Oral 61 20 98 % -- --  12/31/23 2104 117/67 98.1 F (36.7 C) Oral 70 18 97 % -- --  12/31/23 1433 (!) 133/93 -- -- 71 18 100 % -- --  12/31/23 1415 105/78 -- -- 73 14 99 % -- --  12/31/23 1400 128/66 -- -- 65 16 99 % -- --  12/31/23 1355 -- -- -- 63 11 100 % -- --  12/31/23 1345 126/70 -- -- 64 11 99 % -- --  12/31/23 1330 122/72 -- -- 69 14 100 % -- --  12/31/23 1325 -- -- -- -- 12 -- -- --  12/31/23 1315 109/68 -- -- 72 11 90 % -- --  12/31/23 1300 124/83 -- -- 74 13 100 % -- --  12/31/23 1255 139/75 97.7 F (36.5 C) -- 75 14 100 % -- --  12/31/23 1005 132/69 -- -- 67 13 96 % -- --  12/31/23 1000 130/75 -- -- 65  11 98 % -- --  12/31/23 0955 138/69 -- -- 66 14 99 % -- --  12/31/23 0841 135/79 98.1 F (36.7 C) Oral 77 18 98 % 5' 3 (1.6 m) 75.8 kg     Recent laboratory studies: No results for input(s): WBC, HGB, HCT, PLT, NA, K, CL, CO2, BUN, CREATININE, GLUCOSE, INR, CALCIUM in the last 72 hours.  Invalid input(s): PT, 2   Discharge Medications:   Allergies as of 01/01/2024       Reactions   Ether    Lost all bodily fluids   Indomethacin Other (See Comments)   REACTION: upset stomach   Penicillins Hives   Doxycycline Rash        Medication List     STOP taking these medications    cyclobenzaprine  10 MG tablet Commonly known as: FLEXERIL    traMADol  50 MG tablet Commonly known as: ULTRAM        TAKE these medications    budesonide -formoterol  160-4.5 MCG/ACT inhaler Commonly known as: SYMBICORT  Inhale 2 puffs into the lungs 2 (two) times daily.   CALCIUM-VITAMIN D  PO Take 1  tablet by mouth daily.   citalopram  40 MG tablet Commonly known as: CELEXA  TAKE 1 TABLET(40 MG) BY MOUTH DAILY   docusate sodium  100 MG capsule Commonly known as: Colace Take 1 capsule (100 mg total) by mouth daily as needed.   fluticasone  50 MCG/ACT nasal spray Commonly known as: FLONASE  INHALE 2 SPRAYS INTO BOTH NOSTRILS ONCE DAILY What changed:  how much to take how to take this when to take this reasons to take this additional instructions   LORazepam  0.5 MG tablet Commonly known as: ATIVAN  take 1 tablet by mouth twice a day if needed   methocarbamol  750 MG tablet Commonly known as: Robaxin -750 Take 1 tablet (750 mg total) by mouth 2 (two) times daily as needed for muscle spasms.   multivitamin tablet Take 1 tablet by mouth daily.   ondansetron  4 MG tablet Commonly known as: Zofran  Take 1 tablet (4 mg total) by mouth every 8 (eight) hours as needed.   oxyCODONE  5 MG immediate release tablet Commonly known as: Roxicodone  Take 1-2 tablets (5-10 mg total) by mouth every 8 (eight) hours as needed. To be taken after surgery   Restasis 0.05 % ophthalmic emulsion Generic drug: cycloSPORINE Place 1 drop into both eyes 2 (two) times daily.   rivaroxaban  10 MG Tabs tablet Commonly known as: XARELTO  Take 1 tablet (10 mg total) by mouth daily. To be taken after surgery to prevent blood clots   simvastatin  40 MG tablet Commonly known as: ZOCOR  TAKE 1 TABLET(40 MG) BY MOUTH AT BEDTIME   vitamin C 250 MG tablet Commonly known as: ASCORBIC ACID Take 500 mg by mouth daily.   vitamin E 180 MG (400 UNITS) capsule Take 400 Units by mouth daily.               Durable Medical Equipment  (From admission, onward)           Start     Ordered   12/31/23 1437  DME Walker rolling  Once       Question Answer Comment  Walker: With 5 Inch Wheels   Patient needs a walker to treat with the following condition Status post left partial knee replacement      12/31/23  1436   12/31/23 1437  DME 3 n 1  Once        12/31/23 1436   12/31/23 1437  DME Bedside commode  Once       Question:  Patient needs a bedside commode to treat with the following condition  Answer:  Status post left partial knee replacement   12/31/23 1436            Diagnostic Studies: DG Knee Right Port Result Date: 12/31/2023 CLINICAL DATA:  Status post total right knee arthroplasty. EXAM: PORTABLE RIGHT KNEE - 1-2 VIEW COMPARISON:  Right knee radiographs 08/30/2023 FINDINGS: Interval total right knee arthroplasty. No perihardware lucency is seen to indicate hardware failure or loosening. Mild-to-moderate joint effusion. Expected postoperative intra-articular and anterior subcutaneous air. No acute fracture or dislocation. IMPRESSION: Interval total right knee arthroplasty without evidence of hardware failure. Electronically Signed   By: Tanda Lyons M.D.   On: 12/31/2023 14:42    Disposition: Discharge disposition: 01-Home or Self Care          Follow-up Information     [facility] Follow up.   Specialty: Home Health Services Why: The home health agency will contact you for the first home visit Contact information: [address] [place] [address] [phone]                  Signed: Ronal LITTIE Grave 01/01/2024, 7:45 AM

## 2024-01-01 NOTE — Progress Notes (Signed)
 Subjective: 1 Day Post-Op Procedure(s) (LRB): RIGHT TOTAL KNEE ARTHROPLASTY (Right) Patient reports pain as mild.    Objective: Vital signs in last 24 hours: Temp:  [97.7 F (36.5 C)-98.2 F (36.8 C)] 98.2 F (36.8 C) (01/07 0427) Pulse Rate:  [61-77] 63 (01/07 0427) Resp:  [11-20] 20 (01/07 0427) BP: (105-139)/(66-93) 135/68 (01/07 0427) SpO2:  [90 %-100 %] 97 % (01/07 0427) Weight:  [75.8 kg] 75.8 kg (01/06 0841)  Intake/Output from previous day: 01/06 0701 - 01/07 0700 In: 1229.4 [P.O.:240; I.V.:700; IV Piggyback:289.4] Out: 700 [Urine:600; Blood:100] Intake/Output this shift: No intake/output data recorded.  No results for input(s): HGB in the last 72 hours. No results for input(s): WBC, RBC, HCT, PLT in the last 72 hours. No results for input(s): NA, K, CL, CO2, BUN, CREATININE, GLUCOSE, CALCIUM in the last 72 hours. No results for input(s): LABPT, INR in the last 72 hours.  Neurologically intact Neurovascular intact Sensation intact distally Intact pulses distally Dorsiflexion/Plantar flexion intact Incision: scant drainage No cellulitis present Compartment soft   Assessment/Plan: 1 Day Post-Op Procedure(s) (LRB): RIGHT TOTAL KNEE ARTHROPLASTY (Right) Advance diet Up with therapy D/C IV fluids Discharge home with home health once cleared by PT WBAT RLE      Tamara Hughes 01/01/2024, 7:44 AM

## 2024-01-01 NOTE — Progress Notes (Signed)
 Physical Therapy Treatment Patient Details Name: Tamara Hughes MRN: [id number] DOB: 1951/03/05 Today's Date: 01/01/2024   History of Present Illness Pt is 73 year old presented to [facility] on  12/31/23 for rt TKR. PMH - arthritis    PT Comments  Continuing work on functional mobility and activity tolerance;  Session focused on gait training, stair training in prep for dc today; Good progress and managing well; Discussed car transfers and practiced stairs; Questions solicited and answered; OK for dc home from PT standpoint    If plan is discharge home, recommend the following: A little help with walking and/or transfers;A little help with bathing/dressing/bathroom;Assistance with cooking/housework;Help with stairs or ramp for entrance;Assist for transportation   Can travel by private vehicle        Equipment Recommendations  None recommended by PT (well-equipped)    Recommendations for Other Services       Precautions / Restrictions Precautions Precautions: Knee Precaution Booklet Issued: Yes (comment) Precaution Comments: Pt educated to not allow any pillow or bolster under knee for healing with optimal range of motion.  Restrictions Weight Bearing Restrictions Per Provider Order: Yes RLE Weight Bearing Per Provider Order: Weight bearing as tolerated     Mobility  Bed Mobility               General bed mobility comments: in recliner upon arrival; Pt reports no difficulty getting to EOB    Transfers Overall transfer level: Needs assistance Equipment used: Rolling walker (2 wheels) Transfers: Sit to/from Stand Sit to Stand: Supervision           General transfer comment: for safety only; good hand placement and rise    Ambulation/Gait Ambulation/Gait assistance: Contact guard assist, Supervision Gait Distance (Feet): 350 Feet Assistive device: Rolling walker (2 wheels) Gait Pattern/deviations: Step-through pattern Gait velocity: decr     General Gait Details:  Slow pace, but steady with RW; no R knee buckle; initially CGA, progressed well to Supervision   Stairs Stairs: Yes Stairs assistance: Min assist, Contact guard assist Stair Management: One rail Right, Forwards Number of Stairs: 5 General stair comments: Cues for sequence; overall managing well, including descending with 2 hands on rail without difficulty   Wheelchair Mobility     Tilt Bed    Modified Rankin (Stroke Patients Only)       Balance Overall balance assessment: Mild deficits observed, not formally tested                                          Cognition Arousal: Alert Behavior During Therapy: WFL for tasks assessed/performed Overall Cognitive Status: Within Functional Limits for tasks assessed                                          Exercises Total Joint Exercises Straight Leg Raises: AROM, Right, 5 reps Knee Flexion: AROM, AAROM, 5 reps, Seated (self-AAROM with RLE for overpressure) Goniometric ROM: approx 0-85 degrees    General Comments General comments (skin integrity, edema, etc.): Discussed equipment adn assist at home, as well as car transfer      Pertinent Vitals/Pain Pain Assessment Pain Assessment: Faces Faces Pain Scale: Hurts little more Pain Location: rt knee Pain Descriptors / Indicators: Tightness Pain Intervention(s): Monitored during session    Home Living  Family/patient expects to be discharged to:: Private residence Living Arrangements: Children Available Help at Discharge: Family;Available 24 hours/day Type of Home: House Home Access: Stairs to enter Entrance Stairs-Rails: Right Entrance Stairs-Number of Steps: 3-4 Alternate Level Stairs-Number of Steps: flight Home Layout: Two level;Able to live on main level with bedroom/bathroom Home Equipment: Rolling Walker (2 wheels);Rollator (4 wheels);BSC/3in1;Tub bench;Shower seat;Hospital bed (equipment acquired from mother) Additional Comments:  Pt has main level bedroom and bathroom    Prior Function            PT Goals (current goals can now be found in the care plan section) Acute Rehab PT Goals Patient Stated Goal: return home PT Goal Formulation: With patient Time For Goal Achievement: 01/07/24 Potential to Achieve Goals: Good Progress towards PT goals: Progressing toward goals    Frequency    7X/week      PT Plan      Co-evaluation              AM-PAC PT 6 Clicks Mobility   Outcome Measure  Help needed turning from your back to your side while in a flat bed without using bedrails?: A Little Help needed moving from lying on your back to sitting on the side of a flat bed without using bedrails?: A Little Help needed moving to and from a bed to a chair (including a wheelchair)?: A Little Help needed standing up from a chair using your arms (e.g., wheelchair or bedside chair)?: None Help needed to walk in hospital room?: A Little Help needed climbing 3-5 steps with a railing? : A Little 6 Click Score: 19    End of Session Equipment Utilized During Treatment: Gait belt Activity Tolerance: Patient tolerated treatment well Patient left: in chair;with call bell/phone within reach Nurse Communication: Mobility status;Other (comment) (ok for dc) PT Visit Diagnosis: Other abnormalities of gait and mobility (R26.89);Pain Pain - Right/Left: Right Pain - part of body: Knee     Time: 9156-9069 PT Time Calculation (min) (ACUTE ONLY): 47 min  Charges:    $Gait Training: 8-22 mins $Therapeutic Exercise: 8-22 mins $Therapeutic Activity: 8-22 mins PT General Charges $$ ACUTE PT VISIT: 1 Visit                     Silvano Currier, PT  Acute Rehabilitation Services Office [phone] Secure Chat welcomed    Silvano VEAR Currier 01/01/2024, 9:56 AM

## 2024-01-03 DIAGNOSIS — F419 Anxiety disorder, unspecified: Secondary | ICD-10-CM | POA: Diagnosis not present

## 2024-01-03 DIAGNOSIS — Z471 Aftercare following joint replacement surgery: Secondary | ICD-10-CM | POA: Diagnosis not present

## 2024-01-03 DIAGNOSIS — I959 Hypotension, unspecified: Secondary | ICD-10-CM | POA: Diagnosis not present

## 2024-01-03 DIAGNOSIS — I8391 Asymptomatic varicose veins of right lower extremity: Secondary | ICD-10-CM | POA: Diagnosis not present

## 2024-01-03 DIAGNOSIS — E785 Hyperlipidemia, unspecified: Secondary | ICD-10-CM | POA: Diagnosis not present

## 2024-01-03 DIAGNOSIS — J45909 Unspecified asthma, uncomplicated: Secondary | ICD-10-CM | POA: Diagnosis not present

## 2024-01-03 DIAGNOSIS — H9193 Unspecified hearing loss, bilateral: Secondary | ICD-10-CM | POA: Diagnosis not present

## 2024-01-03 DIAGNOSIS — F32A Depression, unspecified: Secondary | ICD-10-CM | POA: Diagnosis not present

## 2024-01-03 DIAGNOSIS — Z96651 Presence of right artificial knee joint: Secondary | ICD-10-CM | POA: Diagnosis not present

## 2024-01-08 DIAGNOSIS — I8391 Asymptomatic varicose veins of right lower extremity: Secondary | ICD-10-CM | POA: Diagnosis not present

## 2024-01-08 DIAGNOSIS — E785 Hyperlipidemia, unspecified: Secondary | ICD-10-CM | POA: Diagnosis not present

## 2024-01-08 DIAGNOSIS — J45909 Unspecified asthma, uncomplicated: Secondary | ICD-10-CM | POA: Diagnosis not present

## 2024-01-08 DIAGNOSIS — I959 Hypotension, unspecified: Secondary | ICD-10-CM | POA: Diagnosis not present

## 2024-01-08 DIAGNOSIS — F32A Depression, unspecified: Secondary | ICD-10-CM | POA: Diagnosis not present

## 2024-01-08 DIAGNOSIS — Z96651 Presence of right artificial knee joint: Secondary | ICD-10-CM | POA: Diagnosis not present

## 2024-01-08 DIAGNOSIS — H9193 Unspecified hearing loss, bilateral: Secondary | ICD-10-CM | POA: Diagnosis not present

## 2024-01-08 DIAGNOSIS — F419 Anxiety disorder, unspecified: Secondary | ICD-10-CM | POA: Diagnosis not present

## 2024-01-08 DIAGNOSIS — Z471 Aftercare following joint replacement surgery: Secondary | ICD-10-CM | POA: Diagnosis not present

## 2024-01-09 DIAGNOSIS — F32A Depression, unspecified: Secondary | ICD-10-CM | POA: Diagnosis not present

## 2024-01-09 DIAGNOSIS — Z96651 Presence of right artificial knee joint: Secondary | ICD-10-CM | POA: Diagnosis not present

## 2024-01-09 DIAGNOSIS — F419 Anxiety disorder, unspecified: Secondary | ICD-10-CM | POA: Diagnosis not present

## 2024-01-09 DIAGNOSIS — E785 Hyperlipidemia, unspecified: Secondary | ICD-10-CM | POA: Diagnosis not present

## 2024-01-09 DIAGNOSIS — I8391 Asymptomatic varicose veins of right lower extremity: Secondary | ICD-10-CM | POA: Diagnosis not present

## 2024-01-09 DIAGNOSIS — H9193 Unspecified hearing loss, bilateral: Secondary | ICD-10-CM | POA: Diagnosis not present

## 2024-01-09 DIAGNOSIS — I959 Hypotension, unspecified: Secondary | ICD-10-CM | POA: Diagnosis not present

## 2024-01-09 DIAGNOSIS — Z471 Aftercare following joint replacement surgery: Secondary | ICD-10-CM | POA: Diagnosis not present

## 2024-01-09 DIAGNOSIS — J45909 Unspecified asthma, uncomplicated: Secondary | ICD-10-CM | POA: Diagnosis not present

## 2024-01-11 DIAGNOSIS — I959 Hypotension, unspecified: Secondary | ICD-10-CM | POA: Diagnosis not present

## 2024-01-11 DIAGNOSIS — J45909 Unspecified asthma, uncomplicated: Secondary | ICD-10-CM | POA: Diagnosis not present

## 2024-01-11 DIAGNOSIS — H9193 Unspecified hearing loss, bilateral: Secondary | ICD-10-CM | POA: Diagnosis not present

## 2024-01-11 DIAGNOSIS — F419 Anxiety disorder, unspecified: Secondary | ICD-10-CM | POA: Diagnosis not present

## 2024-01-11 DIAGNOSIS — I8391 Asymptomatic varicose veins of right lower extremity: Secondary | ICD-10-CM | POA: Diagnosis not present

## 2024-01-11 DIAGNOSIS — E785 Hyperlipidemia, unspecified: Secondary | ICD-10-CM | POA: Diagnosis not present

## 2024-01-11 DIAGNOSIS — Z471 Aftercare following joint replacement surgery: Secondary | ICD-10-CM | POA: Diagnosis not present

## 2024-01-11 DIAGNOSIS — Z96651 Presence of right artificial knee joint: Secondary | ICD-10-CM | POA: Diagnosis not present

## 2024-01-11 DIAGNOSIS — F32A Depression, unspecified: Secondary | ICD-10-CM | POA: Diagnosis not present

## 2024-01-14 ENCOUNTER — Other Ambulatory Visit: Payer: Self-pay

## 2024-01-14 DIAGNOSIS — Z471 Aftercare following joint replacement surgery: Secondary | ICD-10-CM | POA: Diagnosis not present

## 2024-01-14 DIAGNOSIS — Z96651 Presence of right artificial knee joint: Secondary | ICD-10-CM

## 2024-01-14 DIAGNOSIS — E785 Hyperlipidemia, unspecified: Secondary | ICD-10-CM | POA: Diagnosis not present

## 2024-01-14 DIAGNOSIS — I959 Hypotension, unspecified: Secondary | ICD-10-CM | POA: Diagnosis not present

## 2024-01-14 DIAGNOSIS — F32A Depression, unspecified: Secondary | ICD-10-CM | POA: Diagnosis not present

## 2024-01-14 DIAGNOSIS — J45909 Unspecified asthma, uncomplicated: Secondary | ICD-10-CM | POA: Diagnosis not present

## 2024-01-14 DIAGNOSIS — H9193 Unspecified hearing loss, bilateral: Secondary | ICD-10-CM | POA: Diagnosis not present

## 2024-01-14 DIAGNOSIS — I8391 Asymptomatic varicose veins of right lower extremity: Secondary | ICD-10-CM | POA: Diagnosis not present

## 2024-01-14 DIAGNOSIS — F419 Anxiety disorder, unspecified: Secondary | ICD-10-CM | POA: Diagnosis not present

## 2024-01-15 ENCOUNTER — Ambulatory Visit (INDEPENDENT_AMBULATORY_CARE_PROVIDER_SITE_OTHER): Payer: Medicare PPO | Admitting: Physician Assistant

## 2024-01-15 ENCOUNTER — Ambulatory Visit: Payer: Medicare PPO | Admitting: Physical Therapy

## 2024-01-15 DIAGNOSIS — Z96651 Presence of right artificial knee joint: Secondary | ICD-10-CM

## 2024-01-15 NOTE — Progress Notes (Signed)
Post-Op Visit Note   Patient: Tamara Hughes           Date of Birth: Aug 03, 1951           MRN: [id number] Visit Date: 01/15/2024 PCP: Shirline Frees, NP   Assessment & Plan:  Chief Complaint:  Chief Complaint  Patient presents with   Right Knee - Pain   Visit Diagnoses:  1. Status post total right knee replacement     Plan: Patient is a pleasant 64 old female who comes in today 2 weeks status post right total knee replacement 12/31/2023.  She has been doing well.  She has been taking oxycodone daily in addition to Robaxin which has controlled her pain.  She is ambulating with a walker.  She recently finished home health physical therapy and is scheduled to start outpatient physical therapy in the near future.  She has been compliant taking Xarelto for DVT prophylaxis.  Examination of the right knee: Well-healing surgical incision with nylon sutures in place.  No evidence of infection or cellulitis.  Calves are soft nontender.  She is neurovascularly intact distally.  Today, sutures were removed and Steri-Strips applied.  She will continue taking her Xarelto for another 2 weeks.  She will then transition to taking a baby aspirin twice daily for 2 weeks.  She is starting physical therapy either today or tomorrow.  Postoperative instructions provided to include dental prophylaxis.  She will follow-up with [name] in 4 weeks for repeat evaluation and 2 view x-rays of the right knee.  Call with concerns or questions in the meantime.  Follow-Up Instructions: Return in about 4 weeks (around 02/12/2024).   Orders:  No orders of the defined types were placed in this encounter.  No orders of the defined types were placed in this encounter.   Imaging: No new imaging  PMFS History: Patient Active Problem List   Diagnosis Date Noted   Primary osteoarthritis of right knee 12/31/2023   Status post total right knee replacement 12/31/2023   OA (osteoarthritis) of knee 12/31/2023   Pre-operative  clearance 11/08/2023   Varicose veins of both legs with edema 09/16/2018   Depression, major, single episode, complete remission (HCC) 09/24/2017   Routine general medical examination at a health care facility 09/24/2017   Bilateral temporomandibular joint pain 04/06/2016   Disorder of left eustachian tube 04/06/2016   Otalgia of both ears 04/06/2016   Family history of breast cancer    Allergic rhinitis 07/04/2007   Past Medical History:  Diagnosis Date   Allergy    Anxiety    Arthritis    Depression    Difficult intubation    pt states she was told it was hard to intubate her due to "it not being straight." This was only told to her during her hysteroscopy   Dyspnea    pt states this is usually due to weather, and she may have to use her inhaler (seasonal allergies)   Family history of breast cancer    Hyperlipidemia    Osteoarthritis of right knee    Varicose vein of leg    RLE    Family History  Problem Relation Age of Onset   Breast cancer Mother 70   Bone cancer Father 70   Breast cancer Sister 72   Heart attack Sister    Diabetes Sister    Breast cancer Maternal Aunt        dx >50   Cancer Maternal Aunt  NOS   Breast cancer Other        MGF's sister #1   Hyperlipidemia Other    Hypertension Other    Thyroid disease Other    Stomach cancer Neg Hx    Rectal cancer Neg Hx    Pancreatic cancer Neg Hx    Colon cancer Neg Hx     Past Surgical History:  Procedure Laterality Date   GUM SURGERY     HYSTEROSCOPY     KNEE SURGERY Right    TOTAL KNEE ARTHROPLASTY Right 12/31/2023   Procedure: RIGHT TOTAL KNEE ARTHROPLASTY;  Surgeon: Tarry Kos, MD;  Location: [facility] OR;  Service: Orthopedics;  Laterality: Right;   Social History   Occupational History   Not on file  Tobacco Use   Smoking status: Former    Current packs/day: 0.00    Average packs/day: 1 pack/day for 15.0 years (15.0 ttl pk-yrs)    Types: Cigarettes    Start date: 04/20/1968    Quit date:  04/21/1983    Years since quitting: 40.7   Smokeless tobacco: Never  Vaping Use   Vaping status: Never Used  Substance and Sexual Activity   Alcohol use: Yes    Alcohol/week: 1.0 standard drink of alcohol    Types: 1 Cans of beer per week    Comment: maybe one beer every couple of weeks   Drug use: No   Sexual activity: Not on file

## 2024-01-16 ENCOUNTER — Ambulatory Visit: Payer: Medicare PPO | Admitting: Physical Therapy

## 2024-01-17 ENCOUNTER — Encounter: Payer: Self-pay | Admitting: Physical Therapy

## 2024-01-17 ENCOUNTER — Ambulatory Visit: Payer: Medicare PPO | Admitting: Physical Therapy

## 2024-01-17 DIAGNOSIS — M25561 Pain in right knee: Secondary | ICD-10-CM

## 2024-01-17 DIAGNOSIS — M25661 Stiffness of right knee, not elsewhere classified: Secondary | ICD-10-CM

## 2024-01-17 DIAGNOSIS — R600 Localized edema: Secondary | ICD-10-CM | POA: Diagnosis not present

## 2024-01-17 DIAGNOSIS — R2689 Other abnormalities of gait and mobility: Secondary | ICD-10-CM

## 2024-01-17 DIAGNOSIS — M6281 Muscle weakness (generalized): Secondary | ICD-10-CM | POA: Diagnosis not present

## 2024-01-17 NOTE — Therapy (Signed)
OUTPATIENT PHYSICAL THERAPY LOWER EXTREMITY EVALUATION Referring diagnosis? S/P total knee arthroplasty, right  - Primary [phone]  Treatment diagnosis? (if different than referring diagnosis) m25.561 What was this (referring dx) caused by? [x]  Surgery []  Fall []  Ongoing issue []  Arthritis []  Other: ____________  Laterality: [x]  Rt []  Lt []  Both  Check all possible CPT codes:  *CHOOSE 10 OR LESS*    See Planned Interventions listed in the Plan section of the Evaluation.    Patient Name: Tamara Hughes MRN: [id number] DOB:10/09/1951, 73 y.o., female Today's Date: 01/17/2024  END OF SESSION:  PT End of Session - 01/17/24 1139     Visit Number 1    Number of Visits 15    Date for PT Re-Evaluation 03/13/24    Authorization Type [employer] MCR    PT Start Time 1110    PT Stop Time 1145    PT Time Calculation (min) 35 min    Activity Tolerance Patient tolerated treatment well    Behavior During Therapy WFL for tasks assessed/performed             Past Medical History:  Diagnosis Date   Allergy    Anxiety    Arthritis    Depression    Difficult intubation    pt states she was told it was hard to intubate her due to "it not being straight." This was only told to her during her hysteroscopy   Dyspnea    pt states this is usually due to weather, and she may have to use her inhaler (seasonal allergies)   Family history of breast cancer    Hyperlipidemia    Osteoarthritis of right knee    Varicose vein of leg    RLE   Past Surgical History:  Procedure Laterality Date   GUM SURGERY     HYSTEROSCOPY     KNEE SURGERY Right    TOTAL KNEE ARTHROPLASTY Right 12/31/2023   Procedure: RIGHT TOTAL KNEE ARTHROPLASTY;  Surgeon: Tarry Kos, MD;  Location: [facility] OR;  Service: Orthopedics;  Laterality: Right;   Patient Active Problem List   Diagnosis Date Noted   Primary osteoarthritis of right knee 12/31/2023   Status post total right knee replacement 12/31/2023   OA  (osteoarthritis) of knee 12/31/2023   Pre-operative clearance 11/08/2023   Varicose veins of both legs with edema 09/16/2018   Depression, major, single episode, complete remission (HCC) 09/24/2017   Routine general medical examination at a health care facility 09/24/2017   Bilateral temporomandibular joint pain 04/06/2016   Disorder of left eustachian tube 04/06/2016   Otalgia of both ears 04/06/2016   Family history of breast cancer    Allergic rhinitis 07/04/2007    PCP: Shirline Frees, NP   REFERRING PROVIDER: Roda Shutters, MD  REFERRING DIAG: S/P total knee arthroplasty, right  - Primary [phone]    THERAPY DIAG:  Acute pain of right knee  Stiffness of right knee, not elsewhere classified  Muscle weakness (generalized)  Other abnormalities of gait and mobility  Localized edema  Rationale for Evaluation and Treatment: Rehabilitation  ONSET DATE: 12/31/2023; s/p R TKA  SUBJECTIVE:   SUBJECTIVE STATEMENT: She says she has been doing good with pain but was supposed to come last week after she got her stiches removed but she got sick. She does complain of lightheadiness and checked her blood pressure before she came and it was fine  PERTINENT HISTORY: Anxiety and depression, Hyperlipidemia, Vitamin D deficiency, Varicose veins of both legs with edema (  2019)   PAIN:  NPRS scale: 0 currently at worse gets to 6/10 Pain location: anterior medial Rt knee Pain description: tender, swollen, pain is not constant Aggravating factors: overdoing it with activity and CPM Relieving factors: ice, elevation, movment  PRECAUTIONS: Knee  WEIGHT BEARING RESTRICTIONS: No  FALLS:  Has patient fallen in last 6 months? No  LIVING ENVIRONMENT: 4 steps to enter house, with handrail on Right  OCCUPATION: retired  PLOF: Independent, did not use AD   PATIENT GOALS: walk without the walkder, want to be able to squat  Next MD visit: none scheduled yet, saw Dr. this morning at  10am  OBJECTIVE:  DIAGNOSTIC FINDINGS:  Interval total right knee arthroplasty. No perihardware lucency is seen to indicate hardware failure or loosening. Mild-to-moderate joint effusion. Expected postoperative intra-articular and anterior subcutaneous air. No acute fracture or dislocation.   IMPRESSION: Interval total right knee arthroplasty without evidence of hardware failure.  PATIENT SURVEYS:  FOTO intake:58% functional    predicted:  16  COGNITION: Overall cognitive status: WFL     EDEMA:  Moderate edema noted in Right knee    LOWER EXTREMITY ROM:   AROM/PROM Right eval   Hip flexion    Hip extension    Hip abduction    Hip adduction    Hip internal rotation    Hip external rotation    Knee flexion 65/80   Knee extension 10/8   Ankle dorsiflexion    Ankle plantarflexion    Ankle inversion    Ankle eversion     (Blank rows = not tested)  LOWER EXTREMITY MMT:  MMT Right eval   Hip flexion 4   Hip extension    Hip abduction    Hip adduction    Hip internal rotation    Hip external rotation    Knee flexion 4   Knee extension 4   Ankle dorsiflexion    Ankle plantarflexion    Ankle inversion    Ankle eversion     (Blank rows = not tested)   FUNCTIONAL TESTS:  18 inch chair transfer: needs UE support to stand  GAIT: Distance walked: 75 feet Assistive device utilized: Environmental consultant - 2 wheeled Level of assistance: Complete Independence   TODAY'S TREATMENT:  Eval HEP creation and review with demonstration and trial set preformed, see below for details Rt knee PROM with overpressure to tolerance -Vasopnuematic device X 10 min, medium compression, 34 deg to Rt knee    PATIENT EDUCATION: Education details: HEP, PT plan of care Person educated: Patient Education method: Explanation, Demonstration, Verbal cues, and Handouts Education comprehension: verbalized understanding and needs further education   HOME EXERCISE PROGRAM: Access Code:  [email or web address] URL: [email or web address] Date: 01/17/2024 Prepared by: Ivery Quale  Exercises - Supine Heel Slide with Strap  - 3 x daily - 6 x weekly - 1 sets - 10 reps - 5 hold - Supine Active Straight Leg Raise  - 2 x daily - 6 x weekly - 1-2 sets - 10 reps - Supine Knee Extension Stretch on Towel Roll  - 3 x daily - 6 x weekly - 1 sets - 10 reps - 5 sec hold - Seated Knee Flexion AAROM  - 3 x daily - 6 x weekly - 1 sets - 10 reps - 5 hold - Sit to Stand with Armchair  - 2 x daily - 6 x weekly - 2 sets - 5 reps  ASSESSMENT:  CLINICAL IMPRESSION: Patient referred to PT status post right total  knee replacement 12/31/2023 . Patient will benefit from skilled PT to address below impairments, limitations and improve overall function.  OBJECTIVE IMPAIRMENTS: decreased activity tolerance, difficulty walking, decreased balance, decreased endurance, decreased mobility, decreased ROM, decreased strength, impaired flexibility, impaired LE use, postural dysfunction, and pain.  ACTIVITY LIMITATIONS: bending, lifting, carry, locomotion, cleaning, community activity, driving,  PERSONAL FACTORS: see above PMH are also affecting patient's functional outcome.  REHAB POTENTIAL: Good  CLINICAL DECISION MAKING: Stable/uncomplicated  EVALUATION COMPLEXITY: Low    GOALS: Short term PT Goals Target date: 02/14/2024   Pt will be I and compliant with HEP. Baseline:  Goal status: New Pt will be able to stand from 18 inch chair without UE support Baseline: Goal status: New  Long term PT goals Target date:03/13/2024   Pt will improve Rt knee AROM to WFL (0-110 deg) to improve functional mobility Baseline: Goal status: New Pt will improve  hip/knee strength to at least 5-/5 MMT to improve functional strength Baseline: Goal status: New Pt will improve FOTO to at least % functional to show improved function Baseline: Goal status: New Pt will reduce pain to overall less than 2-3/10 with  usual activity Baseline: Goal status: New Pt will be able to ambulate community distances at least 500 ft WNL gait pattern and independently without complaints Baseline: Goal status: New  PLAN: PT FREQUENCY: 2-3 times per week   PT DURATION: 6-8 weeks  PLANNED INTERVENTIONS (unless contraindicated):  97110-Therapeutic exercises, 97530- Therapeutic activity, [id number]- Neuromuscular re-education, 97535- Self Care, 01027- Manual therapy, [id number]- Gait training, [id number]- Aquatic Therapy, 97016- Vasopneumatic device, [id number]- Ultrasound, and Balance training  PLAN FOR NEXT SESSION: review HEP, knee ROM focus   April Manson, PT,DPT 01/17/2024, 11:40 AM

## 2024-01-22 ENCOUNTER — Encounter: Payer: Self-pay | Admitting: Physical Therapy

## 2024-01-22 ENCOUNTER — Ambulatory Visit: Payer: Medicare PPO | Admitting: Physical Therapy

## 2024-01-22 DIAGNOSIS — M25661 Stiffness of right knee, not elsewhere classified: Secondary | ICD-10-CM

## 2024-01-22 DIAGNOSIS — M25561 Pain in right knee: Secondary | ICD-10-CM | POA: Diagnosis not present

## 2024-01-22 DIAGNOSIS — R2689 Other abnormalities of gait and mobility: Secondary | ICD-10-CM

## 2024-01-22 DIAGNOSIS — M6281 Muscle weakness (generalized): Secondary | ICD-10-CM

## 2024-01-22 DIAGNOSIS — R600 Localized edema: Secondary | ICD-10-CM | POA: Diagnosis not present

## 2024-01-22 NOTE — Therapy (Signed)
OUTPATIENT PHYSICAL THERAPY LOWER EXTREMITY EVALUATION Referring diagnosis? S/P total knee arthroplasty, right  - Primary [phone]  Treatment diagnosis? (if different than referring diagnosis) m25.561 What was this (referring dx) caused by? [x]  Surgery []  Fall []  Ongoing issue []  Arthritis []  Other: ____________  Laterality: [x]  Rt []  Lt []  Both  Check all possible CPT codes:  *CHOOSE 10 OR LESS*    See Planned Interventions listed in the Plan section of the Evaluation.    Patient Name: Tamara Hughes MRN: [id number] DOB:[birth year], 73 y.o., female Today's Date: 01/22/2024  END OF SESSION:  PT End of Session - 01/22/24 1458     Visit Number 2    Number of Visits 15    Date for PT Re-Evaluation 03/13/24    Authorization Type [employer] MCR    PT Start Time 1431    PT Stop Time 1511    PT Time Calculation (min) 40 min    Activity Tolerance Patient tolerated treatment well    Behavior During Therapy WFL for tasks assessed/performed              Past Medical History:  Diagnosis Date   Allergy    Anxiety    Arthritis    Depression    Difficult intubation    pt states she was told it was hard to intubate her due to "it not being straight." This was only told to her during her hysteroscopy   Dyspnea    pt states this is usually due to weather, and she may have to use her inhaler (seasonal allergies)   Family history of breast cancer    Hyperlipidemia    Osteoarthritis of right knee    Varicose vein of leg    RLE   Past Surgical History:  Procedure Laterality Date   GUM SURGERY     HYSTEROSCOPY     KNEE SURGERY Right    TOTAL KNEE ARTHROPLASTY Right 12/31/2023   Procedure: RIGHT TOTAL KNEE ARTHROPLASTY;  Surgeon: Tarry Kos, MD;  Location: [facility] OR;  Service: Orthopedics;  Laterality: Right;   Patient Active Problem List   Diagnosis Date Noted   Primary osteoarthritis of right knee 12/31/2023   Status post total right knee replacement 12/31/2023   OA  (osteoarthritis) of knee 12/31/2023   Pre-operative clearance 11/08/2023   Varicose veins of both legs with edema 09/16/2018   Depression, major, single episode, complete remission (HCC) 09/24/2017   Routine general medical examination at a health care facility 09/24/2017   Bilateral temporomandibular joint pain 04/06/2016   Disorder of left eustachian tube 04/06/2016   Otalgia of both ears 04/06/2016   Family history of breast cancer    Allergic rhinitis 07/04/2007    PCP: Shirline Frees, NP   REFERRING PROVIDER: Roda Shutters, MD  REFERRING DIAG: S/P total knee arthroplasty, right  - Primary [phone]    THERAPY DIAG:  Acute pain of right knee  Stiffness of right knee, not elsewhere classified  Muscle weakness (generalized)  Other abnormalities of gait and mobility  Localized edema  Rationale for Evaluation and Treatment: Rehabilitation  ONSET DATE: 12/31/2023; s/p R TKA  SUBJECTIVE:   SUBJECTIVE STATEMENT: No knee pain at rest, it hurts when she tries to move it. She has been doing her exercises which make it feel less tight. She has been using ice.  PERTINENT HISTORY: Anxiety and depression, Hyperlipidemia, Vitamin D deficiency, Varicose veins of both legs with edema (2019)   PAIN:  NPRS scale:2/10 with movement today Pain location:  anterior medial Rt knee Pain description: tender, swollen, pain is not constant Aggravating factors: overdoing it with activity and CPM Relieving factors: ice, elevation, movment  PRECAUTIONS: Knee  WEIGHT BEARING RESTRICTIONS: No  FALLS:  Has patient fallen in last 6 months? No  LIVING ENVIRONMENT: 4 steps to enter house, with handrail on Right  OCCUPATION: retired  PLOF: Independent, did not use AD   PATIENT GOALS: walk without the walkder, want to be able to squat  Next MD visit: none scheduled yet, saw Dr. this morning at 10am  OBJECTIVE:  DIAGNOSTIC FINDINGS:  Interval total right knee arthroplasty. No perihardware lucency  is seen to indicate hardware failure or loosening. Mild-to-moderate joint effusion. Expected postoperative intra-articular and anterior subcutaneous air. No acute fracture or dislocation.   IMPRESSION: Interval total right knee arthroplasty without evidence of hardware failure.  PATIENT SURVEYS:  FOTO intake:58% functional    predicted:  58  COGNITION: Overall cognitive status: WFL     EDEMA:  Moderate edema noted in Right knee    LOWER EXTREMITY ROM:   AROM/PROM Right eval   Hip flexion    Hip extension    Hip abduction    Hip adduction    Hip internal rotation    Hip external rotation    Knee flexion 65/80   Knee extension 10/8   Ankle dorsiflexion    Ankle plantarflexion    Ankle inversion    Ankle eversion     (Blank rows = not tested)  LOWER EXTREMITY MMT:  MMT Right eval   Hip flexion 4   Hip extension    Hip abduction    Hip adduction    Hip internal rotation    Hip external rotation    Knee flexion 4   Knee extension 4   Ankle dorsiflexion    Ankle plantarflexion    Ankle inversion    Ankle eversion     (Blank rows = not tested)   FUNCTIONAL TESTS:  18 inch chair transfer: needs UE support to stand  GAIT: Distance walked: 75 feet Assistive device utilized: Environmental consultant - 2 wheeled Level of assistance: Complete Independence   TODAY'S TREATMENT:  Eval HEP review Nu step L5 X 6 min seat #8 UE/LE Standing gastroc stretch on slantboard 30 sec X 3 Standing knee flexion stretch with foot on treadmill and UE support 5 sec X 10 Seated LAQ 2#  2X10 Seated knee flexion AAROM 5 sec X 10 Supine heel prop with quad sets 5 sec  X10  -Manual therapy for Rt knee PROM with overpressure to tolerance into flexion.   -Vasopnuematic device X 10 min, medium compression, 34 deg to Rt knee  Eval HEP creation and review with demonstration and trial set preformed, see below for details Rt knee PROM with overpressure to tolerance -Vasopnuematic device X 10  min, medium compression, 34 deg to Rt knee    PATIENT EDUCATION: Education details: HEP, PT plan of care Person educated: Patient Education method: Explanation, Demonstration, Verbal cues, and Handouts Education comprehension: verbalized understanding and needs further education   HOME EXERCISE PROGRAM: Access Code: [email or web address] URL: [email or web address] Date: 01/17/2024 Prepared by: Ivery Quale  Exercises - Supine Heel Slide with Strap  - 3 x daily - 6 x weekly - 1 sets - 10 reps - 5 hold - Supine Active Straight Leg Raise  - 2 x daily - 6 x weekly - 1-2 sets - 10 reps - Supine Knee Extension Stretch on Towel Roll  - 3 x daily -  6 x weekly - 1 sets - 10 reps - 5 sec hold - Seated Knee Flexion AAROM  - 3 x daily - 6 x weekly - 1 sets - 10 reps - 5 hold - Sit to Stand with Armchair  - 2 x daily - 6 x weekly - 2 sets - 5 reps  ASSESSMENT:  CLINICAL IMPRESSION: Focused on improving knee ROM which is top priority at this moment. As ROM improves will work toward more strengthening as tolerated.  OBJECTIVE IMPAIRMENTS: decreased activity tolerance, difficulty walking, decreased balance, decreased endurance, decreased mobility, decreased ROM, decreased strength, impaired flexibility, impaired LE use, postural dysfunction, and pain.  ACTIVITY LIMITATIONS: bending, lifting, carry, locomotion, cleaning, community activity, driving,  PERSONAL FACTORS: see above PMH are also affecting patient's functional outcome.  REHAB POTENTIAL: Good  CLINICAL DECISION MAKING: Stable/uncomplicated  EVALUATION COMPLEXITY: Low    GOALS: Short term PT Goals Target date: 02/14/2024   Pt will be I and compliant with HEP. Baseline:  Goal status: New Pt will be able to stand from 18 inch chair without UE support Baseline: Goal status: New  Long term PT goals Target date:03/13/2024   Pt will improve Rt knee AROM to WFL (0-110 deg) to improve functional mobility Baseline: Goal  status: New Pt will improve  hip/knee strength to at least 5-/5 MMT to improve functional strength Baseline: Goal status: New Pt will improve FOTO to at least % functional to show improved function Baseline: Goal status: New Pt will reduce pain to overall less than 2-3/10 with usual activity Baseline: Goal status: New Pt will be able to ambulate community distances at least 500 ft WNL gait pattern and independently without complaints Baseline: Goal status: New  PLAN: PT FREQUENCY: 2-3 times per week   PT DURATION: 6-8 weeks  PLANNED INTERVENTIONS (unless contraindicated):  97110-Therapeutic exercises, 97530- Therapeutic activity, [id number]- Neuromuscular re-education, 97535- Self Care, 16109- Manual therapy, [id number]- Gait training, [id number]- Aquatic Therapy, 97016- Vasopneumatic device, 97035- Ultrasound, and Balance training  PLAN FOR NEXT SESSION: knee ROM focus, vaso if desired.   April Manson, PT,DPT 01/22/2024, 2:58 PM

## 2024-01-23 ENCOUNTER — Encounter: Payer: Medicare PPO | Admitting: Rehabilitative and Restorative Service Providers"

## 2024-01-23 ENCOUNTER — Encounter: Payer: Self-pay | Admitting: Rehabilitative and Restorative Service Providers"

## 2024-01-23 ENCOUNTER — Ambulatory Visit: Payer: Medicare PPO | Admitting: Rehabilitative and Restorative Service Providers"

## 2024-01-23 DIAGNOSIS — M25561 Pain in right knee: Secondary | ICD-10-CM | POA: Diagnosis not present

## 2024-01-23 DIAGNOSIS — R600 Localized edema: Secondary | ICD-10-CM | POA: Diagnosis not present

## 2024-01-23 DIAGNOSIS — M25661 Stiffness of right knee, not elsewhere classified: Secondary | ICD-10-CM | POA: Diagnosis not present

## 2024-01-23 DIAGNOSIS — M6281 Muscle weakness (generalized): Secondary | ICD-10-CM

## 2024-01-23 DIAGNOSIS — R2689 Other abnormalities of gait and mobility: Secondary | ICD-10-CM | POA: Diagnosis not present

## 2024-01-23 NOTE — Therapy (Signed)
OUTPATIENT PHYSICAL THERAPY  TREATMENT   Patient Name: Tamara Hughes MRN: [id number] DOB:July 20, 1951, 73 y.o., female Today's Date: 01/23/2024  END OF SESSION:  PT End of Session - 01/23/24 1315     Visit Number 3    Number of Visits 15    Date for PT Re-Evaluation 03/13/24    Authorization Type [employer] MCR    Authorization Time Period -03/13/2024    Authorization - Visit Number 3    Authorization - Number of Visits 12    Progress Note Due on Visit 10    PT Start Time 1305    PT Stop Time 1354    PT Time Calculation (min) 49 min    Activity Tolerance Patient tolerated treatment well    Behavior During Therapy WFL for tasks assessed/performed               Past Medical History:  Diagnosis Date   Allergy    Anxiety    Arthritis    Depression    Difficult intubation    pt states she was told it was hard to intubate her due to "it not being straight." This was only told to her during her hysteroscopy   Dyspnea    pt states this is usually due to weather, and she may have to use her inhaler (seasonal allergies)   Family history of breast cancer    Hyperlipidemia    Osteoarthritis of right knee    Varicose vein of leg    RLE   Past Surgical History:  Procedure Laterality Date   GUM SURGERY     HYSTEROSCOPY     KNEE SURGERY Right    TOTAL KNEE ARTHROPLASTY Right 12/31/2023   Procedure: RIGHT TOTAL KNEE ARTHROPLASTY;  Surgeon: Tarry Kos, MD;  Location: [facility] OR;  Service: Orthopedics;  Laterality: Right;   Patient Active Problem List   Diagnosis Date Noted   Primary osteoarthritis of right knee 12/31/2023   Status post total right knee replacement 12/31/2023   OA (osteoarthritis) of knee 12/31/2023   Pre-operative clearance 11/08/2023   Varicose veins of both legs with edema 09/16/2018   Depression, major, single episode, complete remission (HCC) 09/24/2017   Routine general medical examination at a health care facility 09/24/2017   Bilateral temporomandibular  joint pain 04/06/2016   Disorder of left eustachian tube 04/06/2016   Otalgia of both ears 04/06/2016   Family history of breast cancer    Allergic rhinitis 07/04/2007    PCP: Shirline Frees, NP   REFERRING PROVIDER: Roda Shutters, MD  REFERRING DIAG: S/P total knee arthroplasty, right  - Primary [phone]    THERAPY DIAG:  Stiffness of right knee, not elsewhere classified  Acute pain of right knee  Muscle weakness (generalized)  Other abnormalities of gait and mobility  Localized edema  Rationale for Evaluation and Treatment: Rehabilitation  ONSET DATE: 12/31/2023; s/p R TKA  SUBJECTIVE:   SUBJECTIVE STATEMENT: No knee pain at rest, it hurts when she tries to move it. She has been doing her exercises which make it feel less tight. She has been using ice.  PERTINENT HISTORY: Anxiety and depression, Hyperlipidemia, Vitamin D deficiency, Varicose veins of both legs with edema (2019)   PAIN:  NPRS scale:2/10 with movement today Pain location: anterior medial Rt knee Pain description: tender, swollen, pain is not constant Aggravating factors: overdoing it with activity and CPM Relieving factors: ice, elevation, movment  PRECAUTIONS: Knee  WEIGHT BEARING RESTRICTIONS: No  FALLS:  Has patient fallen in  last 6 months? No  LIVING ENVIRONMENT: 4 steps to enter house, with handrail on Right  OCCUPATION: retired  PLOF: Independent, did not use AD   PATIENT GOALS: walk without the walkder, want to be able to squat  Next MD visit: none scheduled yet, saw Dr. this morning at 10am  OBJECTIVE:  DIAGNOSTIC FINDINGS:  Interval total right knee arthroplasty. No perihardware lucency is seen to indicate hardware failure or loosening. Mild-to-moderate joint effusion. Expected postoperative intra-articular and anterior subcutaneous air. No acute fracture or dislocation.   IMPRESSION: Interval total right knee arthroplasty without evidence of hardware failure.  PATIENT SURVEYS:   01/17/2024 FOTO intake:58% functional    predicted:  69  COGNITION: 01/17/2024 Overall cognitive status: [facility]     EDEMA:  01/17/2024 Moderate edema noted in Right knee  LOWER EXTREMITY ROM:   AROM/PROM Right 01/17/2024   Hip flexion    Hip extension    Hip abduction    Hip adduction    Hip internal rotation    Hip external rotation    Knee flexion 65/80   Knee extension 10/8   Ankle dorsiflexion    Ankle plantarflexion    Ankle inversion    Ankle eversion     (Blank rows = not tested)  LOWER EXTREMITY MMT:  MMT Right 01/17/2024   Hip flexion 4   Hip extension    Hip abduction    Hip adduction    Hip internal rotation    Hip external rotation    Knee flexion 4   Knee extension 4   Ankle dorsiflexion    Ankle plantarflexion    Ankle inversion    Ankle eversion     (Blank rows = not tested)   FUNCTIONAL TESTS:  01/17/2024 18 inch chair transfer: needs UE support to stand  GAIT: 01/17/2024 Distance walked: 75 feet Assistive device utilized: Environmental consultant - 2 wheeled Level of assistance: Complete Independence                       TODAY'S TREATMENT:                                                                 DATE: 01/23/2024 Manual: Seated Rt knee flexion mobilization c movement c distraction/IR.  Contract/relax techniques into flexion c contralateral leg moving opposite.   Therex: Seated Rt leg LAQ 2 x 15  Incline gastroc stretch 30 sec x 3 bilaterally  Supine Rt leg heel slide AROM 5 sec hold, quad set 5 sec hold combo x 10  Supine Rt leg LAQ in 90 deg hip flexion 2 x 10   Neuro Re-ed Tandem stance 1 min x 1 bilateral in // bars with occasional HHA Retro step x 12 bilateral in // bars with SBA and occasional HHA Tandem stance 1 min x 1 bilateral on foam in // bars c occasional HHA   Vasopneumatic 10 mins medium compression Rt knee in elevation.     TODAY'S TREATMENT:  DATE:  01/22/2024  HEP review Nu step L5 X 6 min seat #8 UE/LE Standing gastroc stretch on slantboard 30 sec X 3 Standing knee flexion stretch with foot on treadmill and UE support 5 sec X 10 Seated LAQ 2#  2X10 Seated knee flexion AAROM 5 sec X 10 Supine heel prop with quad sets 5 sec  X10  -Manual therapy for Rt knee PROM with overpressure to tolerance into flexion.   -Vasopnuematic device X 10 min, medium compression, 34 deg to Rt knee  TODAY'S TREATMENT:                                                                DATE:  01/17/2024 Eval HEP creation and review with demonstration and trial set preformed, see below for details Rt knee PROM with overpressure to tolerance -Vasopnuematic device X 10 min, medium compression, 34 deg to Rt knee    PATIENT EDUCATION: Education details: HEP, PT plan of care Person educated: Patient Education method: Explanation, Demonstration, Verbal cues, and Handouts Education comprehension: verbalized understanding and needs further education   HOME EXERCISE PROGRAM: Access Code: [email or web address] URL: [email or web address] Date: 01/17/2024 Prepared by: Ivery Quale  Exercises - Supine Heel Slide with Strap  - 3 x daily - 6 x weekly - 1 sets - 10 reps - 5 hold - Supine Active Straight Leg Raise  - 2 x daily - 6 x weekly - 1-2 sets - 10 reps - Supine Knee Extension Stretch on Towel Roll  - 3 x daily - 6 x weekly - 1 sets - 10 reps - 5 sec hold - Seated Knee Flexion AAROM  - 3 x daily - 6 x weekly - 1 sets - 10 reps - 5 hold - Sit to Stand with Armchair  - 2 x daily - 6 x weekly - 2 sets - 5 reps  ASSESSMENT:  CLINICAL IMPRESSION:  Pt to benefit from continued skilled PT services to facilitate progression in Rt knee mobility, strength and balance.  Pt appeared close to trial of [facility] in clinic in future to help improve confidence and stability in ambulation without FWW.    OBJECTIVE IMPAIRMENTS: decreased activity tolerance, difficulty walking,  decreased balance, decreased endurance, decreased mobility, decreased ROM, decreased strength, impaired flexibility, impaired LE use, postural dysfunction, and pain.  ACTIVITY LIMITATIONS: bending, lifting, carry, locomotion, cleaning, community activity, driving,  PERSONAL FACTORS: see above PMH are also affecting patient's functional outcome.  REHAB POTENTIAL: Good  CLINICAL DECISION MAKING: Stable/uncomplicated  EVALUATION COMPLEXITY: Low    GOALS: Short term PT Goals Target date: 02/14/2024   Pt will be I and compliant with HEP. Baseline:  Goal status: on going 01/23/2024 Pt will be able to stand from 18 inch chair without UE support Baseline: Goal status: on going 01/23/2024  Long term PT goals Target date:03/13/2024   Pt will improve Rt knee AROM to WFL (0-110 deg) to improve functional mobility Baseline: Goal status: New Pt will improve  hip/knee strength to at least 5-/5 MMT to improve functional strength Baseline: Goal status: New Pt will improve FOTO to at least % functional to show improved function Baseline: Goal status: New Pt will reduce pain to overall less than 2-3/10 with usual activity Baseline: Goal status: New Pt will be able to  ambulate community distances at least 500 ft WNL gait pattern and independently without complaints Baseline: Goal status: New  PLAN: PT FREQUENCY: 2-3 times per week   PT DURATION: 6-8 weeks  PLANNED INTERVENTIONS (unless contraindicated):  97110-Therapeutic exercises, 97530- Therapeutic activity, [id number]- Neuromuscular re-education, 97535- Self Care, 54098- Manual therapy, [id number]- Gait training, [id number]- Aquatic Therapy, 97016- Vasopneumatic device, 97035- Ultrasound, and Balance training  PLAN FOR NEXT SESSION: ROM gains, strengthening, balance progression.    Chyrel Masson, PT, DPT, OCS, ATC 01/23/24  1:46 PM    Referring diagnosis? S/P total knee arthroplasty, right  - Primary [phone]  Treatment diagnosis? (if  different than referring diagnosis) m25.561 What was this (referring dx) caused by? [x]  Surgery []  Fall []  Ongoing issue []  Arthritis []  Other: ____________  Laterality: [x]  Rt []  Lt []  Both  Check all possible CPT codes:  *CHOOSE 10 OR LESS*    See Planned Interventions listed in the Plan section of the Evaluation.

## 2024-01-24 ENCOUNTER — Ambulatory Visit: Payer: Medicare PPO | Admitting: Physical Therapy

## 2024-01-24 ENCOUNTER — Encounter: Payer: Self-pay | Admitting: Physical Therapy

## 2024-01-24 DIAGNOSIS — M25561 Pain in right knee: Secondary | ICD-10-CM | POA: Diagnosis not present

## 2024-01-24 DIAGNOSIS — M25661 Stiffness of right knee, not elsewhere classified: Secondary | ICD-10-CM | POA: Diagnosis not present

## 2024-01-24 DIAGNOSIS — M6281 Muscle weakness (generalized): Secondary | ICD-10-CM | POA: Diagnosis not present

## 2024-01-24 DIAGNOSIS — R600 Localized edema: Secondary | ICD-10-CM

## 2024-01-24 DIAGNOSIS — R2689 Other abnormalities of gait and mobility: Secondary | ICD-10-CM

## 2024-01-24 NOTE — Therapy (Signed)
OUTPATIENT PHYSICAL THERAPY  TREATMENT   Patient Name: Tamara Hughes MRN: [id number] DOB:1951/11/10, 73 y.o., female Today's Date: 01/24/2024  END OF SESSION:  PT End of Session - 01/24/24 1223     Visit Number 4    Number of Visits 15    Date for PT Re-Evaluation 03/13/24    Authorization Type [employer] MCR    Authorization Time Period -03/13/2024    Authorization - Visit Number 4    Authorization - Number of Visits 12    Progress Note Due on Visit 10    PT Start Time 1145    PT Stop Time 1230    PT Time Calculation (min) 45 min    Activity Tolerance Patient tolerated treatment well    Behavior During Therapy WFL for tasks assessed/performed                Past Medical History:  Diagnosis Date   Allergy    Anxiety    Arthritis    Depression    Difficult intubation    pt states she was told it was hard to intubate her due to "it not being straight." This was only told to her during her hysteroscopy   Dyspnea    pt states this is usually due to weather, and she may have to use her inhaler (seasonal allergies)   Family history of breast cancer    Hyperlipidemia    Osteoarthritis of right knee    Varicose vein of leg    RLE   Past Surgical History:  Procedure Laterality Date   GUM SURGERY     HYSTEROSCOPY     KNEE SURGERY Right    TOTAL KNEE ARTHROPLASTY Right 12/31/2023   Procedure: RIGHT TOTAL KNEE ARTHROPLASTY;  Surgeon: Tarry Kos, MD;  Location: [facility] OR;  Service: Orthopedics;  Laterality: Right;   Patient Active Problem List   Diagnosis Date Noted   Primary osteoarthritis of right knee 12/31/2023   Status post total right knee replacement 12/31/2023   OA (osteoarthritis) of knee 12/31/2023   Pre-operative clearance 11/08/2023   Varicose veins of both legs with edema 09/16/2018   Depression, major, single episode, complete remission (HCC) 09/24/2017   Routine general medical examination at a health care facility 09/24/2017   Bilateral temporomandibular  joint pain 04/06/2016   Disorder of left eustachian tube 04/06/2016   Otalgia of both ears 04/06/2016   Family history of breast cancer    Allergic rhinitis 07/04/2007    PCP: Shirline Frees, NP   REFERRING PROVIDER: Roda Shutters, MD  REFERRING DIAG: S/P total knee arthroplasty, right  - Primary [phone]    THERAPY DIAG:  Acute pain of right knee  Stiffness of right knee, not elsewhere classified  Muscle weakness (generalized)  Other abnormalities of gait and mobility  Localized edema  Rationale for Evaluation and Treatment: Rehabilitation  ONSET DATE: 12/31/2023; s/p R TKA  SUBJECTIVE:   SUBJECTIVE STATEMENT: Knee pain is not bad, its more stiffness than pain  PERTINENT HISTORY: Anxiety and depression, Hyperlipidemia, Vitamin D deficiency, Varicose veins of both legs with edema (2019)   PAIN:  NPRS scale:2/10 with movement today Pain location: anterior medial Rt knee Pain description: tender, swollen, pain is not constant Aggravating factors: overdoing it with activity and CPM Relieving factors: ice, elevation, movment  PRECAUTIONS: Knee  WEIGHT BEARING RESTRICTIONS: No  FALLS:  Has patient fallen in last 6 months? No  LIVING ENVIRONMENT: 4 steps to enter house, with handrail on Right  OCCUPATION: retired  PLOF: Independent, did not use AD   PATIENT GOALS: walk without the walkder, want to be able to squat  Next MD visit: none scheduled yet, saw Dr. this morning at 10am  OBJECTIVE:  DIAGNOSTIC FINDINGS:  Interval total right knee arthroplasty. No perihardware lucency is seen to indicate hardware failure or loosening. Mild-to-moderate joint effusion. Expected postoperative intra-articular and anterior subcutaneous air. No acute fracture or dislocation.   IMPRESSION: Interval total right knee arthroplasty without evidence of hardware failure.  PATIENT SURVEYS:  01/17/2024 FOTO intake:58% functional    predicted:  69  COGNITION: 01/17/2024 Overall  cognitive status: [facility]     EDEMA:  01/17/2024 Moderate edema noted in Right knee  LOWER EXTREMITY ROM:   AROM/PROM Right 01/17/2024 Right 01/24/24  Hip flexion    Hip extension    Hip abduction    Hip adduction    Hip internal rotation    Hip external rotation    Knee flexion 65/80 /95  Knee extension 10/8   Ankle dorsiflexion    Ankle plantarflexion    Ankle inversion    Ankle eversion     (Blank rows = not tested)  LOWER EXTREMITY MMT:  MMT Right 01/17/2024   Hip flexion 4   Hip extension    Hip abduction    Hip adduction    Hip internal rotation    Hip external rotation    Knee flexion 4   Knee extension 4   Ankle dorsiflexion    Ankle plantarflexion    Ankle inversion    Ankle eversion     (Blank rows = not tested)   FUNCTIONAL TESTS:  01/17/2024 18 inch chair transfer: needs UE support to stand  GAIT: 01/17/2024 Distance walked: 75 feet Assistive device utilized: Environmental consultant - 2 wheeled Level of assistance: Complete Independence                       TODAY'S TREATMENT:                                                                 DATE: 01/23/2024 Therex: UBE seat 6?, L1 X 8 min full revolutions LE/UE Incline gastroc stretch 30 sec x 3 bilaterally  Step ups in bars 2 UE support, 4 inch step  X10 forward and X 10 lateral leading with Right leg Walking forward in bars no UE support 3 round trips Sidestepping in bars no UE support 3 round trips Leg press DL 50 #1O10 Seated Rt leg LAQ 2 x 15 2# Seated knee flexion stretch 5 sec X 10  Manual: Seated Rt knee flexion mobilization c movement c distraction/IR.  Contract/relax techniques into flexion c contralateral leg moving opposite.   Vasopneumatic 10 mins medium compression Rt knee in elevation.     TODAY'S TREATMENT:                                                                 DATE: 01/22/2024  HEP review Nu step L5 X 6 min seat #8 UE/LE Standing gastroc stretch  on slantboard 30 sec X  3 Standing knee flexion stretch with foot on treadmill and UE support 5 sec X 10 Seated LAQ 2#  2X10 Seated knee flexion AAROM 5 sec X 10 Supine heel prop with quad sets 5 sec  X10  -Manual therapy for Rt knee PROM with overpressure to tolerance into flexion.   -Vasopnuematic device X 10 min, medium compression, 34 deg to Rt knee  TODAY'S TREATMENT:                                                                DATE:  01/17/2024 Eval HEP creation and review with demonstration and trial set preformed, see below for details Rt knee PROM with overpressure to tolerance -Vasopnuematic device X 10 min, medium compression, 34 deg to Rt knee    PATIENT EDUCATION: Education details: HEP, PT plan of care Person educated: Patient Education method: Explanation, Demonstration, Verbal cues, and Handouts Education comprehension: verbalized understanding and needs further education   HOME EXERCISE PROGRAM: Access Code: [email or web address] URL: [email or web address] Date: 01/17/2024 Prepared by: Ivery Quale  Exercises - Supine Heel Slide with Strap  - 3 x daily - 6 x weekly - 1 sets - 10 reps - 5 hold - Supine Active Straight Leg Raise  - 2 x daily - 6 x weekly - 1-2 sets - 10 reps - Supine Knee Extension Stretch on Towel Roll  - 3 x daily - 6 x weekly - 1 sets - 10 reps - 5 sec hold - Seated Knee Flexion AAROM  - 3 x daily - 6 x weekly - 1 sets - 10 reps - 5 hold - Sit to Stand with Armchair  - 2 x daily - 6 x weekly - 2 sets - 5 reps  ASSESSMENT:  CLINICAL IMPRESSION:  She showed improved knee ROM with updated measurement. PT will continue to work to improve knee ROM, strength, and gait to improve function.   OBJECTIVE IMPAIRMENTS: decreased activity tolerance, difficulty walking, decreased balance, decreased endurance, decreased mobility, decreased ROM, decreased strength, impaired flexibility, impaired LE use, postural dysfunction, and pain.  ACTIVITY LIMITATIONS: bending, lifting,  carry, locomotion, cleaning, community activity, driving,  PERSONAL FACTORS: see above PMH are also affecting patient's functional outcome.  REHAB POTENTIAL: Good  CLINICAL DECISION MAKING: Stable/uncomplicated  EVALUATION COMPLEXITY: Low    GOALS: Short term PT Goals Target date: 02/14/2024   Pt will be I and compliant with HEP. Baseline:  Goal status: on going 01/23/2024 Pt will be able to stand from 18 inch chair without UE support Baseline: Goal status: on going 01/23/2024  Long term PT goals Target date:03/13/2024   Pt will improve Rt knee AROM to WFL (0-110 deg) to improve functional mobility Baseline: Goal status: New Pt will improve  hip/knee strength to at least 5-/5 MMT to improve functional strength Baseline: Goal status: New Pt will improve FOTO to at least % functional to show improved function Baseline: Goal status: New Pt will reduce pain to overall less than 2-3/10 with usual activity Baseline: Goal status: New Pt will be able to ambulate community distances at least 500 ft WNL gait pattern and independently without complaints Baseline: Goal status: New  PLAN: PT FREQUENCY: 2-3 times per week   PT DURATION: 6-8 weeks  PLANNED INTERVENTIONS (  unless contraindicated):  97110-Therapeutic exercises, 97530- Therapeutic activity, [id number]- Neuromuscular re-education, 97535- Self Care, 65784- Manual therapy, [id number]- Gait training, [id number]- Aquatic Therapy, 97016- Vasopneumatic device, [id number]- Ultrasound, and Balance training  PLAN FOR NEXT SESSION: ROM gains, strengthening, balance progression.   Ivery Quale, PT, DPT 01/24/24 12:24 PM    Referring diagnosis? S/P total knee arthroplasty, right  - Primary [phone]  Treatment diagnosis? (if different than referring diagnosis) m25.561 What was this (referring dx) caused by? [x]  Surgery []  Fall []  Ongoing issue []  Arthritis []  Other: ____________  Laterality: [x]  Rt []  Lt []  Both  Check all possible CPT  codes:  *CHOOSE 10 OR LESS*    See Planned Interventions listed in the Plan section of the Evaluation.

## 2024-01-30 ENCOUNTER — Encounter: Payer: Self-pay | Admitting: Physical Therapy

## 2024-01-30 ENCOUNTER — Ambulatory Visit: Payer: Medicare PPO | Admitting: Physical Therapy

## 2024-01-30 DIAGNOSIS — M25661 Stiffness of right knee, not elsewhere classified: Secondary | ICD-10-CM

## 2024-01-30 DIAGNOSIS — R600 Localized edema: Secondary | ICD-10-CM

## 2024-01-30 DIAGNOSIS — M6281 Muscle weakness (generalized): Secondary | ICD-10-CM

## 2024-01-30 DIAGNOSIS — M25561 Pain in right knee: Secondary | ICD-10-CM

## 2024-01-30 DIAGNOSIS — R2689 Other abnormalities of gait and mobility: Secondary | ICD-10-CM

## 2024-01-30 NOTE — Therapy (Signed)
 OUTPATIENT PHYSICAL THERAPY  TREATMENT   Patient Name: Tamara Hughes MRN: [id number] DOB:11/17/1951, 73 y.o., female Today's Date: 01/30/2024  END OF SESSION:  PT End of Session - 01/30/24 1152     Visit Number 5    Number of Visits 15    Date for PT Re-Evaluation 03/13/24    Authorization Type [employer] MCR    Authorization Time Period -03/13/2024    Authorization - Number of Visits 12    Progress Note Due on Visit 10    PT Start Time 1148    PT Stop Time 1236    PT Time Calculation (min) 48 min    Activity Tolerance Patient tolerated treatment well    Behavior During Therapy WFL for tasks assessed/performed                 Past Medical History:  Diagnosis Date   Allergy    Anxiety    Arthritis    Depression    Difficult intubation    pt states she was told it was hard to intubate her due to it not being straight. This was only told to her during her hysteroscopy   Dyspnea    pt states this is usually due to weather, and she may have to use her inhaler (seasonal allergies)   Family history of breast cancer    Hyperlipidemia    Osteoarthritis of right knee    Varicose vein of leg    RLE   Past Surgical History:  Procedure Laterality Date   GUM SURGERY     HYSTEROSCOPY     KNEE SURGERY Right    TOTAL KNEE ARTHROPLASTY Right 12/31/2023   Procedure: RIGHT TOTAL KNEE ARTHROPLASTY;  Surgeon: Jerri Kay HERO, MD;  Location: [facility] OR;  Service: Orthopedics;  Laterality: Right;   Patient Active Problem List   Diagnosis Date Noted   Primary osteoarthritis of right knee 12/31/2023   Status post total right knee replacement 12/31/2023   OA (osteoarthritis) of knee 12/31/2023   Pre-operative clearance 11/08/2023   Varicose veins of both legs with edema 09/16/2018   Depression, major, single episode, complete remission (HCC) 09/24/2017   Routine general medical examination at a health care facility 09/24/2017   Bilateral temporomandibular joint pain 04/06/2016   Disorder of  left eustachian tube 04/06/2016   Otalgia of both ears 04/06/2016   Family history of breast cancer    Allergic rhinitis 07/04/2007    PCP: Merna Huxley, NP   REFERRING PROVIDER: Jerri, MD  REFERRING DIAG: S/P total knee arthroplasty, right  - Primary [phone]    THERAPY DIAG:  Acute pain of right knee  Stiffness of right knee, not elsewhere classified  Muscle weakness (generalized)  Other abnormalities of gait and mobility  Localized edema  Rationale for Evaluation and Treatment: Rehabilitation  ONSET DATE: 12/31/2023; s/p R TKA  SUBJECTIVE:   SUBJECTIVE STATEMENT: Knee is stiff but improving   PERTINENT HISTORY: Anxiety and depression, Hyperlipidemia, Vitamin D  deficiency, Varicose veins of both legs with edema (2019)   PAIN:  NPRS scale:2/10 with movement today Pain location: anterior medial Rt knee Pain description: tender, swollen, pain is not constant Aggravating factors: overdoing it with activity and CPM Relieving factors: ice, elevation, movment  PRECAUTIONS: Knee  WEIGHT BEARING RESTRICTIONS: No  FALLS:  Has patient fallen in last 6 months? No  LIVING ENVIRONMENT: 4 steps to enter house, with handrail on Right  OCCUPATION: retired  PLOF: Independent, did not use AD   PATIENT GOALS:  walk without the walkder, want to be able to squat  Next MD visit: none scheduled yet, saw Dr. this morning at 10am  OBJECTIVE:  DIAGNOSTIC FINDINGS:  Interval total right knee arthroplasty. No perihardware lucency is seen to indicate hardware failure or loosening. Mild-to-moderate joint effusion. Expected postoperative intra-articular and anterior subcutaneous air. No acute fracture or dislocation.   IMPRESSION: Interval total right knee arthroplasty without evidence of hardware failure.  PATIENT SURVEYS:  01/17/2024 FOTO intake:58% functional    predicted:  69  COGNITION: 01/17/2024 Overall cognitive status: [facility]     EDEMA:  01/17/2024 Moderate edema  noted in Right knee  LOWER EXTREMITY ROM:   AROM/PROM Right 01/17/2024 Right 01/24/24 Right 01/30/24  Knee flexion 65/80 /95 85/93  Knee extension 10/8     (Blank rows = not tested)  LOWER EXTREMITY MMT:  MMT Right 01/17/2024   Hip flexion 4   Knee flexion 4   Knee extension 4    (Blank rows = not tested)   FUNCTIONAL TESTS:  01/17/2024 18 inch chair transfer: needs UE support to stand  GAIT: 01/17/2024 Distance walked: 75 feet Assistive device utilized: Environmental Consultant - 2 wheeled Level of assistance: Complete Independence                       TODAY'S TREATMENT DATE 01/30/24 TherEx SciFit bike seat 8 x 8 min; L1 full revolutions  TherAct (to facilitate function mobility including transfers, stairs, etc) Leg press bil 3x10; 50#; RLE only 25# 3x10 Forward step ups onto 6 step x 10 reps bil; intermittent UE support Lateral step ups onto 6 step x 10 reps bil Amb within the clinic with [facility] then no device; no evidence of instability noted  Vasopneumatic 10 mins medium compression Rt knee in elevation.    01/23/2024 Therex: UBE seat 6?, L1 X 8 min full revolutions LE/UE Incline gastroc stretch 30 sec x 3 bilaterally  Step ups in bars 2 UE support, 4 inch step  X10 forward and X 10 lateral leading with Right leg Walking forward in bars no UE support 3 round trips Sidestepping in bars no UE support 3 round trips Leg press DL 50 [id number] Seated Rt leg LAQ 2 x 15 2# Seated knee flexion stretch 5 sec X 10  Manual: Seated Rt knee flexion mobilization c movement c distraction/IR.  Contract/relax techniques into flexion c contralateral leg moving opposite.   Vasopneumatic 10 mins medium compression Rt knee in elevation.     01/22/2024  HEP review Nu step L5 X 6 min seat #8 UE/LE Standing gastroc stretch on slantboard 30 sec X 3 Standing knee flexion stretch with foot on treadmill and UE support 5 sec X 10 Seated LAQ 2#  2X10 Seated knee flexion AAROM 5 sec X 10 Supine heel  prop with quad sets 5 sec  X10  -Manual therapy for Rt knee PROM with overpressure to tolerance into flexion.   -Vasopnuematic device X 10 min, medium compression, 34 deg to Rt knee    PATIENT EDUCATION: Education details: HEP, PT plan of care Person educated: Patient Education method: Explanation, Demonstration, Verbal cues, and Handouts Education comprehension: verbalized understanding and needs further education   HOME EXERCISE PROGRAM: Access Code: [email or web address] URL: [email or web address] Date: 01/17/2024 Prepared by: Redell Moose  Exercises - Supine Heel Slide with Strap  - 3 x daily - 6 x weekly - 1 sets - 10 reps - 5 hold - Supine Active Straight Leg Raise  - 2 x  daily - 6 x weekly - 1-2 sets - 10 reps - Supine Knee Extension Stretch on Towel Roll  - 3 x daily - 6 x weekly - 1 sets - 10 reps - 5 sec hold - Seated Knee Flexion AAROM  - 3 x daily - 6 x weekly - 1 sets - 10 reps - 5 hold - Sit to Stand with Armchair  - 2 x daily - 6 x weekly - 2 sets - 5 reps  ASSESSMENT:  CLINICAL IMPRESSION:  Good improvement in AROM today and overall progressing well.  Able to progress away from RW today and can use cane PRN or no device.   OBJECTIVE IMPAIRMENTS: decreased activity tolerance, difficulty walking, decreased balance, decreased endurance, decreased mobility, decreased ROM, decreased strength, impaired flexibility, impaired LE use, postural dysfunction, and pain.  ACTIVITY LIMITATIONS: bending, lifting, carry, locomotion, cleaning, community activity, driving,  PERSONAL FACTORS: see above PMH are also affecting patient's functional outcome.  REHAB POTENTIAL: Good  CLINICAL DECISION MAKING: Stable/uncomplicated  EVALUATION COMPLEXITY: Low    GOALS: Short term PT Goals Target date: 02/14/2024   Pt will be I and compliant with HEP. Baseline:  Goal status: on going 01/23/2024 Pt will be able to stand from 18 inch chair without UE support Baseline: Goal  status: on going 01/23/2024  Long term PT goals Target date:03/13/2024   Pt will improve Rt knee AROM to WFL (0-110 deg) to improve functional mobility Baseline: Goal status: New Pt will improve  hip/knee strength to at least 5-/5 MMT to improve functional strength Baseline: Goal status: New Pt will improve FOTO to at least % functional to show improved function Baseline: Goal status: New Pt will reduce pain to overall less than 2-3/10 with usual activity Baseline: Goal status: New Pt will be able to ambulate community distances at least 500 ft WNL gait pattern and independently without complaints Baseline: Goal status: New  PLAN: PT FREQUENCY: 2-3 times per week   PT DURATION: 6-8 weeks  PLANNED INTERVENTIONS (unless contraindicated):  97110-Therapeutic exercises, 97530- Therapeutic activity, [id number]- Neuromuscular re-education, 97535- Self Care, 02859- Manual therapy, [id number]- Gait training, [id number]- Aquatic Therapy, [id number]- Vasopneumatic device, 97035- Ultrasound, and Balance training  PLAN FOR NEXT SESSION: continue ROM gains, strengthening, balance progression.   Corean JULIANNA Ku, PT, DPT 01/30/24 12:58 PM     Referring diagnosis? S/P total knee arthroplasty, right  - Primary [phone]  Treatment diagnosis? (if different than referring diagnosis) m25.561 What was this (referring dx) caused by? [x]  Surgery []  Fall []  Ongoing issue []  Arthritis []  Other: ____________  Laterality: [x]  Rt []  Lt []  Both  Check all possible CPT codes:  *CHOOSE 10 OR LESS*    See Planned Interventions listed in the Plan section of the Evaluation.

## 2024-01-31 ENCOUNTER — Encounter: Payer: Self-pay | Admitting: Physical Therapy

## 2024-01-31 ENCOUNTER — Ambulatory Visit: Payer: Medicare PPO | Admitting: Physical Therapy

## 2024-01-31 DIAGNOSIS — M25661 Stiffness of right knee, not elsewhere classified: Secondary | ICD-10-CM | POA: Diagnosis not present

## 2024-01-31 DIAGNOSIS — R600 Localized edema: Secondary | ICD-10-CM | POA: Diagnosis not present

## 2024-01-31 DIAGNOSIS — M6281 Muscle weakness (generalized): Secondary | ICD-10-CM

## 2024-01-31 DIAGNOSIS — R2689 Other abnormalities of gait and mobility: Secondary | ICD-10-CM | POA: Diagnosis not present

## 2024-01-31 DIAGNOSIS — M25561 Pain in right knee: Secondary | ICD-10-CM | POA: Diagnosis not present

## 2024-01-31 NOTE — Therapy (Signed)
 OUTPATIENT PHYSICAL THERAPY  TREATMENT   Patient Name: Tamara Hughes MRN: [id number] DOB:[birth year], 73 y.o., female Today's Date: 01/31/2024  END OF SESSION:  PT End of Session - 01/31/24 1356     Visit Number 6    Number of Visits 15    Date for PT Re-Evaluation 03/13/24    Authorization Type [employer] MCR    Authorization Time Period -03/13/2024    Authorization - Visit Number 5    Authorization - Number of Visits 12    Progress Note Due on Visit 10    PT Start Time 1345    PT Stop Time 1433    PT Time Calculation (min) 48 min    Activity Tolerance Patient tolerated treatment well    Behavior During Therapy WFL for tasks assessed/performed                 Past Medical History:  Diagnosis Date   Allergy    Anxiety    Arthritis    Depression    Difficult intubation    pt states she was told it was hard to intubate her due to it not being straight. This was only told to her during her hysteroscopy   Dyspnea    pt states this is usually due to weather, and she may have to use her inhaler (seasonal allergies)   Family history of breast cancer    Hyperlipidemia    Osteoarthritis of right knee    Varicose vein of leg    RLE   Past Surgical History:  Procedure Laterality Date   GUM SURGERY     HYSTEROSCOPY     KNEE SURGERY Right    TOTAL KNEE ARTHROPLASTY Right 12/31/2023   Procedure: RIGHT TOTAL KNEE ARTHROPLASTY;  Surgeon: Jerri Kay HERO, MD;  Location: [facility] OR;  Service: Orthopedics;  Laterality: Right;   Patient Active Problem List   Diagnosis Date Noted   Primary osteoarthritis of right knee 12/31/2023   Status post total right knee replacement 12/31/2023   OA (osteoarthritis) of knee 12/31/2023   Pre-operative clearance 11/08/2023   Varicose veins of both legs with edema 09/16/2018   Depression, major, single episode, complete remission (HCC) 09/24/2017   Routine general medical examination at a health care facility 09/24/2017   Bilateral temporomandibular  joint pain 04/06/2016   Disorder of left eustachian tube 04/06/2016   Otalgia of both ears 04/06/2016   Family history of breast cancer    Allergic rhinitis 07/04/2007    PCP: Merna Huxley, NP   REFERRING PROVIDER: Jerri, MD  REFERRING DIAG: S/P total knee arthroplasty, right  - Primary [phone]    THERAPY DIAG:  Acute pain of right knee  Stiffness of right knee, not elsewhere classified  Muscle weakness (generalized)  Other abnormalities of gait and mobility  Localized edema  Rationale for Evaluation and Treatment: Rehabilitation  ONSET DATE: 12/31/2023; s/p R TKA  SUBJECTIVE:   SUBJECTIVE STATEMENT: Knees are sore after last visit, feels it was the leg press.    PERTINENT HISTORY: Anxiety and depression, Hyperlipidemia, Vitamin D  deficiency, Varicose veins of both legs with edema (2019)   PAIN:  NPRS scale:its more soreness than pain today Pain location: anterior medial Rt knee Pain description: tender, swollen, pain is not constant Aggravating factors: overdoing it with activity and CPM Relieving factors: ice, elevation, movment  PRECAUTIONS: Knee  WEIGHT BEARING RESTRICTIONS: No  FALLS:  Has patient fallen in last 6 months? No  LIVING ENVIRONMENT: 4 steps to enter house,  with handrail on Right  OCCUPATION: retired  PLOF: Independent, did not use AD   PATIENT GOALS: walk without the walkder, want to be able to squat  Next MD visit: none scheduled yet, saw Dr. this morning at 10am  OBJECTIVE:  DIAGNOSTIC FINDINGS:  Interval total right knee arthroplasty. No perihardware lucency is seen to indicate hardware failure or loosening. Mild-to-moderate joint effusion. Expected postoperative intra-articular and anterior subcutaneous air. No acute fracture or dislocation.   IMPRESSION: Interval total right knee arthroplasty without evidence of hardware failure.  PATIENT SURVEYS:  01/17/2024 FOTO intake:58% functional    predicted:   69  COGNITION: 01/17/2024 Overall cognitive status: [facility]     EDEMA:  01/17/2024 Moderate edema noted in Right knee  LOWER EXTREMITY ROM:   AROM/PROM Right 01/17/2024 Right 01/24/24 Right 01/30/24  Knee flexion 65/80 /95 85/93  Knee extension 10/8     (Blank rows = not tested)  LOWER EXTREMITY MMT:  MMT Right 01/17/2024   Hip flexion 4   Knee flexion 4   Knee extension 4    (Blank rows = not tested)   FUNCTIONAL TESTS:  01/17/2024 18 inch chair transfer: needs UE support to stand  GAIT: 01/17/2024 Distance walked: 75 feet Assistive device utilized: Environmental Consultant - 2 wheeled Level of assistance: Complete Independence                       TODAY'S TREATMENT DATE 01/31/24 TherEx SciFit bike seat 8 x 8 min; L1 full revolutions Gastroc stretch 30 sec X 3 bilat on slantboard, then one more rep off step to show her how to simulate this at home  TherAct (to facilitate function mobility including transfers, stairs, etc) Sit to stands no UE support from slightly raised table X 10 Forward step ups onto 6 step x 10 reps bil; intermittent UE support Lateral step ups onto 6 step x 10 reps bil Walking in bars no UE support forward and backward 3 trips each Sidestepping in bars without UE support 3 round trips Amb within the clinic with [facility] then no device; no evidence of instability noted  Manual Rt knee PROM with overpressure to tolerance seated flexion and supine extension with extension mobs grade 3-4  Vasopneumatic 10 mins medium compression Rt knee in elevation.   01/30/24 TherEx SciFit bike seat 8 x 8 min; L1 full revolutions   TherAct (to facilitate function mobility including transfers, stairs, etc) Leg press bil 3x10; 50#; RLE only 25# 3x10 Forward step ups onto 6 step x 10 reps bil; intermittent UE support Lateral step ups onto 6 step x 10 reps bil Amb within the clinic with [facility] then no device; no evidence of instability noted   Vasopneumatic 10 mins medium  compression Rt knee in elevation.    01/23/2024 Therex: UBE seat 6?, L1 X 8 min full revolutions LE/UE Incline gastroc stretch 30 sec x 3 bilaterally  Step ups in bars 2 UE support, 4 inch step  X10 forward and X 10 lateral leading with Right leg Walking forward in bars no UE support 3 round trips Sidestepping in bars no UE support 3 round trips Leg press DL 50 [id number] Seated Rt leg LAQ 2 x 15 2# Seated knee flexion stretch 5 sec X 10  Manual: Seated Rt knee flexion mobilization c movement c distraction/IR.  Contract/relax techniques into flexion c contralateral leg moving opposite.   Vasopneumatic 10 mins medium compression Rt knee in elevation.     01/22/2024  HEP  review Nu step L5 X 6 min seat #8 UE/LE Standing gastroc stretch on slantboard 30 sec X 3 Standing knee flexion stretch with foot on treadmill and UE support 5 sec X 10 Seated LAQ 2#  2X10 Seated knee flexion AAROM 5 sec X 10 Supine heel prop with quad sets 5 sec  X10  -Manual therapy for Rt knee PROM with overpressure to tolerance into flexion.   -Vasopnuematic device X 10 min, medium compression, 34 deg to Rt knee    PATIENT EDUCATION: Education details: HEP, PT plan of care Person educated: Patient Education method: Explanation, Demonstration, Verbal cues, and Handouts Education comprehension: verbalized understanding and needs further education   HOME EXERCISE PROGRAM: Access Code: [email or web address] URL: [email or web address] Date: 01/17/2024 Prepared by: Redell Moose  Exercises - Supine Heel Slide with Strap  - 3 x daily - 6 x weekly - 1 sets - 10 reps - 5 hold - Supine Active Straight Leg Raise  - 2 x daily - 6 x weekly - 1-2 sets - 10 reps - Supine Knee Extension Stretch on Towel Roll  - 3 x daily - 6 x weekly - 1 sets - 10 reps - 5 sec hold - Seated Knee Flexion AAROM  - 3 x daily - 6 x weekly - 1 sets - 10 reps - 5 hold - Sit to Stand with Armchair  - 2 x daily - 6 x weekly - 2 sets - 5  reps  ASSESSMENT:  CLINICAL IMPRESSION:  Good improvement in AROM today and overall progressing well.  Able to progress away from RW today and can use cane PRN or no device.   OBJECTIVE IMPAIRMENTS: decreased activity tolerance, difficulty walking, decreased balance, decreased endurance, decreased mobility, decreased ROM, decreased strength, impaired flexibility, impaired LE use, postural dysfunction, and pain.  ACTIVITY LIMITATIONS: bending, lifting, carry, locomotion, cleaning, community activity, driving,  PERSONAL FACTORS: see above PMH are also affecting patient's functional outcome.  REHAB POTENTIAL: Good  CLINICAL DECISION MAKING: Stable/uncomplicated  EVALUATION COMPLEXITY: Low    GOALS: Short term PT Goals Target date: 02/14/2024   Pt will be I and compliant with HEP. Baseline:  Goal status: on going 01/23/2024 Pt will be able to stand from 18 inch chair without UE support Baseline: Goal status: on going 01/23/2024  Long term PT goals Target date:03/13/2024   Pt will improve Rt knee AROM to WFL (0-110 deg) to improve functional mobility Baseline: Goal status: New Pt will improve  hip/knee strength to at least 5-/5 MMT to improve functional strength Baseline: Goal status: New Pt will improve FOTO to at least % functional to show improved function Baseline: Goal status: New Pt will reduce pain to overall less than 2-3/10 with usual activity Baseline: Goal status: New Pt will be able to ambulate community distances at least 500 ft WNL gait pattern and independently without complaints Baseline: Goal status: New  PLAN: PT FREQUENCY: 2-3 times per week   PT DURATION: 6-8 weeks  PLANNED INTERVENTIONS (unless contraindicated):  97110-Therapeutic exercises, 97530- Therapeutic activity, [id number]- Neuromuscular re-education, 97535- Self Care, 02859- Manual therapy, [id number]- Gait training, [id number]- Aquatic Therapy, [id number]- Vasopneumatic device, 97035- Ultrasound, and  Balance training  PLAN FOR NEXT SESSION: continue ROM gains, strengthening, balance/gait progression.   Redell Moose, PT, DPT 01/31/24 2:23 PM      Referring diagnosis? S/P total knee arthroplasty, right  - Primary [phone]  Treatment diagnosis? (if different than referring diagnosis) m25.561 What was this (referring dx) caused by? [x]  Surgery []  Fall []   Ongoing issue []  Arthritis []  Other: ____________  Laterality: [x]  Rt []  Lt []  Both  Check all possible CPT codes:  *CHOOSE 10 OR LESS*    See Planned Interventions listed in the Plan section of the Evaluation.

## 2024-02-01 ENCOUNTER — Ambulatory Visit: Payer: Medicare PPO | Admitting: Rehabilitative and Restorative Service Providers"

## 2024-02-01 ENCOUNTER — Encounter: Payer: Self-pay | Admitting: Rehabilitative and Restorative Service Providers"

## 2024-02-01 DIAGNOSIS — M25561 Pain in right knee: Secondary | ICD-10-CM | POA: Diagnosis not present

## 2024-02-01 DIAGNOSIS — R600 Localized edema: Secondary | ICD-10-CM | POA: Diagnosis not present

## 2024-02-01 DIAGNOSIS — M6281 Muscle weakness (generalized): Secondary | ICD-10-CM

## 2024-02-01 DIAGNOSIS — M25661 Stiffness of right knee, not elsewhere classified: Secondary | ICD-10-CM

## 2024-02-01 DIAGNOSIS — R2689 Other abnormalities of gait and mobility: Secondary | ICD-10-CM | POA: Diagnosis not present

## 2024-02-01 NOTE — Therapy (Signed)
 OUTPATIENT PHYSICAL THERAPY  TREATMENT   Patient Name: Tamara [facility] MRN: [id number] DOB:[birth year], 73 y.o., female Today's Date: 02/01/2024  END OF SESSION:  PT End of Session - 02/01/24 1106     Visit Number 7    Number of Visits 15    Date for PT Re-Evaluation 03/13/24    Authorization Type [employer] MCR    Authorization Time Period -03/13/2024    Authorization - Number of Visits 12    Progress Note Due on Visit 10    PT Start Time 1058    PT Stop Time 1153    PT Time Calculation (min) 55 min    Activity Tolerance Patient tolerated treatment well;No increased pain;Patient limited by pain    Behavior During Therapy [facility] for tasks assessed/performed             Past Medical History:  Diagnosis Date   Allergy    Anxiety    Arthritis    Depression    Difficult intubation    pt states she was told it was hard to intubate her due to it not being straight. This was only told to her during her hysteroscopy   Dyspnea    pt states this is usually due to weather, and she may have to use her inhaler (seasonal allergies)   Family history of breast cancer    Hyperlipidemia    Osteoarthritis of right knee    Varicose vein of leg    RLE   Past Surgical History:  Procedure Laterality Date   GUM SURGERY     HYSTEROSCOPY     KNEE SURGERY Right    TOTAL KNEE ARTHROPLASTY Right 12/31/2023   Procedure: RIGHT TOTAL KNEE ARTHROPLASTY;  Surgeon: Tamara Kay HERO, MD;  [facility];  Service: Orthopedics;  Laterality: Right;   Patient Active Problem List   Diagnosis Date Noted   Primary osteoarthritis of right knee 12/31/2023   Status post total right knee replacement 12/31/2023   OA (osteoarthritis) of knee 12/31/2023   Pre-operative clearance 11/08/2023   Varicose veins of both legs with edema 09/16/2018   Depression, major, single episode, complete remission (HCC) 09/24/2017   Routine general medical examination at a health care facility 09/24/2017   Bilateral temporomandibular  joint pain 04/06/2016   Disorder of left eustachian tube 04/06/2016   Otalgia of both ears 04/06/2016   Family history of breast cancer    Allergic rhinitis 07/04/2007    PCP: Tamara Huxley, NP   REFERRING PROVIDER: Jerri, MD  REFERRING DIAG: S/P total knee arthroplasty, right  - Primary [phone]    THERAPY DIAG:  Acute pain of right knee  Stiffness of right knee, not elsewhere classified  Muscle weakness (generalized)  Other abnormalities of gait and mobility  Localized edema  Rationale for Evaluation and Treatment: Rehabilitation  ONSET DATE: 12/31/2023; s/p R TKA  SUBJECTIVE:   SUBJECTIVE STATEMENT: [name] reports good HEP compliance.  Today is the first day she has attended PT without the walker.  PERTINENT HISTORY: Anxiety and depression, Hyperlipidemia, Vitamin D  deficiency, Varicose veins of both legs with edema (2019)   PAIN:  NPRS scale: 1-6/10 this week Pain location: anterior medial Rt knee Pain description: tender, swollen, pain is not constant Aggravating factors: overdoing it with activity and CPM Relieving factors: ice, elevation, movment  PRECAUTIONS: Knee  WEIGHT BEARING RESTRICTIONS: No  FALLS:  Has patient fallen in last 6 months? No  LIVING ENVIRONMENT: 4 steps to enter house, with handrail on Right  OCCUPATION: retired  PLOF: Independent, did not use AD   PATIENT GOALS: walk without the walkder, want to be able to squat  Next MD visit: none scheduled yet, saw [facility]. this morning at 10am  OBJECTIVE:  DIAGNOSTIC FINDINGS:  Interval total right knee arthroplasty. No perihardware lucency is seen to indicate hardware failure Hughes loosening. Mild-to-moderate joint effusion. Expected postoperative intra-articular and anterior subcutaneous air. No acute fracture Hughes dislocation.   IMPRESSION: Interval total right knee arthroplasty without evidence of hardware failure.  PATIENT SURVEYS:  01/17/2024 FOTO intake:58% functional    predicted:   69  COGNITION: 01/17/2024 Overall cognitive status: [facility]    EDEMA:  01/17/2024 Moderate edema noted in Right knee  LOWER EXTREMITY ROM:   AROM/PROM Right 01/17/2024 Right 01/24/24 Right 01/30/24 Right 02/01/2024  Knee flexion 65/80 /95 85/93 Active 98  Knee extension 10/8   Active 5   (Blank rows = not tested)  LOWER EXTREMITY MMT:  MMT Right 01/17/2024   Hip flexion 4   Knee flexion 4   Knee extension 4    (Blank rows = not tested)   FUNCTIONAL TESTS:  01/17/2024 18 inch chair transfer: needs UE support to stand  GAIT: 01/17/2024 Distance walked: 75 feet Assistive device utilized: Environmental Consultant - 2 wheeled Level of assistance: Complete Independence                       TODAY'S TREATMENT DATE 02/01/2024 Recumbent bike Seat 6 & 5 3 minutes each for AAROM Tailgate knee flexion 1 minute Seated knee flexion AAROM 10 x 10 seconds  Quad sets with heel prop 2 sets of 10 for 5 seconds Supine knee flexion with strap 10 x 10 seconds  Functional Activities: Double Leg Press 50# 15 x with full extension and stretch into flexion for sit to stand  Single Leg Press 25# 10 x with full extension and stretch into flexion for sit to stand   Vaso right knee Medium 34* 10 minutes   01/31/24 TherEx SciFit bike seat 8 x 8 min; L1 full revolutions Gastroc stretch 30 sec X 3 bilat on slantboard, then one more rep off step to show her how to simulate this at home  TherAct (to facilitate function mobility including transfers, stairs, etc) Sit to stands no UE support from slightly raised table X 10 Forward step ups onto 6 step x 10 reps bil; intermittent UE support Lateral step ups onto 6 step x 10 reps bil Walking in bars no UE support forward and backward 3 trips each Sidestepping in bars without UE support 3 round trips Amb within the clinic with [facility] then no device; no evidence of instability noted  Manual Rt knee PROM with overpressure to tolerance seated flexion and supine extension  with extension mobs grade 3-4  Vasopneumatic 10 mins medium compression Rt knee in elevation.    01/30/24 TherEx SciFit bike seat 8 x 8 min; L1 full revolutions   TherAct (to facilitate function mobility including transfers, stairs, etc) Leg press bil 3x10; 50#; RLE only 25# 3x10 Forward step ups onto 6 step x 10 reps bil; intermittent UE support Lateral step ups onto 6 step x 10 reps bil Amb within the clinic with [facility] then no device; no evidence of instability noted   Vasopneumatic 10 mins medium compression Rt knee in elevation.    PATIENT EDUCATION: Education details: HEP, PT plan of care Person educated: Patient Education method: Explanation, Demonstration, Verbal cues, and Handouts Education comprehension: verbalized understanding  and needs further education   HOME EXERCISE PROGRAM: Access Code: [email or web address] URL: [email or web address] Date: 01/17/2024 Prepared by: Redell Moose  Exercises - Supine Heel Slide with Strap  - 3 x daily - 6 x weekly - 1 sets - 10 reps - 5 hold - Supine Active Straight Leg Raise  - 2 x daily - 6 x weekly - 1-2 sets - 10 reps - Supine Knee Extension Stretch on Towel Roll  - 3 x daily - 6 x weekly - 1 sets - 10 reps - 5 sec hold - Seated Knee Flexion AAROM  - 3 x daily - 6 x weekly - 1 sets - 10 reps - 5 hold - Sit to Stand with Armchair  - 2 x daily - 6 x weekly - 2 sets - 5 reps  ASSESSMENT:  CLINICAL IMPRESSION:  Corianne did a good job with her supervised PT today.  AROM is 5 - 0 - 98 degrees.  She is starting to go without the walker and was encouraged to go back to an assistive device if edema increases.  Extension and flexion AROM, quadriceps strength, edema and function will benefit from the recommended plan of care.  OBJECTIVE IMPAIRMENTS: decreased activity tolerance, difficulty walking, decreased balance, decreased endurance, decreased mobility, decreased ROM, decreased strength, impaired flexibility, impaired LE use,  postural dysfunction, and pain.  ACTIVITY LIMITATIONS: bending, lifting, carry, locomotion, cleaning, community activity, driving,  PERSONAL FACTORS: see above PMH are also affecting patient's functional outcome.  REHAB POTENTIAL: Good  CLINICAL DECISION MAKING: Stable/uncomplicated  EVALUATION COMPLEXITY: Low    GOALS: Short term PT Goals Target date: 02/14/2024   Pt will be I and compliant with HEP. Baseline:  Goal status: Met 02/01/2024 Pt will be able to stand from 18 inch chair without UE support Baseline: Goal status: on going 01/23/2024  Long term PT goals Target date:03/13/2024   Pt will improve Rt knee AROM to [facility] (0-110 deg) to improve functional mobility Baseline: Goal status: On Going 02/01/2024 Pt will improve  hip/knee strength to at least 5-/5 MMT to improve functional strength Baseline: Goal status: New Pt will improve FOTO to at least % functional to show improved function Baseline: Goal status: New Pt will reduce pain to overall less than 2-3/10 with usual activity Baseline: Goal status: On Going 02/01/2024 Pt will be able to ambulate community distances at least 500 ft WNL gait pattern and independently without complaints Baseline: Goal status: On Going 02/01/2024  PLAN: PT FREQUENCY: 2-3 times per week   PT DURATION: 6-8 weeks  PLANNED INTERVENTIONS (unless contraindicated):  97110-Therapeutic exercises, 97530- Therapeutic activity, 97112- Neuromuscular re-education, 97535- Self Care, [id number]- Manual therapy, [id number]- Gait training, [id number]- Aquatic Therapy, [id number]- Vasopneumatic device, 97035- Ultrasound, and Balance training  PLAN FOR NEXT SESSION: Continue ROM gains, quadriceps strengthening, balance/gait progressions and edema control.   Myer LELON Ivory PT, MPT 02/01/24 12:49 PM      Referring diagnosis? S/P total knee arthroplasty, right  - Primary [phone]  Treatment diagnosis? (if different than referring diagnosis) m25.561 What was this (referring  dx) caused by? [x]  Surgery []  Fall []  Ongoing issue []  Arthritis []  Other: ____________  Laterality: [x]  Rt []  Lt []  Both  Check all possible CPT codes:  *CHOOSE 10 Hughes LESS*    See Planned Interventions listed in the Plan section of the Evaluation.

## 2024-02-05 ENCOUNTER — Ambulatory Visit: Payer: Medicare PPO | Admitting: Physical Therapy

## 2024-02-05 ENCOUNTER — Encounter: Payer: Self-pay | Admitting: Physical Therapy

## 2024-02-05 DIAGNOSIS — R600 Localized edema: Secondary | ICD-10-CM

## 2024-02-05 DIAGNOSIS — R2689 Other abnormalities of gait and mobility: Secondary | ICD-10-CM | POA: Diagnosis not present

## 2024-02-05 DIAGNOSIS — M6281 Muscle weakness (generalized): Secondary | ICD-10-CM | POA: Diagnosis not present

## 2024-02-05 DIAGNOSIS — M25661 Stiffness of right knee, not elsewhere classified: Secondary | ICD-10-CM

## 2024-02-05 DIAGNOSIS — M25561 Pain in right knee: Secondary | ICD-10-CM | POA: Diagnosis not present

## 2024-02-05 NOTE — Therapy (Signed)
OUTPATIENT PHYSICAL THERAPY  TREATMENT   Patient Name: [facility] MRN: [id number] DOB:03/05/1951, 73 y.o., female Today's Date: 02/05/2024  END OF SESSION:  PT End of Session - 02/05/24 1315     Visit Number 8    Number of Visits 15    Date for PT Re-Evaluation 03/13/24    Authorization Type [employer] MCR    Authorization Time Period -03/13/2024    Authorization - Visit Number 8    Authorization - Number of Visits 12    Progress Note Due on Visit 10    PT Start Time 1303    PT Stop Time 1351    PT Time Calculation (min) 48 min    Activity Tolerance Patient tolerated treatment well    Behavior During Therapy WFL for tasks assessed/performed             Past Medical History:  Diagnosis Date   Allergy    Anxiety    Arthritis    Depression    Difficult intubation    pt states she was told it was hard to intubate her due to "it not being straight." This was only told to her during her hysteroscopy   Dyspnea    pt states this is usually due to weather, and she may have to use her inhaler (seasonal allergies)   Family history of breast cancer    Hyperlipidemia    Osteoarthritis of right knee    Varicose vein of leg    RLE   Past Surgical History:  Procedure Laterality Date   GUM SURGERY     HYSTEROSCOPY     KNEE SURGERY Right    TOTAL KNEE ARTHROPLASTY Right 12/31/2023   Procedure: RIGHT TOTAL KNEE ARTHROPLASTY;  Surgeon: Tarry Kos, MD;  Location: [facility];  Service: Orthopedics;  Laterality: Right;   Patient Active Problem List   Diagnosis Date Noted   Primary osteoarthritis of right knee 12/31/2023   Status post total right knee replacement 12/31/2023   OA (osteoarthritis) of knee 12/31/2023   Pre-operative clearance 11/08/2023   Varicose veins of both legs with edema 09/16/2018   Depression, major, single episode, complete remission (HCC) 09/24/2017   Routine general medical examination at a health care facility 09/24/2017   Bilateral temporomandibular joint  pain 04/06/2016   Disorder of left eustachian tube 04/06/2016   Otalgia of both ears 04/06/2016   Family history of breast cancer    Allergic rhinitis 07/04/2007    PCP: Shirline Frees, NP   REFERRING PROVIDER: Roda Shutters, MD  REFERRING DIAG: S/P total knee arthroplasty, right  - Primary [phone]    THERAPY DIAG:  Acute pain of right knee  Stiffness of right knee, not elsewhere classified  Muscle weakness (generalized)  Other abnormalities of gait and mobility  Localized edema  Rationale for Evaluation and Treatment: Rehabilitation  ONSET DATE: 12/31/2023; s/p R TKA  SUBJECTIVE:   SUBJECTIVE STATEMENT: Denies pain.   PERTINENT HISTORY: Anxiety and depression, Hyperlipidemia, Vitamin D deficiency, Varicose veins of both legs with edema (2019)   PAIN:  NPRS scale:0 today Pain location: anterior medial Rt knee Pain description: tender, swollen, pain is not constant Aggravating factors: overdoing it with activity and CPM Relieving factors: ice, elevation, movment  PRECAUTIONS: Knee  WEIGHT BEARING RESTRICTIONS: No  FALLS:  Has patient fallen in last 6 months? No  LIVING ENVIRONMENT: 4 steps to enter house, with handrail on Right  OCCUPATION: retired  PLOF: Independent, did not use AD   PATIENT GOALS: walk  without the walkder, want to be able to squat  Next MD visit: none scheduled yet, saw Dr. this morning at 10am  OBJECTIVE:  DIAGNOSTIC FINDINGS:  Interval total right knee arthroplasty. No perihardware lucency is seen to indicate hardware failure or loosening. Mild-to-moderate joint effusion. Expected postoperative intra-articular and anterior subcutaneous air. No acute fracture or dislocation.   IMPRESSION: Interval total right knee arthroplasty without evidence of hardware failure.  PATIENT SURVEYS:  01/17/2024 FOTO intake:58% functional    predicted:  69  COGNITION: 01/17/2024 Overall cognitive status: [facility]    EDEMA:  01/17/2024 Moderate edema noted  in Right knee  LOWER EXTREMITY ROM:   AROM/PROM Right 01/17/2024 Right 01/24/24 Right 01/30/24 Right 02/01/2024 Right 02/05/24  Knee flexion 65/80 /95 85/93 Active 98 P:102  Knee extension 10/8   Active 5    (Blank rows = not tested)  LOWER EXTREMITY MMT:  MMT Right 01/17/2024   Hip flexion 4   Knee flexion 4   Knee extension 4    (Blank rows = not tested)   FUNCTIONAL TESTS:  01/17/2024 18 inch chair transfer: needs UE support to stand  GAIT: 01/17/2024 Distance walked: 75 feet Assistive device utilized: Environmental consultant - 2 wheeled Level of assistance: Complete Independence                       TODAY'S TREATMENT 02/05/24 TherEx SciFit bike seat 8 x 8 min; L3  Gastroc stretch 30 sec X 3 bilat  TherAct (to facilitate function mobility including transfers, stairs, etc) Double Leg Press 56# 15 x 2  Single Leg Press 31# 10 x 2  Calf raises off incline board X 15 bilat Sit to stands no UE support from slightly raised table X 10 Forward step ups onto 6" step x 10 reps leading with Rt progressed to no UE support Lateral step ups onto 6" step x 10 reps leading with Rt, no Ue support Arrives and also ambulated around clinic today without AD  Manual Rt knee PROM with overpressure to tolerance seated flexion and supine extension with extension mobs grade 3-4  Vasopneumatic 10 mins medium compression Rt knee in elevation.  DATE 02/01/2024 Recumbent bike Seat 6 & 5 3 minutes each for AAROM Tailgate knee flexion 1 minute Seated knee flexion AAROM 10 x 10 seconds  Quad sets with heel prop 2 sets of 10 for 5 seconds Supine knee flexion with strap 10 x 10 seconds  Functional Activities: Double Leg Press 50# 15 x with full extension and stretch into flexion for sit to stand  Single Leg Press 25# 10 x with full extension and stretch into flexion for sit to stand   Vaso right knee Medium 34* 10 minutes   PATIENT EDUCATION: Education details: HEP, PT plan of care Person educated:  Patient Education method: Explanation, Demonstration, Verbal cues, and Handouts Education comprehension: verbalized understanding and needs further education   HOME EXERCISE PROGRAM: Access Code: [email or web address] URL: [email or web address] Date: 01/17/2024 Prepared by: Ivery Quale  Exercises - Supine Heel Slide with Strap  - 3 x daily - 6 x weekly - 1 sets - 10 reps - 5 hold - Supine Active Straight Leg Raise  - 2 x daily - 6 x weekly - 1-2 sets - 10 reps - Supine Knee Extension Stretch on Towel Roll  - 3 x daily - 6 x weekly - 1 sets - 10 reps - 5 sec hold - Seated Knee Flexion AAROM  - 3 x daily - 6  x weekly - 1 sets - 10 reps - 5 hold - Sit to Stand with Armchair  - 2 x daily - 6 x weekly - 2 sets - 5 reps  ASSESSMENT:  CLINICAL IMPRESSION:  She is making good overall progress but will continue to benefit from skilled PT to improve function.  OBJECTIVE IMPAIRMENTS: decreased activity tolerance, difficulty walking, decreased balance, decreased endurance, decreased mobility, decreased ROM, decreased strength, impaired flexibility, impaired LE use, postural dysfunction, and pain.  ACTIVITY LIMITATIONS: bending, lifting, carry, locomotion, cleaning, community activity, driving,  PERSONAL FACTORS: see above PMH are also affecting patient's functional outcome.  REHAB POTENTIAL: Good  CLINICAL DECISION MAKING: Stable/uncomplicated  EVALUATION COMPLEXITY: Low    GOALS: Short term PT Goals Target date: 02/14/2024   Pt will be I and compliant with HEP. Baseline:  Goal status: Met 02/01/2024 Pt will be able to stand from 18 inch chair without UE support Baseline: Goal status: on going 01/23/2024  Long term PT goals Target date:03/13/2024   Pt will improve Rt knee AROM to [facility] (0-110 deg) to improve functional mobility Baseline: Goal status: On Going 02/01/2024 Pt will improve  hip/knee strength to at least 5-/5 MMT to improve functional strength Baseline: Goal status:  New Pt will improve FOTO to at least % functional to show improved function Baseline: Goal status: New Pt will reduce pain to overall less than 2-3/10 with usual activity Baseline: Goal status: On Going 02/01/2024 Pt will be able to ambulate community distances at least 500 ft WNL gait pattern and independently without complaints Baseline: Goal status: On Going 02/01/2024  PLAN: PT FREQUENCY: 2-3 times per week   PT DURATION: 6-8 weeks  PLANNED INTERVENTIONS (unless contraindicated):  97110-Therapeutic exercises, 97530- Therapeutic activity, 97112- Neuromuscular re-education, 97535- Self Care, 96045- Manual therapy, [id number]- Gait training, [id number]- Aquatic Therapy, 97016- Vasopneumatic device, 97035- Ultrasound, and Balance training  PLAN FOR NEXT SESSION: Continue ROM gains, quadriceps strengthening, balance/gait progressions and edema control.       Referring diagnosis? S/P total knee arthroplasty, right  - Primary [phone]  Treatment diagnosis? (if different than referring diagnosis) m25.561 What was this (referring dx) caused by? [x]  Surgery []  Fall []  Ongoing issue []  Arthritis []  Other: ____________  Laterality: [x]  Rt []  Lt []  Both  Check all possible CPT codes:  *CHOOSE 10 OR LESS*    See Planned Interventions listed in the Plan section of the Evaluation.

## 2024-02-07 ENCOUNTER — Encounter: Payer: Self-pay | Admitting: Physical Therapy

## 2024-02-07 ENCOUNTER — Ambulatory Visit: Payer: Medicare PPO | Admitting: Physical Therapy

## 2024-02-07 DIAGNOSIS — M25561 Pain in right knee: Secondary | ICD-10-CM | POA: Diagnosis not present

## 2024-02-07 DIAGNOSIS — M6281 Muscle weakness (generalized): Secondary | ICD-10-CM | POA: Diagnosis not present

## 2024-02-07 DIAGNOSIS — R600 Localized edema: Secondary | ICD-10-CM | POA: Diagnosis not present

## 2024-02-07 DIAGNOSIS — R2689 Other abnormalities of gait and mobility: Secondary | ICD-10-CM | POA: Diagnosis not present

## 2024-02-07 DIAGNOSIS — M25661 Stiffness of right knee, not elsewhere classified: Secondary | ICD-10-CM | POA: Diagnosis not present

## 2024-02-07 NOTE — Therapy (Signed)
 OUTPATIENT PHYSICAL THERAPY  TREATMENT   Patient Name: Tamara Hughes MRN: [id number] DOB:[birth year], 73 y.o., female Today's Date: 02/07/2024  END OF SESSION:  PT End of Session - 02/07/24 1307     Visit Number 9    Number of Visits 15    Date for PT Re-Evaluation 03/13/24    Authorization Type [employer] M[employer]    Authorization Time Period -03/13/2024    Authorization - Visit Number 9    Authorization - Number of Visits 12    Progress Note Due on Visit 10    PT Start Time 1306    PT Stop Time 1350    PT Time Calculation (min) 44 min    Activity Tolerance Patient tolerated treatment well    Behavior During Therapy WFL for tasks assessed/performed             Past Medical History:  Diagnosis Date   Allergy    Anxiety    Arthritis    Depression    Difficult intubation    pt states she was told it was hard to intubate her due to "it not being straight." This was only told to her during her hysteroscopy   Dyspnea    pt states this is usually due to weather, and she may have to use her inhaler (seasonal allergies)   Family history of breast cancer    Hyperlipidemia    Osteoarthritis of right knee    Varicose vein of leg    RLE   Past Surgical History:  Procedure Laterality Date   GUM SURGERY     HYSTEROSCOPY     KNEE SURGERY Right    TOTAL KNEE ARTHROPLASTY Right 12/31/2023   Procedure: RIGHT TOTAL KNEE ARTHROPLASTY;  Surgeon: Tarry Kos, MD;  Location: [facility] OR;  Service: Orthopedics;  Laterality: Right;   Patient Active Problem List   Diagnosis Date Noted   Primary osteoarthritis of right knee 12/31/2023   Status post total right knee replacement 12/31/2023   OA (osteoarthritis) of knee 12/31/2023   Pre-operative clearance 11/08/2023   Varicose veins of both legs with edema 09/16/2018   Depression, major, single episode, complete remission (HCC) 09/24/2017   Routine general medical examination at a health care facility 09/24/2017   Bilateral temporomandibular joint  pain 04/06/2016   Disorder of left eustachian tube 04/06/2016   Otalgia of both ears 04/06/2016   Family history of breast cancer    Allergic rhinitis 07/04/2007    PCP: Shirline Frees, NP   REFERRING PROVIDER: Roda Shutters, MD  REFERRING DIAG: S/P total knee arthroplasty, right  - Primary [phone]    THERAPY DIAG:  Acute pain of right knee  Stiffness of right knee, not elsewhere classified  Muscle weakness (generalized)  Other abnormalities of gait and mobility  Localized edema  Rationale for Evaluation and Treatment: Rehabilitation  ONSET DATE: 12/31/2023; s/p R TKA  SUBJECTIVE:   SUBJECTIVE STATEMENT: Denies pain but does have stiffness in her knee  PERTINENT HISTORY: Anxiety and depression, Hyperlipidemia, Vitamin D deficiency, Varicose veins of both legs with edema (2019)   PAIN:  NPRS scale:0 today Pain location: anterior medial Rt knee Pain description: tender, swollen, pain is not constant Aggravating factors: overdoing it with activity and CPM Relieving factors: ice, elevation, movment  PRECAUTIONS: Knee  WEIGHT BEARING RESTRICTIONS: No  FALLS:  Has patient fallen in last 6 months? No  LIVING ENVIRONMENT: 4 steps to enter house, with handrail on Right  OCCUPATION: retired  PLOF: Independent, did not use  AD   PATIENT GOALS: walk without the walkder, want to be able to squat  Next MD visit: none scheduled yet, saw Dr. this morning at 10am  OBJECTIVE:  DIAGNOSTIC FINDINGS:  Interval total right knee arthroplasty. No perihardware lucency is seen to indicate hardware failure or loosening. Mild-to-moderate joint effusion. Expected postoperative intra-articular and anterior subcutaneous air. No acute fracture or dislocation.   IMPRESSION: Interval total right knee arthroplasty without evidence of hardware failure.  PATIENT SURVEYS:  01/17/2024 FOTO intake:58% functional    predicted:  69  COGNITION: 01/17/2024 Overall cognitive status: [facility]    EDEMA:   01/17/2024 Moderate edema noted in Right knee  LOWER EXTREMITY ROM:   AROM/PROM Right 01/17/2024 Right 01/24/24 Right 01/30/24 Right 02/01/2024 Right 02/05/24  Knee flexion 65/80 /95 85/93 Active 98 P:102  Knee extension 10/8   Active 5    (Blank rows = not tested)  LOWER EXTREMITY MMT:  MMT Right 01/17/2024   Hip flexion 4   Knee flexion 4   Knee extension 4    (Blank rows = not tested)   FUNCTIONAL TESTS:  01/17/2024 18 inch chair transfer: needs UE support to stand  GAIT: 01/17/2024 Distance walked: 75 feet Assistive device utilized: Environmental consultant - 2 wheeled Level of assistance: Complete Independence                       TODAY'S TREATMENT 02/07/24 TherEx Nu step L5 X 8 in UE/LE Gastroc stretch 30 sec X 3 bilat  TherAct (to facilitate function mobility including transfers, stairs, etc) Calf raises off incline board X 15 bilat Double Leg Press 62# 15 x 2  Single Leg Press 37# 15 x 2  Sit to stands no UE support from slightly raised table X 10 Forward step ups onto 6" step x 12 reps leading with Rt progressed to no UE support Lateral step ups onto 6" step x 12 reps leading with Rt, no Ue support Gait around clinic without AD, independently now, no longer requires supervision  Manual Rt knee PROM with overpressure to tolerance seated flexion and supine extension with extension mobs grade 3-4  Vasopneumatic 10 mins medium compression Rt knee in elevation.  02/05/24 TherEx SciFit bike seat 8 x 8 min; L3  Gastroc stretch 30 sec X 3 bilat  TherAct (to facilitate function mobility including transfers, stairs, etc) Double Leg Press 56# 15 x 2  Single Leg Press 31# 10 x 2  Calf raises off incline board X 15 bilat Sit to stands no UE support from slightly raised table X 10 Forward step ups onto 6" step x 10 reps leading with Rt progressed to no UE support Lateral step ups onto 6" step x 10 reps leading with Rt, no Ue support Arrives and also ambulated around clinic today  without AD  Manual Rt knee PROM with overpressure to tolerance seated flexion and supine extension with extension mobs grade 3-4  Vasopneumatic 10 mins medium compression Rt knee in elevation.  DATE 02/01/2024 Recumbent bike Seat 6 & 5 3 minutes each for AAROM Tailgate knee flexion 1 minute Seated knee flexion AAROM 10 x 10 seconds  Quad sets with heel prop 2 sets of 10 for 5 seconds Supine knee flexion with strap 10 x 10 seconds  Functional Activities: Double Leg Press 50# 15 x with full extension and stretch into flexion for sit to stand  Single Leg Press 25# 10 x with full extension and stretch into flexion for sit to stand  Vaso right knee Medium 34* 10 minutes   PATIENT EDUCATION: Education details: HEP, PT plan of care Person educated: Patient Education method: Explanation, Demonstration, Verbal cues, and Handouts Education comprehension: verbalized understanding and needs further education   HOME EXERCISE PROGRAM: Access Code: [email or web address] URL: [email or web address] Date: 01/17/2024 Prepared by: Ivery Quale  Exercises - Supine Heel Slide with Strap  - 3 x daily - 6 x weekly - 1 sets - 10 reps - 5 hold - Supine Active Straight Leg Raise  - 2 x daily - 6 x weekly - 1-2 sets - 10 reps - Supine Knee Extension Stretch on Towel Roll  - 3 x daily - 6 x weekly - 1 sets - 10 reps - 5 sec hold - Seated Knee Flexion AAROM  - 3 x daily - 6 x weekly - 1 sets - 10 reps - 5 hold - Sit to Stand with Armchair  - 2 x daily - 6 x weekly - 2 sets - 5 reps  ASSESSMENT:  CLINICAL IMPRESSION:  Her progress over last week has been really good. Continue with current plan. She will need 10th visit progress note next visit.  OBJECTIVE IMPAIRMENTS: decreased activity tolerance, difficulty walking, decreased balance, decreased endurance, decreased mobility, decreased ROM, decreased strength, impaired flexibility, impaired LE use, postural dysfunction, and pain.  ACTIVITY  LIMITATIONS: bending, lifting, carry, locomotion, cleaning, community activity, driving,  PERSONAL FACTORS: see above PMH are also affecting patient's functional outcome.  REHAB POTENTIAL: Good  CLINICAL DECISION MAKING: Stable/uncomplicated  EVALUATION COMPLEXITY: Low    GOALS: Short term PT Goals Target date: 02/14/2024   Pt will be I and compliant with HEP. Baseline:  Goal status: Met 02/01/2024 Pt will be able to stand from 18 inch chair without UE support Baseline: Goal status: on going 01/23/2024  Long term PT goals Target date:03/13/2024   Pt will improve Rt knee AROM to [facility] (0-110 deg) to improve functional mobility Baseline: Goal status: On Going 02/01/2024 Pt will improve  hip/knee strength to at least 5-/5 MMT to improve functional strength Baseline: Goal status: New Pt will improve FOTO to at least % functional to show improved function Baseline: Goal status: New Pt will reduce pain to overall less than 2-3/10 with usual activity Baseline: Goal status: On Going 02/01/2024 Pt will be able to ambulate community distances at least 500 ft WNL gait pattern and independently without complaints Baseline: Goal status: On Going 02/01/2024  PLAN: PT FREQUENCY: 2-3 times per week   PT DURATION: 6-8 weeks  PLANNED INTERVENTIONS (unless contraindicated):  97110-Therapeutic exercises, 97530- Therapeutic activity, 97112- Neuromuscular re-education, 97535- Self Care, 16109- Manual therapy, [id number]- Gait training, [id number]- Aquatic Therapy, 97016- Vasopneumatic device, [id number]- Ultrasound, and Balance training  PLAN FOR NEXT SESSION: 10th visit progress note     Referring diagnosis? S/P total knee arthroplasty, right  - Primary [phone]  Treatment diagnosis? (if different than referring diagnosis) m25.561 What was this (referring dx) caused by? [x]  Surgery []  Fall []  Ongoing issue []  Arthritis []  Other: ____________  Laterality: [x]  Rt []  Lt []  Both  Check all possible  CPT codes:  *CHOOSE 10 OR LESS*    See Planned Interventions listed in the Plan section of the Evaluation.

## 2024-02-08 ENCOUNTER — Encounter: Payer: Self-pay | Admitting: Rehabilitative and Restorative Service Providers"

## 2024-02-08 ENCOUNTER — Ambulatory Visit: Payer: Medicare PPO | Admitting: Rehabilitative and Restorative Service Providers"

## 2024-02-08 DIAGNOSIS — R2689 Other abnormalities of gait and mobility: Secondary | ICD-10-CM | POA: Diagnosis not present

## 2024-02-08 DIAGNOSIS — R600 Localized edema: Secondary | ICD-10-CM

## 2024-02-08 DIAGNOSIS — M6281 Muscle weakness (generalized): Secondary | ICD-10-CM | POA: Diagnosis not present

## 2024-02-08 DIAGNOSIS — M25561 Pain in right knee: Secondary | ICD-10-CM

## 2024-02-08 DIAGNOSIS — M25661 Stiffness of right knee, not elsewhere classified: Secondary | ICD-10-CM | POA: Diagnosis not present

## 2024-02-08 NOTE — Therapy (Signed)
OUTPATIENT PHYSICAL THERAPY  TREATMENT/PROGRESS NOTE  Progress Note Reporting Period 01/17/2024 to 02/08/2024  See note below for Objective Data and Assessment of Progress/Goals.     Patient Name: Tamara Hughes MRN: [id number] DOB:10/21/1951, 73 y.o., female Today's Date: 02/08/2024  END OF SESSION:  PT End of Session - 02/08/24 1145     Visit Number 10    Number of Visits 12    Date for PT Re-Evaluation 03/13/24    Authorization Type [employer]    Authorization - Visit Number 10    Authorization - Number of Visits 12    Progress Note Due on Visit 12    PT Start Time 1102    PT Stop Time 1155    PT Time Calculation (min) 53 min    Equipment Utilized During Treatment Gait belt    Activity Tolerance Patient tolerated treatment well;No increased pain;Patient limited by fatigue    Behavior During Therapy [facility] for tasks assessed/performed              Past Medical History:  Diagnosis Date   Allergy    Anxiety    Arthritis    Depression    Difficult intubation    pt states she was told it was hard to intubate her due to "it not being straight." This was only told to her during her hysteroscopy   Dyspnea    pt states this is usually due to weather, and she may have to use her inhaler (seasonal allergies)   Family history of breast cancer    Hyperlipidemia    Osteoarthritis of right knee    Varicose vein of leg    RLE   Past Surgical History:  Procedure Laterality Date   GUM SURGERY     HYSTEROSCOPY     KNEE SURGERY Right    TOTAL KNEE ARTHROPLASTY Right 12/31/2023   Procedure: RIGHT TOTAL KNEE ARTHROPLASTY;  Surgeon: Tarry Kos, MD;  Location: [facility] OR;  Service: Orthopedics;  Laterality: Right;   Patient Active Problem List   Diagnosis Date Noted   Primary osteoarthritis of right knee 12/31/2023   Status post total right knee replacement 12/31/2023   OA (osteoarthritis) of knee 12/31/2023   Pre-operative clearance 11/08/2023   Varicose veins of both legs  with edema 09/16/2018   Depression, major, single episode, complete remission (HCC) 09/24/2017   Routine general medical examination at a health care facility 09/24/2017   Bilateral temporomandibular joint pain 04/06/2016   Disorder of left eustachian tube 04/06/2016   Otalgia of both ears 04/06/2016   Family history of breast cancer    Allergic rhinitis 07/04/2007    PCP: Shirline Frees, NP   REFERRING PROVIDER: Roda Shutters, MD  REFERRING DIAG: S/P total knee arthroplasty, right  - Primary [phone]   THERAPY DIAG:  Acute pain of right knee  Stiffness of right knee, not elsewhere classified  Muscle weakness (generalized)  Other abnormalities of gait and mobility  Localized edema  Rationale for Evaluation and Treatment: Rehabilitation  ONSET DATE: 12/31/2023; s/p R TKA  SUBJECTIVE:   SUBJECTIVE STATEMENT: Hillarie is typically sleeping through the night without any pain meds, but not always.  Stairs are a remaining concern.  She feels good with her balance.  PERTINENT HISTORY: Anxiety and depression, Hyperlipidemia, Vitamin D deficiency, Varicose veins of both legs with edema (2019)   PAIN:  NPRS scale: 0-5/10 this week Pain location: Rt knee Pain description: Achy Aggravating factors: At night Relieving factors: ice, exercise  PRECAUTIONS: Knee  WEIGHT BEARING RESTRICTIONS: No  FALLS:  Has patient fallen in last 6 months? No  LIVING ENVIRONMENT: 4 steps to enter house, with handrail on Right  OCCUPATION: retired  PLOF: Independent, did not use AD   PATIENT GOALS: walk without the walkder, want to be able to squat  Next MD visit: none scheduled yet, saw Dr. this morning at 10am  OBJECTIVE:  DIAGNOSTIC FINDINGS:  Interval total right knee arthroplasty. No perihardware lucency is seen to indicate hardware failure or loosening. Mild-to-moderate joint effusion. Expected postoperative intra-articular and anterior subcutaneous air. No acute fracture or dislocation.    IMPRESSION: Interval total right knee arthroplasty without evidence of hardware failure.  PATIENT SURVEYS:  02/08/2024 FOTO 75 (Goal met)  01/17/2024 FOTO intake:58% functional    predicted:  69  COGNITION: 01/17/2024 Overall cognitive status: [facility]    EDEMA:  01/17/2024 Moderate edema noted in Right knee  LOWER EXTREMITY ROM:   AROM/PROM Right 01/17/2024 Right 01/24/24 Right 01/30/24 Right 02/01/2024 Right 02/05/24 Right 02/08/2024  Knee flexion 65/80 /95 85/93 Active 98 P:102 Active 99  Knee extension 10/8   Active 5  Active 3   (Blank rows = not tested)  LOWER EXTREMITY MMT:  MMT Right 01/17/2024 Right 02/08/2024  Hip flexion 4   Knee flexion 4   Knee extension 4 In pounds 48.4/26.6   (Blank rows = not tested)   FUNCTIONAL TESTS:  01/17/2024 18 inch chair transfer: needs UE support to stand  GAIT: 01/17/2024 Distance walked: 75 feet Assistive device utilized: Environmental consultant - 2 wheeled Level of assistance: Complete Independence                       TODAY'S TREATMENT 02/08/2024 Recumbent bike Seat 6 & 5 3 minutes each for AAROM Tailgate knee flexion 1 minute Seated knee flexion AAROM 10 x 10 seconds  Quad sets with heel prop 10 for 5 seconds Supine knee flexion without strap 5 x 10 seconds  Functional Activities: Double Leg Press 62# 15 x with full extension and stretch into flexion for sit to stand  Single Leg Press 37# 10 x with full extension and stretch into flexion for sit to stand  Stairs ascend and descend 11 steps up and down 2 x reciprocal gait and 1 handrail like at home Reviewed reassessment findings and discussed progressions to prepare for more independent rehabilitation  Vaso right knee Medium 34* 10 minutes   02/07/24 TherEx Nu step L5 X 8 in UE/LE Gastroc stretch 30 sec X 3 bilat  TherAct (to facilitate function mobility including transfers, stairs, etc) Calf raises off incline board X 15 bilat Double Leg Press 62# 15 x 2  Single Leg Press 37# 15  x 2  Sit to stands no UE support from slightly raised table X 10 Forward step ups onto 6" step x 12 reps leading with Rt progressed to no UE support Lateral step ups onto 6" step x 12 reps leading with Rt, no Ue support Gait around clinic without AD, independently now, no longer requires supervision  Manual Rt knee PROM with overpressure to tolerance seated flexion and supine extension with extension mobs grade 3-4  Vasopneumatic 10 mins medium compression Rt knee in elevation.   02/05/24 TherEx SciFit bike seat 8 x 8 min; L3  Gastroc stretch 30 sec X 3 bilat  TherAct (to facilitate function mobility including transfers, stairs, etc) Double Leg Press 56# 15 x 2  Single Leg Press 31# 10 x 2  Calf  raises off incline board X 15 bilat Sit to stands no UE support from slightly raised table X 10 Forward step ups onto 6" step x 10 reps leading with Rt progressed to no UE support Lateral step ups onto 6" step x 10 reps leading with Rt, no Ue support Arrives and also ambulated around clinic today without AD  Manual Rt knee PROM with overpressure to tolerance seated flexion and supine extension with extension mobs grade 3-4  Vasopneumatic 10 mins medium compression Rt knee in elevation.   PATIENT EDUCATION: Education details: HEP, PT plan of care Person educated: Patient Education method: Explanation, Demonstration, Verbal cues, and Handouts Education comprehension: verbalized understanding and needs further education   HOME EXERCISE PROGRAM: Access Code: [email or web address] URL: [email or web address] Date: 01/17/2024 Prepared by: Ivery Quale  Exercises - Supine Heel Slide with Strap  - 3 x daily - 6 x weekly - 1 sets - 10 reps - 5 hold - Supine Active Straight Leg Raise  - 2 x daily - 6 x weekly - 1-2 sets - 10 reps - Supine Knee Extension Stretch on Towel Roll  - 3 x daily - 6 x weekly - 1 sets - 10 reps - 5 sec hold - Seated Knee Flexion AAROM  - 3 x daily - 6 x weekly -  1 sets - 10 reps - 5 hold - Sit to Stand with Armchair  - 2 x daily - 6 x weekly - 2 sets - 5 reps  ASSESSMENT:  CLINICAL IMPRESSION:  AROM is 3 - 0 - 99 degrees.  FOTO goal is met, although better flexion AROM and quadriceps strength will improve function (particularly confidence with stairs).  Chrystie will benefit from the full recommended course of physical therapy and is on track to meet long-term goals by 03/13/2024.  OBJECTIVE IMPAIRMENTS: decreased activity tolerance, difficulty walking, decreased balance, decreased endurance, decreased mobility, decreased ROM, decreased strength, impaired flexibility, impaired LE use, postural dysfunction, and pain.  ACTIVITY LIMITATIONS: bending, lifting, carry, locomotion, cleaning, community activity, driving,  PERSONAL FACTORS: see above PMH are also affecting patient's functional outcome.  REHAB POTENTIAL: Good  CLINICAL DECISION MAKING: Stable/uncomplicated  EVALUATION COMPLEXITY: Low    GOALS: Short term PT Goals Target date: 02/14/2024   Pt will be I and compliant with HEP. Baseline:  Goal status: Met 02/01/2024 Pt will be able to stand from 18 inch chair without UE support Baseline: Goal status: Met 02/08/2024  Long term PT goals Target date: 03/13/2024   Pt will improve Rt knee AROM to [facility] (0-110 deg) to improve functional mobility Baseline: Goal status: On Going 02/08/2024 Pt will improve  hip/knee strength to at least 5-/5 MMT to improve functional strength Baseline: Goal status: On Going 02/08/2024 Pt will improve FOTO to at least % functional to show improved function Baseline: Goal status: MET 02/08/2024 Pt will reduce pain to overall less than 2-3/10 with usual activity Baseline: Goal status: On Going 02/08/2024 Pt will be able to ambulate community distances at least 500 ft WNL gait pattern and independently without complaints Baseline: Goal status: On Going 02/08/2024  PLAN: PT FREQUENCY: 1-3 times per week   PT  DURATION: 4-5 weeks  PLANNED INTERVENTIONS (unless contraindicated):  97110-Therapeutic exercises, 97530- Therapeutic activity, 97112- Neuromuscular re-education, 97535- Self Care, 16109- Manual therapy, [id number]- Gait training, [id number]- Aquatic Therapy, 97016- Vasopneumatic device, [id number]- Ultrasound, and Balance training  PLAN FOR NEXT SESSION: Knee flexion AROM, quadriceps strength, stairs and remaining extension AROM.  Balance and gait progressions also appropriate.  Cherlyn Cushing PT, MPT   Referring diagnosis? S/P total knee arthroplasty, right  - Primary [phone]  Treatment diagnosis? (if different than referring diagnosis) m25.561 What was this (referring dx) caused by? [x]  Surgery []  Fall []  Ongoing issue []  Arthritis []  Other: ____________  Laterality: [x]  Rt []  Lt []  Both  Check all possible CPT codes:  *CHOOSE 10 OR LESS*    See Planned Interventions listed in the Plan section of the Evaluation.

## 2024-02-12 ENCOUNTER — Ambulatory Visit (INDEPENDENT_AMBULATORY_CARE_PROVIDER_SITE_OTHER): Payer: Self-pay

## 2024-02-12 ENCOUNTER — Ambulatory Visit (INDEPENDENT_AMBULATORY_CARE_PROVIDER_SITE_OTHER): Payer: Medicare PPO | Admitting: Physical Therapy

## 2024-02-12 ENCOUNTER — Ambulatory Visit (INDEPENDENT_AMBULATORY_CARE_PROVIDER_SITE_OTHER): Payer: Medicare PPO | Admitting: Physician Assistant

## 2024-02-12 DIAGNOSIS — M25661 Stiffness of right knee, not elsewhere classified: Secondary | ICD-10-CM

## 2024-02-12 DIAGNOSIS — R600 Localized edema: Secondary | ICD-10-CM

## 2024-02-12 DIAGNOSIS — M6281 Muscle weakness (generalized): Secondary | ICD-10-CM | POA: Diagnosis not present

## 2024-02-12 DIAGNOSIS — Z96651 Presence of right artificial knee joint: Secondary | ICD-10-CM

## 2024-02-12 DIAGNOSIS — R2689 Other abnormalities of gait and mobility: Secondary | ICD-10-CM | POA: Diagnosis not present

## 2024-02-12 DIAGNOSIS — M25561 Pain in right knee: Secondary | ICD-10-CM | POA: Diagnosis not present

## 2024-02-12 MED ORDER — CLINDAMYCIN HCL 300 MG PO CAPS: ORAL_CAPSULE | ORAL | 1 refills | Status: DC

## 2024-02-12 NOTE — Progress Notes (Signed)
 Post-Op Visit Note   Patient: Tamara Hughes           Date of Birth: 10-16-1951           MRN: [id number] Visit Date: 02/12/2024 PCP: Shirline Frees, NP   Assessment & Plan:  Chief Complaint:  Chief Complaint  Patient presents with   Right Knee - Pain   Visit Diagnoses:  1. Status post total right knee replacement     Plan: Patient is a pleasant 73 year old female who comes in today for 6 weeks status post right total knee replacement.  She has been doing well.  She is taking Robaxin and a very occasional oxycodone for pain.  She has been compliant taking aspirin for DVT prophylaxis.  She has been in physical therapy making good progress.  Examination of her right knee reveals a fully healed surgical scar without complication.  Range of motion 0 to 95 degrees.  Calves are soft nontender.  She is stable to valgus and varus stress.  She is neurovascularly intact distally.  At this point, she may discontinue taking the aspirin from our standpoint.  She will continue with physical therapy.  Dental prophylaxis reinforced.  She is allergic to penicillin and doxycycline so I will send in clindamycin as she has an upcoming dentist appointment.  She will follow-up in 6 weeks for recheck.  Call with concerns or questions.  Follow-Up Instructions: Return in about 6 weeks (around 03/25/2024).   Orders:  Orders Placed This Encounter  Procedures   XR Knee 1-2 Views Right   Meds ordered this encounter  Medications   clindamycin (CLEOCIN) 300 MG capsule    Sig: Take two pills one hour prior to dental work    Dispense:  4 capsule    Refill:  1    Imaging: XR Knee 1-2 Views Right Result Date: 02/12/2024 Well-seated prosthesis without complication   PMFS History: Patient Active Problem List   Diagnosis Date Noted   Primary osteoarthritis of right knee 12/31/2023   Status post total right knee replacement 12/31/2023   OA (osteoarthritis) of knee 12/31/2023   Pre-operative clearance  11/08/2023   Varicose veins of both legs with edema 09/16/2018   Depression, major, single episode, complete remission (HCC) 09/24/2017   Routine general medical examination at a health care facility 09/24/2017   Bilateral temporomandibular joint pain 04/06/2016   Disorder of left eustachian tube 04/06/2016   Otalgia of both ears 04/06/2016   Family history of breast cancer    Allergic rhinitis 07/04/2007   Past Medical History:  Diagnosis Date   Allergy    Anxiety    Arthritis    Depression    Difficult intubation    pt states she was told it was hard to intubate her due to "it not being straight." This was only told to her during her hysteroscopy   Dyspnea    pt states this is usually due to weather, and she may have to use her inhaler (seasonal allergies)   Family history of breast cancer    Hyperlipidemia    Osteoarthritis of right knee    Varicose vein of leg    RLE    Family History  Problem Relation Age of Onset   Breast cancer Mother 27   Bone cancer Father 11   Breast cancer Sister 21   Heart attack Sister    Diabetes Sister    Breast cancer Maternal Aunt        dx >50  Cancer Maternal Aunt        NOS   Breast cancer Other        MGF's sister #1   Hyperlipidemia Other    Hypertension Other    Thyroid disease Other    Stomach cancer Neg Hx    Rectal cancer Neg Hx    Pancreatic cancer Neg Hx    Colon cancer Neg Hx     Past Surgical History:  Procedure Laterality Date   GUM SURGERY     HYSTEROSCOPY     KNEE SURGERY Right    TOTAL KNEE ARTHROPLASTY Right 12/31/2023   Procedure: RIGHT TOTAL KNEE ARTHROPLASTY;  Surgeon: Tarry Kos, MD;  Location: [facility] OR;  Service: Orthopedics;  Laterality: Right;   Social History   Occupational History   Not on file  Tobacco Use   Smoking status: Former    Current packs/day: 0.00    Average packs/day: 1 pack/day for 15.0 years (15.0 ttl pk-yrs)    Types: Cigarettes    Start date: 04/20/1968    Quit date: 04/21/1983     Years since quitting: 40.8   Smokeless tobacco: Never  Vaping Use   Vaping status: Never Used  Substance and Sexual Activity   Alcohol use: Yes    Alcohol/week: 1.0 standard drink of alcohol    Types: 1 Cans of beer per week    Comment: maybe one beer every couple of weeks   Drug use: No   Sexual activity: Not on file

## 2024-02-12 NOTE — Therapy (Signed)
 OUTPATIENT PHYSICAL THERAPY  TREATMENT   Patient Name: Tamara Hughes MRN: [id number] DOB:[birth year], 73 y.o., female Today's Date: 02/12/2024  END OF SESSION:  PT End of Session - 02/12/24 1100     Visit Number 11    Number of Visits 12    Date for PT Re-Evaluation 03/13/24    Authorization Type [employer]    Authorization - Number of Visits 12    Progress Note Due on Visit 12    PT Start Time 1100    PT Stop Time 1140    PT Time Calculation (min) 40 min    Equipment Utilized During Treatment Gait belt    Activity Tolerance Patient tolerated treatment well;No increased pain;Patient limited by fatigue    Behavior During Therapy [facility] for tasks assessed/performed               Past Medical History:  Diagnosis Date   Allergy    Anxiety    Arthritis    Depression    Difficult intubation    pt states she was told it was hard to intubate her due to "it not being straight." This was only told to her during her hysteroscopy   Dyspnea    pt states this is usually due to weather, and she may have to use her inhaler (seasonal allergies)   Family history of breast cancer    Hyperlipidemia    Osteoarthritis of right knee    Varicose vein of leg    RLE   Past Surgical History:  Procedure Laterality Date   GUM SURGERY     HYSTEROSCOPY     KNEE SURGERY Right    TOTAL KNEE ARTHROPLASTY Right 12/31/2023   Procedure: RIGHT TOTAL KNEE ARTHROPLASTY;  Surgeon: Tarry Kos, MD;  Location: [facility] OR;  Service: Orthopedics;  Laterality: Right;   Patient Active Problem List   Diagnosis Date Noted   Primary osteoarthritis of right knee 12/31/2023   Status post total right knee replacement 12/31/2023   OA (osteoarthritis) of knee 12/31/2023   Pre-operative clearance 11/08/2023   Varicose veins of both legs with edema 09/16/2018   Depression, major, single episode, complete remission (HCC) 09/24/2017   Routine general medical examination at a health care facility 09/24/2017    Bilateral temporomandibular joint pain 04/06/2016   Disorder of left eustachian tube 04/06/2016   Otalgia of both ears 04/06/2016   Family history of breast cancer    Allergic rhinitis 07/04/2007    PCP: Shirline Frees, NP   REFERRING PROVIDER: Roda Shutters, MD  REFERRING DIAG: S/P total knee arthroplasty, right  - Primary I96.789   THERAPY DIAG:  No diagnosis found.  Rationale for Evaluation and Treatment: Rehabilitation  ONSET DATE: 12/31/2023; s/p R TKA  SUBJECTIVE:   SUBJECTIVE STATEMENT: Pt states she thinks she has scar tissue.   PERTINENT HISTORY: Anxiety and depression, Hyperlipidemia, Vitamin D deficiency, Varicose veins of both legs with edema (2019)   PAIN:  NPRS scale: 0-5/10 this week Pain location: Rt knee Pain description: Achy Aggravating factors: At night Relieving factors: ice, exercise  PRECAUTIONS: Knee  WEIGHT BEARING RESTRICTIONS: No  FALLS:  Has patient fallen in last 6 months? No  LIVING ENVIRONMENT: 4 steps to enter house, with handrail on Right  OCCUPATION: retired  PLOF: Independent, did not use AD   PATIENT GOALS: walk without the walkder, want to be able to squat  Next MD visit: none scheduled yet, saw Dr. this morning at 10am  OBJECTIVE:  DIAGNOSTIC FINDINGS:  Interval total right knee arthroplasty. No perihardware lucency is seen to indicate hardware failure or loosening. Mild-to-moderate joint effusion. Expected postoperative intra-articular and anterior subcutaneous air. No acute fracture or dislocation.   IMPRESSION: Interval total right knee arthroplasty without evidence of hardware failure.  PATIENT SURVEYS:  02/08/2024 FOTO 75 (Goal met)  01/17/2024 FOTO intake:58% functional    predicted:  69  COGNITION: 01/17/2024 Overall cognitive status: [facility]    EDEMA:  01/17/2024 Moderate edema noted in Right knee  LOWER EXTREMITY ROM:   AROM/PROM Right 01/17/2024 Right 01/24/24 Right 01/30/24 Right 02/01/2024 Right 02/05/24 Right  02/08/2024  Knee flexion 65/80 /95 85/93 Active 98 P:102 Active 99  Knee extension 10/8   Active 5  Active 3   (Blank rows = not tested)  LOWER EXTREMITY MMT:  MMT Right 01/17/2024 Right 02/08/2024  Hip flexion 4   Knee flexion 4   Knee extension 4 In pounds 48.4/26.6   (Blank rows = not tested)   FUNCTIONAL TESTS:  01/17/2024 18 inch chair transfer: needs UE support to stand  GAIT: 01/17/2024 Distance walked: 75 feet Assistive device utilized: Environmental consultant - 2 wheeled Level of assistance: Complete Independence                       TODAY'S TREATMENT 02/12/24 Recumbent bike Seat 6 & 5 3 minutes each for AAROM Scar massage along incision line, STM & TPR quads Supine Hip/quad stretch with strap x1 min Supine hamstring stretch with strap x 1 min SLR using strap with quad set for terminal knee ext x10 Runner's step up 6" step 2x10 Side step up 6" step 2x10 Eccentric step down 6" step 2x10 Vasopneumatic 15 mins medium compression Rt knee in elevation.  02/08/2024 Recumbent bike Seat 6 & 5 3 minutes each for AAROM T[place]gate knee flexion 1 minute Seated knee flexion AAROM 10 x 10 seconds  Quad sets with heel prop 10 for 5 seconds Supine knee flexion without strap 5 x 10 seconds  Functional Activities: Double Leg Press 62# 15 x with full extension and stretch into flexion for sit to stand  Single Leg Press 37# 10 x with full extension and stretch into flexion for sit to stand  Stairs ascend and descend 11 steps up and down 2 x reciprocal gait and 1 handrail like at home Reviewed reassessment findings and discussed progressions to prepare for more independent rehabilitation  Vaso right knee Medium 34* 10 minutes   02/07/24 TherEx Nu step L5 X 8 in UE/LE Gastroc stretch 30 sec X 3 bilat  TherAct (to facilitate function mobility including transfers, stairs, etc) Calf raises off incline board X 15 bilat Double Leg Press 62# 15 x 2  Single Leg Press 37# 15 x 2  Sit to stands no  UE support from slightly raised table X 10 Forward step ups onto 6" step x 12 reps leading with Rt progressed to no UE support Lateral step ups onto 6" step x 12 reps leading with Rt, no Ue support Gait around clinic without AD, independently now, no longer requires supervision  Manual Rt knee PROM with overpressure to tolerance seated flexion and supine extension with extension mobs grade 3-4  Vasopneumatic 10 mins medium compression Rt knee in elevation.   02/05/24 TherEx SciFit bike seat 8 x 8 min; L3  Gastroc stretch 30 sec X 3 bilat  TherAct (to facilitate function mobility including transfers, stairs, etc) Double Leg Press 56# 15 x 2  Single Leg Press 31#  10 x 2  Calf raises off incline board X 15 bilat Sit to stands no UE support from slightly raised table X 10 Forward step ups onto 6" step x 10 reps leading with Rt progressed to no UE support Lateral step ups onto 6" step x 10 reps leading with Rt, no Ue support Arrives and also ambulated around clinic today without AD  Manual Rt knee PROM with overpressure to tolerance seated flexion and supine extension with extension mobs grade 3-4  Vasopneumatic 10 mins medium compression Rt knee in elevation.   PATIENT EDUCATION: Education details: HEP, PT plan of care Person educated: Patient Education method: Explanation, Demonstration, Verbal cues, and Handouts Education comprehension: verbalized understanding and needs further education   HOME EXERCISE PROGRAM: Access Code: [email or web address] URL: [email or web address] Date: 01/17/2024 Prepared by: Ivery Quale  Exercises - Supine Heel Slide with Strap  - 3 x daily - 6 x weekly - 1 sets - 10 reps - 5 hold - Supine Active Straight Leg Raise  - 2 x daily - 6 x weekly - 1-2 sets - 10 reps - Supine Knee Extension Stretch on Towel Roll  - 3 x daily - 6 x weekly - 1 sets - 10 reps - 5 sec hold - Seated Knee Flexion AAROM  - 3 x daily - 6 x weekly - 1 sets - 10 reps - 5  hold - Sit to Stand with Armchair  - 2 x daily - 6 x weekly - 2 sets - 5 reps  ASSESSMENT:  CLINICAL IMPRESSION:  Pt complaining of stiffness around her scar. Educated and performed scar massage on pt and discussed massaging her quads and hamstrings for home as well. Continued to work on improving knee ROM and strength with weight bearing for activities such as stairs and amb.   OBJECTIVE IMPAIRMENTS: decreased activity tolerance, difficulty walking, decreased balance, decreased endurance, decreased mobility, decreased ROM, decreased strength, impaired flexibility, impaired LE use, postural dysfunction, and pain.  ACTIVITY LIMITATIONS: bending, lifting, carry, locomotion, cleaning, community activity, driving,  PERSONAL FACTORS: see above PMH are also affecting patient's functional outcome.  REHAB POTENTIAL: Good  CLINICAL DECISION MAKING: Stable/uncomplicated  EVALUATION COMPLEXITY: Low    GOALS: Short term PT Goals Target date: 02/14/2024   Pt will be I and compliant with HEP. Baseline:  Goal status: Met 02/01/2024 Pt will be able to stand from 18 inch chair without UE support Baseline: Goal status: Met 02/08/2024  Long term PT goals Target date: 03/13/2024   Pt will improve Rt knee AROM to [facility] (0-110 deg) to improve functional mobility Baseline: Goal status: On Going 02/08/2024 Pt will improve  hip/knee strength to at least 5-/5 MMT to improve functional strength Baseline: Goal status: On Going 02/08/2024 Pt will improve FOTO to at least % functional to show improved function Baseline: Goal status: MET 02/08/2024 Pt will reduce pain to overall less than 2-3/10 with usual activity Baseline: Goal status: On Going 02/08/2024 Pt will be able to ambulate community distances at least 500 ft WNL gait pattern and independently without complaints Baseline: Goal status: On Going 02/08/2024  PLAN: PT FREQUENCY: 1-3 times per week   PT DURATION: 4-5 weeks  PLANNED INTERVENTIONS  (unless contraindicated):  97110-Therapeutic exercises, 97530- Therapeutic activity, 97112- Neuromuscular re-education, 97535- Self Care, 16109- Manual therapy, [id number]- Gait training, [id number]- Aquatic Therapy, [id number]- Vasopneumatic device, [id number]- Ultrasound, and Balance training  PLAN FOR NEXT SESSION: Knee flexion AROM, quadriceps strength, stairs and remaining extension AROM.  Balance and gait progressions also  appropriate.  Chike Farrington April Ma L Kevante Lunt, PT 02/12/2024, 11:35 AM    Referring diagnosis? S/P total knee arthroplasty, right  - Primary [phone]  Treatment diagnosis? (if different than referring diagnosis) m25.561 What was this (referring dx) caused by? [x]  Surgery []  Fall []  Ongoing issue []  Arthritis []  Other: ____________  Laterality: [x]  Rt []  Lt []  Both  Check all possible CPT codes:  *CHOOSE 10 OR LESS*    See Planned Interventions listed in the Plan section of the Evaluation.

## 2024-02-14 ENCOUNTER — Encounter: Payer: Self-pay | Admitting: Physical Therapy

## 2024-02-14 ENCOUNTER — Ambulatory Visit: Payer: Medicare PPO | Admitting: Physical Therapy

## 2024-02-14 DIAGNOSIS — R600 Localized edema: Secondary | ICD-10-CM

## 2024-02-14 DIAGNOSIS — M25661 Stiffness of right knee, not elsewhere classified: Secondary | ICD-10-CM | POA: Diagnosis not present

## 2024-02-14 DIAGNOSIS — R2689 Other abnormalities of gait and mobility: Secondary | ICD-10-CM | POA: Diagnosis not present

## 2024-02-14 DIAGNOSIS — M6281 Muscle weakness (generalized): Secondary | ICD-10-CM

## 2024-02-14 DIAGNOSIS — M25561 Pain in right knee: Secondary | ICD-10-CM | POA: Diagnosis not present

## 2024-02-14 NOTE — Therapy (Addendum)
 OUTPATIENT PHYSICAL THERAPY  TREATMENT   Patient Name: Tamara Hughes MRN: [id number] DOB:1951/01/16, 73 y.o., female Today's Date: 02/14/2024  END OF SESSION:  PT End of Session - 02/14/24 1143     Visit Number 12    Number of Visits 15    Date for PT Re-Evaluation 03/13/24    Authorization Type [employer]    Authorization - Number of Visits 12    Progress Note Due on Visit 12    PT Start Time 1103    PT Stop Time 1155    PT Time Calculation (min) 52 min    Equipment Utilized During Treatment Gait belt    Activity Tolerance Patient tolerated treatment well;No increased pain;Patient limited by fatigue    Behavior During Therapy [facility] for tasks assessed/performed                Past Medical History:  Diagnosis Date   Allergy    Anxiety    Arthritis    Depression    Difficult intubation    pt states she was told it was hard to intubate her due to "it not being straight." This was only told to her during her hysteroscopy   Dyspnea    pt states this is usually due to weather, and she may have to use her inhaler (seasonal allergies)   Family history of breast cancer    Hyperlipidemia    Osteoarthritis of right knee    Varicose vein of leg    RLE   Past Surgical History:  Procedure Laterality Date   GUM SURGERY     HYSTEROSCOPY     KNEE SURGERY Right    TOTAL KNEE ARTHROPLASTY Right 12/31/2023   Procedure: RIGHT TOTAL KNEE ARTHROPLASTY;  Surgeon: Tarry Kos, MD;  Location: [facility] OR;  Service: Orthopedics;  Laterality: Right;   Patient Active Problem List   Diagnosis Date Noted   Primary osteoarthritis of right knee 12/31/2023   Status post total right knee replacement 12/31/2023   OA (osteoarthritis) of knee 12/31/2023   Pre-operative clearance 11/08/2023   Varicose veins of both legs with edema 09/16/2018   Depression, major, single episode, complete remission (HCC) 09/24/2017   Routine general medical examination at a health care facility 09/24/2017    Bilateral temporomandibular joint pain 04/06/2016   Disorder of left eustachian tube 04/06/2016   Otalgia of both ears 04/06/2016   Family history of breast cancer    Allergic rhinitis 07/04/2007    PCP: Shirline Frees, NP   REFERRING PROVIDER: Roda Shutters, MD  REFERRING DIAG: S/P total knee arthroplasty, right  - Primary [phone]   THERAPY DIAG:  Acute pain of right knee  Stiffness of right knee, not elsewhere classified  Other abnormalities of gait and mobility  Muscle weakness (generalized)  Localized edema  Rationale for Evaluation and Treatment: Rehabilitation  ONSET DATE: 12/31/2023; s/p R TKA  SUBJECTIVE:   SUBJECTIVE STATEMENT: States that her knee is feeling more stiff today.    PERTINENT HISTORY: Anxiety and depression, Hyperlipidemia, Vitamin D deficiency, Varicose veins of both legs with edema (2019)   PAIN:  NPRS scale: no pain, just stiff Pain location: Rt knee Pain description: Achy Aggravating factors: At night Relieving factors: ice, exercise  PRECAUTIONS: Knee  WEIGHT BEARING RESTRICTIONS: No  FALLS:  Has patient fallen in last 6 months? No  LIVING ENVIRONMENT: 4 steps to enter house, with handrail on Right  OCCUPATION: retired  PLOF: Independent, did not use AD   PATIENT GOALS: walk  without the walkder, want to be able to squat  Next MD visit: none scheduled yet, saw Dr. this morning at 10am  OBJECTIVE:  DIAGNOSTIC FINDINGS:  Interval total right knee arthroplasty. No perihardware lucency is seen to indicate hardware failure or loosening. Mild-to-moderate joint effusion. Expected postoperative intra-articular and anterior subcutaneous air. No acute fracture or dislocation.   IMPRESSION: Interval total right knee arthroplasty without evidence of hardware failure.  PATIENT SURVEYS:  02/08/2024 FOTO 75 (Goal met)  01/17/2024 FOTO intake:58% functional    predicted:  69  COGNITION: 01/17/2024 Overall cognitive status: [facility]    EDEMA:   01/17/2024 Moderate edema noted in Right knee  LOWER EXTREMITY ROM:   AROM/PROM Right 01/17/2024 Right 01/24/24 Right 01/30/24 Right 02/01/2024 Right 02/05/24 Right 02/08/2024 Right 02/14/2024  Knee flexion 65/80 /95 85/93 Active 98 P:102 Active 99 A: 100 P: 104  Knee extension 10/8   Active 5  Active 3    (Blank rows = not tested)  LOWER EXTREMITY MMT:  MMT Right 01/17/2024 Right 02/08/2024  Hip flexion 4   Knee flexion 4   Knee extension 4 In pounds 48.4/26.6   (Blank rows = not tested)   FUNCTIONAL TESTS:  01/17/2024 18 inch chair transfer: needs UE support to stand  GAIT: 01/17/2024 Distance walked: 75 feet Assistive device utilized: Environmental consultant - 2 wheeled Level of assistance: Complete Independence                     TODAY'S TREATMENT 02/14/24 Therex: Scifit bike full revolutions with BLEs only for AAROM Seated hamstring stretch long sit with strap 2x30 Supine quad and hip stretch with strap 2x30 Gastroc step stretch 3x30sec PT updated HEP with HO, demo & verbal cues.  Pt verbalized understanding after completing in clinic.   Self care: patient educated on continued stretching and elevation with icing to reduce swelling at the knee. Patient also educated on scar mobs for prevention of adhesions, direction of mobs, and use of lotion and vitamin e oil. Patient verbalized understanding for all education provided.  Manual: Scar tissue mobilization instrument assisted and non instrument assisted Seated knee flexion with over pressure and IR distraction to patient tolerance.   Vaso: to right knee , med pressure, 34deg BUE elevated   02/12/24 Recumbent bike Seat 6 & 5 3 minutes each for AAROM Scar massage along incision line, STM & TPR quads Supine Hip/quad stretch with strap x1 min Supine hamstring stretch with strap x 1 min SLR using strap with quad set for terminal knee ext x10 Runner's step up 6" step 2x10 Side step up 6" step 2x10 Eccentric step down 6" step  2x10 Vasopneumatic 15 mins medium compression Rt knee in elevation.  02/08/2024 Recumbent bike Seat 6 & 5 3 minutes each for AAROM [place] knee flexion 1 minute Seated knee flexion AAROM 10 x 10 seconds  Quad sets with heel prop 10 for 5 seconds Supine knee flexion without strap 5 x 10 seconds  Functional Activities: Double Leg Press 62# 15 x with full extension and stretch into flexion for sit to stand  Single Leg Press 37# 10 x with full extension and stretch into flexion for sit to stand  Stairs ascend and descend 11 steps up and down 2 x reciprocal gait and 1 handrail like at home Reviewed reassessment findings and discussed progressions to prepare for more independent rehabilitation  Vaso right knee Medium 34* 10 minutes   PATIENT EDUCATION: Education details: HEP, PT plan of care Person  educated: Patient Education method: Explanation, Demonstration, Verbal cues, and Handouts Education comprehension: verbalized understanding and needs further education   HOME EXERCISE PROGRAM: Access Code: [email or web address] URL: [email or web address] Date: 02/14/2024 Prepared by: Vladimir Faster   Exercises - Supine Heel Slide with Strap  - 3 x daily - 6 x weekly - 1 sets - 10 reps - 5 hold - Supine Active Straight Leg Raise  - 2 x daily - 6 x weekly - 1-2 sets - 10 reps - Supine Knee Extension Stretch on Towel Roll  - 3 x daily - 6 x weekly - 1 sets - 10 reps - 5 sec hold - Seated Knee Flexion AAROM  - 3 x daily - 6 x weekly - 1 sets - 10 reps - 5 hold - Sit to Stand with Armchair  - 2 x daily - 6 x weekly - 2 sets - 5 reps - Seated Table Hamstring Stretch  - 1-2 x daily - 7 x weekly - 1 sets - 3 reps - 30 seconds hold - Supine Quadriceps Stretch with Strap on Table  - 1-2 x daily - 7 x weekly - 1 sets - 3 reps - 30 seconds hold - Gastroc Stretch on Step  - 1-2 x daily - 7 x weekly - 1 sets - 3 reps - 30 seconds hold  ASSESSMENT:  CLINICAL IMPRESSION:  Patient continues to make  progress towards goals and mobility. Continues to demonstrate deficits in ROM as swelling and pain limit knee flexion. Patient educated on effective modalities to reduce swelling with patient verbalizing understanding. Patient will benefit from continued skilled physical therapy to address deficits in ROM and strength.    OBJECTIVE IMPAIRMENTS: decreased activity tolerance, difficulty walking, decreased balance, decreased endurance, decreased mobility, decreased ROM, decreased strength, impaired flexibility, impaired LE use, postural dysfunction, and pain.  ACTIVITY LIMITATIONS: bending, lifting, carry, locomotion, cleaning, community activity, driving,  PERSONAL FACTORS: see above PMH are also affecting patient's functional outcome.  REHAB POTENTIAL: Good  CLINICAL DECISION MAKING: Stable/uncomplicated  EVALUATION COMPLEXITY: Low    GOALS: Short term PT Goals Target date: 02/14/2024   Pt will be I and compliant with HEP. Baseline:  Goal status: Met 02/01/2024 Pt will be able to stand from 18 inch chair without UE support Baseline: Goal status: Met 02/08/2024  Long term PT goals Target date: 03/13/2024   Pt will improve Rt knee AROM to [facility] (0-110 deg) to improve functional mobility Baseline: Goal status: On Going 02/08/2024 Pt will improve  hip/knee strength to at least 5-/5 MMT to improve functional strength Baseline: Goal status: On Going 02/08/2024 Pt will improve FOTO to at least % functional to show improved function Baseline: Goal status: MET 02/08/2024 Pt will reduce pain to overall less than 2-3/10 with usual activity Baseline: Goal status: On Going 02/08/2024 Pt will be able to ambulate community distances at least 500 ft WNL gait pattern and independently without complaints Baseline: Goal status: On Going 02/08/2024  PLAN: PT FREQUENCY: 1-3 times per week   PT DURATION: 4-5 weeks  PLANNED INTERVENTIONS (unless contraindicated):  97110-Therapeutic exercises, 97530-  Therapeutic activity, 97112- Neuromuscular re-education, 97535- Self Care, 40102- Manual therapy, [id number]- Gait training, [id number]- Aquatic Therapy, 97016- Vasopneumatic device, [id number]- Ultrasound, and Balance training  PLAN FOR NEXT SESSION: activities to address quad weakness and flexion ROM. Address deviations of gait. Stair negotiation.  Daxter Paule, Kyiah Canepa, Student-PT 02/14/2024, 1:01 PM  This entire session of physical therapy was performed under the direct supervision of PT signing evaluation /treatment. PT  reviewed note and agrees.   Vladimir Faster, PT, DPT 02/14/2024, 1:33 PM     Referring diagnosis? S/P total knee arthroplasty, right  - Primary [phone]  Treatment diagnosis? (if different than referring diagnosis) m25.561 What was this (referring dx) caused by? [x]  Surgery []  Fall []  Ongoing issue []  Arthritis []  Other: ____________  Laterality: [x]  Rt []  Lt []  Both  Check all possible CPT codes:  *CHOOSE 10 OR LESS*    See Planned Interventions listed in the Plan section of the Evaluation.

## 2024-02-19 ENCOUNTER — Ambulatory Visit: Payer: Medicare PPO | Admitting: Physical Therapy

## 2024-02-19 ENCOUNTER — Encounter: Payer: Self-pay | Admitting: Physical Therapy

## 2024-02-19 DIAGNOSIS — R600 Localized edema: Secondary | ICD-10-CM

## 2024-02-19 DIAGNOSIS — M25561 Pain in right knee: Secondary | ICD-10-CM

## 2024-02-19 DIAGNOSIS — M25661 Stiffness of right knee, not elsewhere classified: Secondary | ICD-10-CM

## 2024-02-19 DIAGNOSIS — R2689 Other abnormalities of gait and mobility: Secondary | ICD-10-CM

## 2024-02-19 DIAGNOSIS — M6281 Muscle weakness (generalized): Secondary | ICD-10-CM | POA: Diagnosis not present

## 2024-02-19 NOTE — Therapy (Signed)
 OUTPATIENT PHYSICAL THERAPY  TREATMENT/[employer] Re-authorization   Patient Name: Tamara Hughes MRN: [id number] DOB:June 23, 1951, 73 y.o., female Today's Date: 02/19/2024  END OF SESSION:  PT End of Session - 02/19/24 1111     Visit Number 13    Number of Visits 16    Date for PT Re-Evaluation 03/13/24    Authorization Type [employer]    Authorization - Number of Visits 13    Progress Note Due on Visit 12    PT Start Time 1105    PT Stop Time 1155    PT Time Calculation (min) 50 min    Equipment Utilized During Treatment --    Activity Tolerance Patient tolerated treatment well;No increased pain    Behavior During Therapy WFL for tasks assessed/performed                Past Medical History:  Diagnosis Date   Allergy    Anxiety    Arthritis    Depression    Difficult intubation    pt states she was told it was hard to intubate her due to "it not being straight." This was only told to her during her hysteroscopy   Dyspnea    pt states this is usually due to weather, and she may have to use her inhaler (seasonal allergies)   Family history of breast cancer    Hyperlipidemia    Osteoarthritis of right knee    Varicose vein of leg    RLE   Past Surgical History:  Procedure Laterality Date   GUM SURGERY     HYSTEROSCOPY     KNEE SURGERY Right    TOTAL KNEE ARTHROPLASTY Right 12/31/2023   Procedure: RIGHT TOTAL KNEE ARTHROPLASTY;  Surgeon: Tarry Kos, MD;  Location: [facility] OR;  Service: Orthopedics;  Laterality: Right;   Patient Active Problem List   Diagnosis Date Noted   Primary osteoarthritis of right knee 12/31/2023   Status post total right knee replacement 12/31/2023   OA (osteoarthritis) of knee 12/31/2023   Pre-operative clearance 11/08/2023   Varicose veins of both legs with edema 09/16/2018   Depression, major, single episode, complete remission (HCC) 09/24/2017   Routine general medical examination at a health care facility 09/24/2017   Bilateral  temporomandibular joint pain 04/06/2016   Disorder of left eustachian tube 04/06/2016   Otalgia of both ears 04/06/2016   Family history of breast cancer    Allergic rhinitis 07/04/2007    PCP: Shirline Frees, NP   REFERRING PROVIDER: Roda Shutters, MD  REFERRING DIAG: S/P total knee arthroplasty, right  - Primary [phone]   THERAPY DIAG:  Acute pain of right knee  Stiffness of right knee, not elsewhere classified  Other abnormalities of gait and mobility  Muscle weakness (generalized)  Localized edema  Rationale for Evaluation and Treatment: Rehabilitation  ONSET DATE: 12/31/2023; s/p R TKA  SUBJECTIVE:   SUBJECTIVE STATEMENT: States that her knee is feeling hypersensitive to touch but is not painful   PERTINENT HISTORY: Anxiety and depression, Hyperlipidemia, Vitamin D deficiency, Varicose veins of both legs with edema (2019)   PAIN:  NPRS scale: no pain, just stiff Pain location: Rt knee Pain description: Achy Aggravating factors: At night Relieving factors: ice, exercise  PRECAUTIONS: Knee  WEIGHT BEARING RESTRICTIONS: No  FALLS:  Has patient fallen in last 6 months? No  LIVING ENVIRONMENT: 4 steps to enter house, with handrail on Right  OCCUPATION: retired  PLOF: Independent, did not use AD   PATIENT GOALS: walk  without the walkder, want to be able to squat  Next MD visit: none scheduled yet, saw Dr. this morning at 10am  OBJECTIVE:  DIAGNOSTIC FINDINGS:  Interval total right knee arthroplasty. No perihardware lucency is seen to indicate hardware failure or loosening. Mild-to-moderate joint effusion. Expected postoperative intra-articular and anterior subcutaneous air. No acute fracture or dislocation.   IMPRESSION: Interval total right knee arthroplasty without evidence of hardware failure.  PATIENT SURVEYS:  02/08/2024 FOTO 75 (Goal met)  01/17/2024 FOTO intake:58% functional    predicted:  69  COGNITION: 01/17/2024 Overall cognitive status:  [facility]    EDEMA:  01/17/2024 Moderate edema noted in Right knee  LOWER EXTREMITY ROM:   AROM/PROM Right 01/17/2024 Right 01/24/24 Right 01/30/24 Right 02/01/2024 Right 02/05/24 Right 02/08/2024 Right 02/14/2024  Knee flexion 65/80 /95 85/93 Active 98 P:102 Active 99 A: 100 P: 104  Knee extension 10/8   Active 5  Active 3    (Blank rows = not tested)  LOWER EXTREMITY MMT:  MMT Right 01/17/2024 Right 02/08/2024  Hip flexion 4   Knee flexion 4   Knee extension 4 In pounds 48.4/26.6   (Blank rows = not tested)   FUNCTIONAL TESTS:  01/17/2024 18 inch chair transfer: needs UE support to stand  GAIT: 01/17/2024 Distance walked: 75 feet Assistive device utilized: Environmental consultant - 2 wheeled Level of assistance: Complete Independence                     TODAY'S TREATMENT 02/19/24 Therex: Scifit bike full revolutions with BLEs only for ROM and endurance Seated hamstring stretch 30 sec X 3 Gastroc stretch slantboard 3x30sec Step ups  6 inch step forward up with Rt and down in front with left, X 10 reps Lateral step ups 6 inch X 10 bilat Leg press DL 40# 9W11, then Rt leg only at 37# 2X10 Seated knee flexion PROM with overpressure -Vasopnuematic device X 10 min, medium compression, 34 deg to Rt knee   02/14/24 Therex: Scifit bike full revolutions with BLEs only for AAROM Seated hamstring stretch long sit with strap 2x30 Supine quad and hip stretch with strap 2x30 Gastroc step stretch 3x30sec PT updated HEP with HO, demo & verbal cues.  Pt verbalized understanding after completing in clinic.   Self care: patient educated on continued stretching and elevation with icing to reduce swelling at the knee. Patient also educated on scar mobs for prevention of adhesions, direction of mobs, and use of lotion and vitamin e oil. Patient verbalized understanding for all education provided.  Manual: Scar tissue mobilization instrument assisted and non instrument assisted Seated knee flexion  with over pressure and IR distraction to patient tolerance.   Vaso: to right knee , med pressure, 34deg BUE elevated    PATIENT EDUCATION: Education details: HEP, PT plan of care Person educated: Patient Education method: Explanation, Demonstration, Verbal cues, and Handouts Education comprehension: verbalized understanding and needs further education   HOME EXERCISE PROGRAM: Access Code: [email or web address] URL: [email or web address] Date: 02/14/2024 Prepared by: Vladimir Faster   Exercises - Supine Heel Slide with Strap  - 3 x daily - 6 x weekly - 1 sets - 10 reps - 5 hold - Supine Active Straight Leg Raise  - 2 x daily - 6 x weekly - 1-2 sets - 10 reps - Supine Knee Extension Stretch on Towel Roll  - 3 x daily - 6 x weekly - 1 sets - 10 reps - 5 sec hold - Seated Knee Flexion AAROM  -  3 x daily - 6 x weekly - 1 sets - 10 reps - 5 hold - Sit to Stand with Armchair  - 2 x daily - 6 x weekly - 2 sets - 5 reps - Seated Table Hamstring Stretch  - 1-2 x daily - 7 x weekly - 1 sets - 3 reps - 30 seconds hold - Supine Quadriceps Stretch with Strap on Table  - 1-2 x daily - 7 x weekly - 1 sets - 3 reps - 30 seconds hold - Gastroc Stretch on Step  - 1-2 x daily - 7 x weekly - 1 sets - 3 reps - 30 seconds hold  ASSESSMENT:  CLINICAL IMPRESSION:  [employer] reauthroization today as she has had her 12/12 initial PT authorized visits. She has made good progress but would benefit from 3 more sessions to maximize function and work toward her remaining PT goals.   OBJECTIVE IMPAIRMENTS: decreased activity tolerance, difficulty walking, decreased balance, decreased endurance, decreased mobility, decreased ROM, decreased strength, impaired flexibility, impaired LE use, postural dysfunction, and pain.  ACTIVITY LIMITATIONS: bending, lifting, carry, locomotion, cleaning, community activity, driving,  PERSONAL FACTORS: see above PMH are also affecting patient's functional outcome.  REHAB  POTENTIAL: Good  CLINICAL DECISION MAKING: Stable/uncomplicated  EVALUATION COMPLEXITY: Low    GOALS: Short term PT Goals Target date: 02/14/2024   Pt will be I and compliant with HEP. Baseline:  Goal status: Met 02/01/2024 Pt will be able to stand from 18 inch chair without UE support Baseline: Goal status: Met 02/08/2024  Long term PT goals Target date: 03/13/2024   Pt will improve Rt knee AROM to [facility] (0-110 deg) to improve functional mobility Baseline: Goal status: On Going 02/19/2024 Pt will improve  hip/knee strength to at least 5-/5 MMT to improve functional strength Baseline: Goal status: MET 02/19/24 Pt will improve FOTO to at least % functional to show improved function Baseline: Goal status: MET 02/08/2024 Pt will reduce pain to overall less than 2-3/10 with usual activity Baseline: Goal status: On Going 02/19/2024 Pt will be able to ambulate community distances at least 500 ft WNL gait pattern and independently without complaints Baseline: Goal status: On Going 02/19/2024  PLAN: PT FREQUENCY: 1-3 times per week   PT DURATION: 4-5 weeks  PLANNED INTERVENTIONS (unless contraindicated):  97110-Therapeutic exercises, 97530- Therapeutic activity, 97112- Neuromuscular re-education, 97535- Self Care, 16109- Manual therapy, [id number]- Gait training, [id number]- Aquatic Therapy, 97016- Vasopneumatic device, [id number]- Ultrasound, and Balance training  PLAN FOR NEXT SESSION: activities to address quad weakness and flexion ROM. Address deviations of gait. Stair negotiation.   April Manson, PT, DPT 02/19/2024, 11:51 AM     Referring diagnosis? S/P total knee arthroplasty, right  - Primary [phone]  Treatment diagnosis? (if different than referring diagnosis) m25.561 What was this (referring dx) caused by? [x]  Surgery []  Fall []  Ongoing issue []  Arthritis []  Other: ____________  Laterality: [x]  Rt []  Lt []  Both  Check all possible CPT codes:  *CHOOSE 10 OR LESS*    See  Planned Interventions listed in the Plan section of the Evaluation.

## 2024-02-21 ENCOUNTER — Encounter: Payer: Self-pay | Admitting: Physical Therapy

## 2024-02-21 ENCOUNTER — Ambulatory Visit: Payer: Medicare PPO | Admitting: Physical Therapy

## 2024-02-21 DIAGNOSIS — R600 Localized edema: Secondary | ICD-10-CM

## 2024-02-21 DIAGNOSIS — M6281 Muscle weakness (generalized): Secondary | ICD-10-CM | POA: Diagnosis not present

## 2024-02-21 DIAGNOSIS — R2689 Other abnormalities of gait and mobility: Secondary | ICD-10-CM

## 2024-02-21 DIAGNOSIS — M25561 Pain in right knee: Secondary | ICD-10-CM

## 2024-02-21 DIAGNOSIS — M25661 Stiffness of right knee, not elsewhere classified: Secondary | ICD-10-CM

## 2024-02-21 NOTE — Therapy (Signed)
 OUTPATIENT PHYSICAL THERAPY  TREATMENT/[employer] Re-authorization   Patient Name: Tamara Hughes MRN: [id number] DOB:1951-03-20, 73 y.o., female Today's Date: 02/21/2024  END OF SESSION:  PT End of Session - 02/21/24 1147     Visit Number 14    Number of Visits 16    Date for PT Re-Evaluation 03/13/24    Authorization Type [employer]    Authorization Time Period -03/13/2024    Authorization - Visit Number 2    Authorization - Number of Visits 10    Progress Note Due on Visit 12    PT Start Time 1110    PT Stop Time 1153    PT Time Calculation (min) 43 min    Activity Tolerance Patient tolerated treatment well;No increased pain    Behavior During Therapy WFL for tasks assessed/performed                 Past Medical History:  Diagnosis Date   Allergy    Anxiety    Arthritis    Depression    Difficult intubation    pt states she was told it was hard to intubate her due to "it not being straight." This was only told to her during her hysteroscopy   Dyspnea    pt states this is usually due to weather, and she may have to use her inhaler (seasonal allergies)   Family history of breast cancer    Hyperlipidemia    Osteoarthritis of right knee    Varicose vein of leg    RLE   Past Surgical History:  Procedure Laterality Date   GUM SURGERY     HYSTEROSCOPY     KNEE SURGERY Right    TOTAL KNEE ARTHROPLASTY Right 12/31/2023   Procedure: RIGHT TOTAL KNEE ARTHROPLASTY;  Surgeon: Tarry Kos, MD;  Location: [facility] OR;  Service: Orthopedics;  Laterality: Right;   Patient Active Problem List   Diagnosis Date Noted   Primary osteoarthritis of right knee 12/31/2023   Status post total right knee replacement 12/31/2023   OA (osteoarthritis) of knee 12/31/2023   Pre-operative clearance 11/08/2023   Varicose veins of both legs with edema 09/16/2018   Depression, major, single episode, complete remission (HCC) 09/24/2017   Routine general medical examination at a health care  facility 09/24/2017   Bilateral temporomandibular joint pain 04/06/2016   Disorder of left eustachian tube 04/06/2016   Otalgia of both ears 04/06/2016   Family history of breast cancer    Allergic rhinitis 07/04/2007    PCP: Shirline Frees, NP   REFERRING PROVIDER: Roda Shutters, MD  REFERRING DIAG: S/P total knee arthroplasty, right  - Primary [phone]   THERAPY DIAG:  Acute pain of right knee  Stiffness of right knee, not elsewhere classified  Other abnormalities of gait and mobility  Muscle weakness (generalized)  Localized edema  Rationale for Evaluation and Treatment: Rehabilitation  ONSET DATE: 12/31/2023; s/p R TKA  SUBJECTIVE:   SUBJECTIVE STATEMENT: States that her knee has some swelling in the outside of her knee. Some improvement with the hypersensitivity  PERTINENT HISTORY: Anxiety and depression, Hyperlipidemia, Vitamin D deficiency, Varicose veins of both legs with edema (2019)   PAIN:  NPRS scale: 2/10 Pain location: Rt knee Pain description: Achy Aggravating factors: At night Relieving factors: ice, exercise  PRECAUTIONS: Knee  WEIGHT BEARING RESTRICTIONS: No  FALLS:  Has patient fallen in last 6 months? No  LIVING ENVIRONMENT: 4 steps to enter house, with handrail on Right  OCCUPATION: retired  PLOF: Independent,  did not use AD   PATIENT GOALS: walk without the walkder, want to be able to squat  Next MD visit: none scheduled yet, saw Dr. this morning at 10am  OBJECTIVE:  DIAGNOSTIC FINDINGS:  Interval total right knee arthroplasty. No perihardware lucency is seen to indicate hardware failure or loosening. Mild-to-moderate joint effusion. Expected postoperative intra-articular and anterior subcutaneous air. No acute fracture or dislocation.   IMPRESSION: Interval total right knee arthroplasty without evidence of hardware failure.  PATIENT SURVEYS:  02/08/2024 FOTO 75 (Goal met)  01/17/2024 FOTO intake:58% functional    predicted:   69  COGNITION: 01/17/2024 Overall cognitive status: [facility]    EDEMA:  01/17/2024 Moderate edema noted in Right knee  LOWER EXTREMITY ROM:   AROM/PROM Right 01/17/2024 Right 01/24/24 Right 01/30/24 Right 02/01/2024 Right 02/05/24 Right 02/08/2024 Right 02/14/2024  Knee flexion 65/80 /95 85/93 Active 98 P:102 Active 99 A: 100 P: 104  Knee extension 10/8   Active 5  Active 3    (Blank rows = not tested)  LOWER EXTREMITY MMT:  MMT Right 01/17/2024 Right 02/08/2024  Hip flexion 4   Knee flexion 4   Knee extension 4 In pounds 48.4/26.6   (Blank rows = not tested)   FUNCTIONAL TESTS:  01/17/2024 18 inch chair transfer: needs UE support to stand  GAIT: 01/17/2024 Distance walked: 75 feet Assistive device utilized: Environmental consultant - 2 wheeled Level of assistance: Complete Independence                     TODAY'S TREATMENT 02/21/24 Recumbent bike seat # 5, 10 min full revolutions with BLEs only for ROM and endurance Stairs in hall, one flight, used one handrail to descend, no handrails to ascend Seated hamstring stretch 30 sec X 3 Seated LAQ 3# 2X15 Sit to stand X 10 Gastroc stretch slantboard 3x30sec Leg press DL 62# 1H08, then Rt leg only at 37# 2X12 Seated knee flexion PROM with overpressure -Vasopnuematic device X 10 min, medium compression, 34 deg to Rt knee  02/19/24 Therex: Scifit bike full revolutions with BLEs only for ROM and endurance Seated hamstring stretch 30 sec X 3 Gastroc stretch slantboard 3x30sec Step ups  6 inch step forward up with Rt and down in front with left, X 10 reps Lateral step ups 6 inch X 10 bilat Leg press DL 65# 7Q46, then Rt leg only at 37# 2X10 Seated knee flexion PROM with overpressure -Vasopnuematic device X 10 min, medium compression, 34 deg to Rt knee   PATIENT EDUCATION: Education details: HEP, PT plan of care Person educated: Patient Education method: Explanation, Demonstration, Verbal cues, and Handouts Education comprehension:  verbalized understanding and needs further education   HOME EXERCISE PROGRAM: Access Code: [email or web address] URL: [email or web address] Date: 02/14/2024 Prepared by: Vladimir Faster   Exercises - Supine Heel Slide with Strap  - 3 x daily - 6 x weekly - 1 sets - 10 reps - 5 hold - Supine Active Straight Leg Raise  - 2 x daily - 6 x weekly - 1-2 sets - 10 reps - Supine Knee Extension Stretch on Towel Roll  - 3 x daily - 6 x weekly - 1 sets - 10 reps - 5 sec hold - Seated Knee Flexion AAROM  - 3 x daily - 6 x weekly - 1 sets - 10 reps - 5 hold - Sit to Stand with Armchair  - 2 x daily - 6 x weekly - 2 sets - 5 reps - Seated Table Hamstring  Stretch  - 1-2 x daily - 7 x weekly - 1 sets - 3 reps - 30 seconds hold - Supine Quadriceps Stretch with Strap on Table  - 1-2 x daily - 7 x weekly - 1 sets - 3 reps - 30 seconds hold - Gastroc Stretch on Step  - 1-2 x daily - 7 x weekly - 1 sets - 3 reps - 30 seconds hold  ASSESSMENT:  CLINICAL IMPRESSION:  She is making good progress and is on track to graduate from PT next week.   OBJECTIVE IMPAIRMENTS: decreased activity tolerance, difficulty walking, decreased balance, decreased endurance, decreased mobility, decreased ROM, decreased strength, impaired flexibility, impaired LE use, postural dysfunction, and pain.  ACTIVITY LIMITATIONS: bending, lifting, carry, locomotion, cleaning, community activity, driving,  PERSONAL FACTORS: see above PMH are also affecting patient's functional outcome.  REHAB POTENTIAL: Good  CLINICAL DECISION MAKING: Stable/uncomplicated  EVALUATION COMPLEXITY: Low    GOALS: Short term PT Goals Target date: 02/14/2024   Pt will be I and compliant with HEP. Baseline:  Goal status: Met 02/01/2024 Pt will be able to stand from 18 inch chair without UE support Baseline: Goal status: Met 02/08/2024  Long term PT goals Target date: 03/13/2024   Pt will improve Rt knee AROM to [facility] (0-110 deg) to improve functional  mobility Baseline: Goal status: On Going 02/19/2024 Pt will improve  hip/knee strength to at least 5-/5 MMT to improve functional strength Baseline: Goal status: MET 02/19/24 Pt will improve FOTO to at least % functional to show improved function Baseline: Goal status: MET 02/08/2024 Pt will reduce pain to overall less than 2-3/10 with usual activity Baseline: Goal status: On Going 02/19/2024 Pt will be able to ambulate community distances at least 500 ft WNL gait pattern and independently without complaints Baseline: Goal status: On Going 02/19/2024  PLAN: PT FREQUENCY: 1-3 times per week   PT DURATION: 4-5 weeks  PLANNED INTERVENTIONS (unless contraindicated):  97110-Therapeutic exercises, 97530- Therapeutic activity, 97112- Neuromuscular re-education, 97535- Self Care, 40981- Manual therapy, [id number]- Gait training, [id number]- Aquatic Therapy, 97016- Vasopneumatic device, 97035- Ultrasound, and Balance training  PLAN FOR NEXT SESSION: Try treadmill, knee ext machine, HS curl machine to prepare for independent gym program after next week.   April Manson, PT, DPT 02/21/2024, 11:48 AM     Referring diagnosis? S/P total knee arthroplasty, right  - Primary [phone]  Treatment diagnosis? (if different than referring diagnosis) m25.561 What was this (referring dx) caused by? [x]  Surgery []  Fall []  Ongoing issue []  Arthritis []  Other: ____________  Laterality: [x]  Rt []  Lt []  Both  Check all possible CPT codes:  *CHOOSE 10 OR LESS*    See Planned Interventions listed in the Plan section of the Evaluation.

## 2024-02-26 ENCOUNTER — Encounter: Payer: Self-pay | Admitting: Physical Therapy

## 2024-02-26 ENCOUNTER — Ambulatory Visit: Payer: Medicare PPO | Admitting: Physical Therapy

## 2024-02-26 DIAGNOSIS — R600 Localized edema: Secondary | ICD-10-CM | POA: Diagnosis not present

## 2024-02-26 DIAGNOSIS — M6281 Muscle weakness (generalized): Secondary | ICD-10-CM

## 2024-02-26 DIAGNOSIS — M25661 Stiffness of right knee, not elsewhere classified: Secondary | ICD-10-CM

## 2024-02-26 DIAGNOSIS — M25561 Pain in right knee: Secondary | ICD-10-CM

## 2024-02-26 DIAGNOSIS — R2689 Other abnormalities of gait and mobility: Secondary | ICD-10-CM | POA: Diagnosis not present

## 2024-02-26 NOTE — Therapy (Signed)
 OUTPATIENT PHYSICAL THERAPY  TREATMENT   Patient Name: Tamara Hughes MRN: [id number] DOB:1951/04/27, 73 y.o., female Today's Date: 02/26/2024  END OF SESSION:  PT End of Session - 02/26/24 1142     Visit Number 15    Number of Visits 16    Date for PT Re-Evaluation 03/13/24    Authorization Type [employer]    Authorization Time Period -03/13/2024    Authorization - Visit Number 3    Authorization - Number of Visits 10    Progress Note Due on Visit 12    PT Start Time 1100    PT Stop Time 1139    PT Time Calculation (min) 39 min    Activity Tolerance Patient tolerated treatment well;No increased pain    Behavior During Therapy WFL for tasks assessed/performed                  Past Medical History:  Diagnosis Date   Allergy    Anxiety    Arthritis    Depression    Difficult intubation    pt states she was told it was hard to intubate her due to "it not being straight." This was only told to her during her hysteroscopy   Dyspnea    pt states this is usually due to weather, and she may have to use her inhaler (seasonal allergies)   Family history of breast cancer    Hyperlipidemia    Osteoarthritis of right knee    Varicose vein of leg    RLE   Past Surgical History:  Procedure Laterality Date   GUM SURGERY     HYSTEROSCOPY     KNEE SURGERY Right    TOTAL KNEE ARTHROPLASTY Right 12/31/2023   Procedure: RIGHT TOTAL KNEE ARTHROPLASTY;  Surgeon: Tarry Kos, MD;  Location: [facility] OR;  Service: Orthopedics;  Laterality: Right;   Patient Active Problem List   Diagnosis Date Noted   Primary osteoarthritis of right knee 12/31/2023   Status post total right knee replacement 12/31/2023   OA (osteoarthritis) of knee 12/31/2023   Pre-operative clearance 11/08/2023   Varicose veins of both legs with edema 09/16/2018   Depression, major, single episode, complete remission (HCC) 09/24/2017   Routine general medical examination at a health care facility 09/24/2017    Bilateral temporomandibular joint pain 04/06/2016   Disorder of left eustachian tube 04/06/2016   Otalgia of both ears 04/06/2016   Family history of breast cancer    Allergic rhinitis 07/04/2007    PCP: Shirline Frees, NP   REFERRING PROVIDER: Roda Shutters, MD  REFERRING DIAG: S/P total knee arthroplasty, right  - Primary [phone]   THERAPY DIAG:  Acute pain of right knee  Stiffness of right knee, not elsewhere classified  Other abnormalities of gait and mobility  Muscle weakness (generalized)  Localized edema  Rationale for Evaluation and Treatment: Rehabilitation  ONSET DATE: 12/31/2023; s/p R TKA  SUBJECTIVE:   SUBJECTIVE STATEMENT: States that her knee has some swelling in the outside of her knee. Some improvement with the hypersensitivity  PERTINENT HISTORY: Anxiety and depression, Hyperlipidemia, Vitamin D deficiency, Varicose veins of both legs with edema (2019)   PAIN:  NPRS scale: 2/10 Pain location: Rt knee Pain description: Achy Aggravating factors: At night Relieving factors: ice, exercise  PRECAUTIONS: Knee  WEIGHT BEARING RESTRICTIONS: No  FALLS:  Has patient fallen in last 6 months? No  LIVING ENVIRONMENT: 4 steps to enter house, with handrail on Right  OCCUPATION: retired  PLOF: Independent,  did not use AD   PATIENT GOALS: walk without the walkder, want to be able to squat  Next MD visit: none scheduled yet, saw Dr. this morning at 10am  OBJECTIVE:  DIAGNOSTIC FINDINGS:  Interval total right knee arthroplasty. No perihardware lucency is seen to indicate hardware failure or loosening. Mild-to-moderate joint effusion. Expected postoperative intra-articular and anterior subcutaneous air. No acute fracture or dislocation.   IMPRESSION: Interval total right knee arthroplasty without evidence of hardware failure.  PATIENT SURVEYS:  02/08/2024 FOTO 75 (Goal met)  01/17/2024 FOTO intake:58% functional    predicted:   69  COGNITION: 01/17/2024 Overall cognitive status: [facility]    EDEMA:  01/17/2024 Moderate edema noted in Right knee  LOWER EXTREMITY ROM:   AROM/PROM Right 01/17/2024 Right 01/24/24 Right 01/30/24 Right 02/01/2024 Right 02/05/24 Right 02/08/2024 Right 02/14/2024  Knee flexion 65/80 /95 85/93 Active 98 P:102 Active 99 A: 100 P: 104  Knee extension 10/8   Active 5  Active 3    (Blank rows = not tested)  LOWER EXTREMITY MMT:  MMT Right 01/17/2024 Right 02/08/2024  Hip flexion 4   Knee flexion 4   Knee extension 4 In pounds 48.4/26.6   (Blank rows = not tested)   FUNCTIONAL TESTS:  01/17/2024 18 inch chair transfer: needs UE support to stand  GAIT: 01/17/2024 Distance walked: 75 feet Assistive device utilized: Environmental consultant - 2 wheeled Level of assistance: Complete Independence                     TODAY'S TREATMENT 02/26/24 Recumbent bike seat # 5, 10 min full revolutions with BLEs only for ROM and endurance Treadmill 1.5 mph X 5 min Leg press 75# 2X15, then 37# Right only 2X15 Leg extension machine 15# DL 1O10, then Right leg only 5# 2X10 Leg curl machine DL 96# 0A54 Gym equipment education and review Practiced bathtub transfer with demo and cues for technique.    PATIENT EDUCATION: Education details: HEP, PT plan of care Person educated: Patient Education method: Explanation, Demonstration, Verbal cues, and Handouts Education comprehension: verbalized understanding and needs further education   HOME EXERCISE PROGRAM: Access Code: [email or web address] URL: [email or web address] Date: 02/14/2024 Prepared by: Vladimir Faster   Exercises - Supine Heel Slide with Strap  - 3 x daily - 6 x weekly - 1 sets - 10 reps - 5 hold - Supine Active Straight Leg Raise  - 2 x daily - 6 x weekly - 1-2 sets - 10 reps - Supine Knee Extension Stretch on Towel Roll  - 3 x daily - 6 x weekly - 1 sets - 10 reps - 5 sec hold - Seated Knee Flexion AAROM  - 3 x daily - 6 x weekly - 1 sets - 10 reps -  5 hold - Sit to Stand with Armchair  - 2 x daily - 6 x weekly - 2 sets - 5 reps - Seated Table Hamstring Stretch  - 1-2 x daily - 7 x weekly - 1 sets - 3 reps - 30 seconds hold - Supine Quadriceps Stretch with Strap on Table  - 1-2 x daily - 7 x weekly - 1 sets - 3 reps - 30 seconds hold - Gastroc Stretch on Step  - 1-2 x daily - 7 x weekly - 1 sets - 3 reps - 30 seconds hold  ASSESSMENT:  CLINICAL IMPRESSION:  Session focused on teaching her gym equipment that she can use at [facility] at discharge. She showed good understanding of these and  we will review at her last session Thursday.   OBJECTIVE IMPAIRMENTS: decreased activity tolerance, difficulty walking, decreased balance, decreased endurance, decreased mobility, decreased ROM, decreased strength, impaired flexibility, impaired LE use, postural dysfunction, and pain.  ACTIVITY LIMITATIONS: bending, lifting, carry, locomotion, cleaning, community activity, driving,  PERSONAL FACTORS: see above PMH are also affecting patient's functional outcome.  REHAB POTENTIAL: Good  CLINICAL DECISION MAKING: Stable/uncomplicated  EVALUATION COMPLEXITY: Low    GOALS: Short term PT Goals Target date: 02/14/2024   Pt will be I and compliant with HEP. Baseline:  Goal status: Met 02/01/2024 Pt will be able to stand from 18 inch chair without UE support Baseline: Goal status: Met 02/08/2024  Long term PT goals Target date: 03/13/2024   Pt will improve Rt knee AROM to [facility] (0-110 deg) to improve functional mobility Baseline: Goal status: On Going 02/19/2024 Pt will improve  hip/knee strength to at least 5-/5 MMT to improve functional strength Baseline: Goal status: MET 02/19/24 Pt will improve FOTO to at least % functional to show improved function Baseline: Goal status: MET 02/08/2024 Pt will reduce pain to overall less than 2-3/10 with usual activity Baseline: Goal status: On Going 02/19/2024 Pt will be able to ambulate community distances at  least 500 ft WNL gait pattern and independently without complaints Baseline: Goal status: On Going 02/19/2024  PLAN: PT FREQUENCY: 1-3 times per week   PT DURATION: 4-5 weeks  PLANNED INTERVENTIONS (unless contraindicated):  97110-Therapeutic exercises, 97530- Therapeutic activity, 97112- Neuromuscular re-education, 97535- Self Care, 16109- Manual therapy, [id number]- Gait training, [id number]- Aquatic Therapy, 97016- Vasopneumatic device, [id number]- Ultrasound, and Balance training  PLAN FOR NEXT SESSION: plan to DC Thursday.   April Manson, PT, DPT 02/26/2024, 11:43 AM     Referring diagnosis? S/P total knee arthroplasty, right  - Primary [phone]  Treatment diagnosis? (if different than referring diagnosis) m25.561 What was this (referring dx) caused by? [x]  Surgery []  Fall []  Ongoing issue []  Arthritis []  Other: ____________  Laterality: [x]  Rt []  Lt []  Both  Check all possible CPT codes:  *CHOOSE 10 OR LESS*    See Planned Interventions listed in the Plan section of the Evaluation.

## 2024-02-28 ENCOUNTER — Ambulatory Visit: Payer: Medicare PPO | Admitting: Physical Therapy

## 2024-02-28 ENCOUNTER — Encounter: Payer: Self-pay | Admitting: Physical Therapy

## 2024-02-28 DIAGNOSIS — M25661 Stiffness of right knee, not elsewhere classified: Secondary | ICD-10-CM

## 2024-02-28 DIAGNOSIS — R2689 Other abnormalities of gait and mobility: Secondary | ICD-10-CM

## 2024-02-28 DIAGNOSIS — M6281 Muscle weakness (generalized): Secondary | ICD-10-CM

## 2024-02-28 DIAGNOSIS — M25561 Pain in right knee: Secondary | ICD-10-CM

## 2024-02-28 DIAGNOSIS — R600 Localized edema: Secondary | ICD-10-CM | POA: Diagnosis not present

## 2024-02-28 NOTE — Therapy (Signed)
 OUTPATIENT PHYSICAL THERAPY  TREATMENT/DISCHARGE PHYSICAL THERAPY DISCHARGE SUMMARY  Visits from Start of Care: 16  Current functional level related to goals / functional outcomes: See below   Remaining deficits: See below   Education / Equipment: HEP  Plan:  Patient goals were met. Patient is being discharged due to meeting treatment goals       Patient Name: Tamara Hughes MRN: [id number] DOB:Jun 10, 1951, 73 y.o., female Today's Date: 02/28/2024  END OF SESSION:  PT End of Session - 02/28/24 1108     Visit Number 16    Number of Visits 16    Date for PT Re-Evaluation 03/13/24    Authorization Type [employer]    Authorization Time Period -03/13/2024    Authorization - Number of Visits 10    Progress Note Due on Visit 12    PT Start Time 1105    PT Stop Time 1143    PT Time Calculation (min) 38 min    Activity Tolerance Patient tolerated treatment well;No increased pain    Behavior During Therapy WFL for tasks assessed/performed                  Past Medical History:  Diagnosis Date   Allergy    Anxiety    Arthritis    Depression    Difficult intubation    pt states she was told it was hard to intubate her due to "it not being straight." This was only told to her during her hysteroscopy   Dyspnea    pt states this is usually due to weather, and she may have to use her inhaler (seasonal allergies)   Family history of breast cancer    Hyperlipidemia    Osteoarthritis of right knee    Varicose vein of leg    RLE   Past Surgical History:  Procedure Laterality Date   GUM SURGERY     HYSTEROSCOPY     KNEE SURGERY Right    TOTAL KNEE ARTHROPLASTY Right 12/31/2023   Procedure: RIGHT TOTAL KNEE ARTHROPLASTY;  Surgeon: Tarry Kos, MD;  Location: [facility] OR;  Service: Orthopedics;  Laterality: Right;   Patient Active Problem List   Diagnosis Date Noted   Primary osteoarthritis of right knee 12/31/2023   Status post total right knee replacement 12/31/2023    OA (osteoarthritis) of knee 12/31/2023   Pre-operative clearance 11/08/2023   Varicose veins of both legs with edema 09/16/2018   Depression, major, single episode, complete remission (HCC) 09/24/2017   Routine general medical examination at a health care facility 09/24/2017   Bilateral temporomandibular joint pain 04/06/2016   Disorder of left eustachian tube 04/06/2016   Otalgia of both ears 04/06/2016   Family history of breast cancer    Allergic rhinitis 07/04/2007    PCP: Shirline Frees, NP   REFERRING PROVIDER: Roda Shutters, MD  REFERRING DIAG: S/P total knee arthroplasty, right  - Primary [phone]   THERAPY DIAG:  Acute pain of right knee  Stiffness of right knee, not elsewhere classified  Other abnormalities of gait and mobility  Muscle weakness (generalized)  Localized edema  Rationale for Evaluation and Treatment: Rehabilitation  ONSET DATE: 12/31/2023; s/p R TKA  SUBJECTIVE:   SUBJECTIVE STATEMENT: States she feels ready to discharge today  PERTINENT HISTORY: Anxiety and depression, Hyperlipidemia, Vitamin D deficiency, Varicose veins of both legs with edema (2019)   PAIN:  NPRS scale: 2/10 Pain location: Rt knee Pain description: Achy Aggravating factors: At night Relieving factors: ice, exercise  PRECAUTIONS: Knee  WEIGHT BEARING RESTRICTIONS: No  FALLS:  Has patient fallen in last 6 months? No  LIVING ENVIRONMENT: 4 steps to enter house, with handrail on Right  OCCUPATION: retired  PLOF: Independent, did not use AD   PATIENT GOALS: walk without the walkder, want to be able to squat  Next MD visit: none scheduled yet, saw Dr. this morning at 10am  OBJECTIVE:  DIAGNOSTIC FINDINGS:  Interval total right knee arthroplasty. No perihardware lucency is seen to indicate hardware failure or loosening. Mild-to-moderate joint effusion. Expected postoperative intra-articular and anterior subcutaneous air. No acute fracture or dislocation.    IMPRESSION: Interval total right knee arthroplasty without evidence of hardware failure.  PATIENT SURVEYS:  02/08/2024 FOTO 75 (Goal met)  01/17/2024 FOTO intake:58% functional    predicted:  69  COGNITION: 01/17/2024 Overall cognitive status: [facility]    EDEMA:  01/17/2024 Moderate edema noted in Right knee  LOWER EXTREMITY ROM:   AROM/PROM Right 01/17/2024 Right 01/24/24 Right 01/30/24 Right 02/01/2024 Right 02/05/24 Right 02/08/2024 Right 02/14/2024 Right 02/28/24  Knee flexion 65/80 /95 85/93 Active 98 P:102 Active 99 A: 100 P: 104 A:115 P:120  Knee extension 10/8   Active 5  Active 3  A:2   (Blank rows = not tested)  LOWER EXTREMITY MMT:  MMT Right 01/17/2024 Right 02/08/2024 Right 02/28/24  Hip flexion 4  5  Knee flexion 4  5  Knee extension 4 In pounds 48.4/26.6 5   (Blank rows = not tested)   FUNCTIONAL TESTS:  01/17/2024 18 inch chair transfer: needs UE support to stand  GAIT: 01/17/2024 Distance walked: 75 feet Assistive device utilized: Environmental consultant - 2 wheeled Level of assistance: Complete Independence  02/28/24 Gait now WNL                     TODAY'S TREATMENT 02/26/24 Recumbent bike seat # 5, 10 min full revolutions with BLEs only for ROM and endurance Treadmill 1.5 mph X 5 min Upadated FOTO score, measurments, and goals Leg extension machine 15# DL [id number], then Right leg only 5# [id number] Leg curl machine DL 11# [id number] Gym equipment education and review    PATIENT EDUCATION: Education details: HEP, PT plan of care Person educated: Patient Education method: Explanation, Demonstration, Verbal cues, and Handouts Education comprehension: verbalized understanding and needs further education   HOME EXERCISE PROGRAM: Access Code: [email or web address] URL: [email or web address] Date: 02/14/2024 Prepared by: Vladimir Faster   Exercises - Supine Heel Slide with Strap  - 3 x daily - 6 x weekly - 1 sets - 10 reps - 5 hold - Supine Active Straight Leg Raise  - 2 x daily - 6  x weekly - 1-2 sets - 10 reps - Supine Knee Extension Stretch on Towel Roll  - 3 x daily - 6 x weekly - 1 sets - 10 reps - 5 sec hold - Seated Knee Flexion AAROM  - 3 x daily - 6 x weekly - 1 sets - 10 reps - 5 hold - Sit to Stand with Armchair  - 2 x daily - 6 x weekly - 2 sets - 5 reps - Seated Table Hamstring Stretch  - 1-2 x daily - 7 x weekly - 1 sets - 3 reps - 30 seconds hold - Supine Quadriceps Stretch with Strap on Table  - 1-2 x daily - 7 x weekly - 1 sets - 3 reps - 30 seconds hold - Gastroc Stretch on Step  - 1-2 x daily - 7 x  weekly - 1 sets - 3 reps - 30 seconds hold  ASSESSMENT:  CLINICAL IMPRESSION:  She has made excellent progress and has met PT goals. She will be discharged to independent program and had no further questions or concerns.  OBJECTIVE IMPAIRMENTS: decreased activity tolerance, difficulty walking, decreased balance, decreased endurance, decreased mobility, decreased ROM, decreased strength, impaired flexibility, impaired LE use, postural dysfunction, and pain.  ACTIVITY LIMITATIONS: bending, lifting, carry, locomotion, cleaning, community activity, driving,  PERSONAL FACTORS: see above PMH are also affecting patient's functional outcome.  REHAB POTENTIAL: Good  CLINICAL DECISION MAKING: Stable/uncomplicated  EVALUATION COMPLEXITY: Low    GOALS: Short term PT Goals Target date: 02/14/2024   Pt will be I and compliant with HEP. Baseline:  Goal status: Met 02/01/2024 Pt will be able to stand from 18 inch chair without UE support Baseline: Goal status: Met 02/08/2024  Long term PT goals Target date: 03/13/2024   Pt will improve Rt knee AROM to [facility] (0-110 deg) to improve functional mobility Baseline: Goal status: Mostly met, now 2-115 02/27/14 Pt will improve  hip/knee strength to at least 5-/5 MMT to improve functional strength Baseline: Goal status: MET 02/19/24 Pt will improve FOTO to at least 69% functional to show improved  function Baseline: Goal status: MET 02/08/2024 Pt will reduce pain to overall less than 2-3/10 with usual activity Baseline: Goal status: MET 02/28/24 Pt will be able to ambulate community distances at least 500 ft WNL gait pattern and independently without complaints Baseline: Goal status: MET 02/28/24  PLAN: PT FREQUENCY: 1-3 times per week   PT DURATION: 4-5 weeks  PLANNED INTERVENTIONS (unless contraindicated):  97110-Therapeutic exercises, 97530- Therapeutic activity, 97112- Neuromuscular re-education, 97535- Self Care, 16109- Manual therapy, [id number]- Gait training, [phone]- Aquatic Therapy, [id number]- Vasopneumatic device, [id number]- Ultrasound, and Balance training  PLAN FOR NEXT SESSION: DC today   April Manson, PT, DPT 02/28/2024, 11:10 AM     Referring diagnosis? S/P total knee arthroplasty, right  - Primary [phone]  Treatment diagnosis? (if different than referring diagnosis) m25.561 What was this (referring dx) caused by? [x]  Surgery []  Fall []  Ongoing issue []  Arthritis []  Other: ____________  Laterality: [x]  Rt []  Lt []  Both  Check all possible CPT codes:  *CHOOSE 10 OR LESS*    See Planned Interventions listed in the Plan section of the Evaluation.

## 2024-03-03 ENCOUNTER — Encounter: Payer: Self-pay | Admitting: Orthopaedic Surgery

## 2024-03-04 ENCOUNTER — Other Ambulatory Visit: Payer: Self-pay | Admitting: Physician Assistant

## 2024-03-04 MED ORDER — CLINDAMYCIN HCL 300 MG PO CAPS: ORAL_CAPSULE | ORAL | 2 refills | Status: DC

## 2024-03-04 NOTE — Telephone Encounter (Signed)
 Yes.  Sent in meds with refill

## 2024-03-15 ENCOUNTER — Other Ambulatory Visit: Payer: Self-pay | Admitting: Adult Health

## 2024-03-15 DIAGNOSIS — F419 Anxiety disorder, unspecified: Secondary | ICD-10-CM

## 2024-03-25 ENCOUNTER — Ambulatory Visit (INDEPENDENT_AMBULATORY_CARE_PROVIDER_SITE_OTHER): Payer: Medicare PPO | Admitting: Physician Assistant

## 2024-03-25 DIAGNOSIS — Z96651 Presence of right artificial knee joint: Secondary | ICD-10-CM

## 2024-03-25 NOTE — Progress Notes (Signed)
 Post-Op Visit Note   Patient: Tamara Hughes           Date of Birth: 01-02-1951           MRN: [id number] Visit Date: 03/25/2024 PCP: Shirline Frees, NP   Assessment & Plan:  Chief Complaint:  Chief Complaint  Patient presents with   Right Knee - Routine Post Op   Visit Diagnoses:  1. Status post total right knee replacement     Plan: Patient is a pleasant 73 year old female who comes in today 3 months status post right total knee replacement 12/31/2023.  She has been doing well in regards to pain but does note continued burning and sensitivity around the incision.  This has improved with desensitization with lotion.  She has finished physical therapy.  She continues to workout at the gym twice a week.  Examination of her right knee reveals a fully healed surgical scar without complication.  She did develop a keloid.  Calf is soft nontender.  Range of motion 0 to 115 degrees.  Stable to valgus varus stress.  She is neurovascularly intact distally.  At this point, she will continue to increase activity as tolerated.  Dental prophylaxis reinforced.  Follow-up in 3 months for repeat evaluation and 2 view x-rays of the right knee.  Call with concerns or questions.  Follow-Up Instructions: Return in about 3 months (around 06/24/2024).   Orders:  No orders of the defined types were placed in this encounter.  No orders of the defined types were placed in this encounter.   Imaging: No new imaging  PMFS History: Patient Active Problem List   Diagnosis Date Noted   Primary osteoarthritis of right knee 12/31/2023   Status post total right knee replacement 12/31/2023   OA (osteoarthritis) of knee 12/31/2023   Pre-operative clearance 11/08/2023   Varicose veins of both legs with edema 09/16/2018   Depression, major, single episode, complete remission (HCC) 09/24/2017   Routine general medical examination at a health care facility 09/24/2017   Bilateral temporomandibular joint pain  04/06/2016   Disorder of left eustachian tube 04/06/2016   Otalgia of both ears 04/06/2016   Family history of breast cancer    Allergic rhinitis 07/04/2007   Past Medical History:  Diagnosis Date   Allergy    Anxiety    Arthritis    Depression    Difficult intubation    pt states she was told it was hard to intubate her due to "it not being straight." This was only told to her during her hysteroscopy   Dyspnea    pt states this is usually due to weather, and she may have to use her inhaler (seasonal allergies)   Family history of breast cancer    Hyperlipidemia    Osteoarthritis of right knee    Varicose vein of leg    RLE    Family History  Problem Relation Age of Onset   Breast cancer Mother 58   Bone cancer Father 66   Breast cancer Sister 32   Heart attack Sister    Diabetes Sister    Breast cancer Maternal Aunt        dx >50   Cancer Maternal Aunt        NOS   Breast cancer Other        MGF's sister #1   Hyperlipidemia Other    Hypertension Other    Thyroid disease Other    Stomach cancer Neg Hx  Rectal cancer Neg Hx    Pancreatic cancer Neg Hx    Colon cancer Neg Hx     Past Surgical History:  Procedure Laterality Date   GUM SURGERY     HYSTEROSCOPY     KNEE SURGERY Right    TOTAL KNEE ARTHROPLASTY Right 12/31/2023   Procedure: RIGHT TOTAL KNEE ARTHROPLASTY;  Surgeon: Tarry Kos, MD;  Location: [facility] OR;  Service: Orthopedics;  Laterality: Right;   Social History   Occupational History   Not on file  Tobacco Use   Smoking status: Former    Current packs/day: 0.00    Average packs/day: 1 pack/day for 15.0 years (15.0 ttl pk-yrs)    Types: Cigarettes    Start date: 04/20/1968    Quit date: 04/21/1983    Years since quitting: 40.9   Smokeless tobacco: Never  Vaping Use   Vaping status: Never Used  Substance and Sexual Activity   Alcohol use: Yes    Alcohol/week: 1.0 standard drink of alcohol    Types: 1 Cans of beer per week    Comment: maybe  one beer every couple of weeks   Drug use: No   Sexual activity: Not on file

## 2024-06-11 ENCOUNTER — Other Ambulatory Visit: Payer: Self-pay | Admitting: Adult Health

## 2024-06-11 DIAGNOSIS — F32A Depression, unspecified: Secondary | ICD-10-CM

## 2024-06-24 ENCOUNTER — Other Ambulatory Visit (INDEPENDENT_AMBULATORY_CARE_PROVIDER_SITE_OTHER): Payer: Self-pay

## 2024-06-24 ENCOUNTER — Ambulatory Visit: Admitting: Physician Assistant

## 2024-06-24 DIAGNOSIS — Z96651 Presence of right artificial knee joint: Secondary | ICD-10-CM

## 2024-06-24 NOTE — Progress Notes (Signed)
 Post-Op Visit Note   Patient: Tamara Hughes           Date of Birth: 03/13/1951           MRN: [id number] Visit Date: 06/24/2024 PCP: Merna Huxley, NP   Assessment & Plan:  Chief Complaint:  Chief Complaint  Patient presents with   Right Knee - Follow-up   Visit Diagnoses:  1. History of total right knee replacement     Plan: Patient is a pleasant 73 year old female who comes in today 6 months status post right total knee replacement 12/31/2023.  She has been doing well with the exception of continued tenderness to the incision.  She has been desensitizing this with lotion which has somewhat helped.  Examination of the right knee reveals a fully healed surgical scar.  Range of motion 0 to 120 degrees.  Stable valgus varus stress.  She is neurovascularly intact distally.  At this point, she will continue to advance with activity as tolerated.  Dental prophylaxis reinforced.  Follow-up in 6 months for repeat evaluation and 2 view x-rays of the right knee.  Call with concerns or questions.  Follow-Up Instructions: Return in about 6 months (around 12/25/2024).   Orders:  Orders Placed This Encounter  Procedures   XR Knee 1-2 Views Right   No orders of the defined types were placed in this encounter.   Imaging: XR Knee 1-2 Views Right Result Date: 06/24/2024 Well-seated prosthesis without complication   PMFS History: Patient Active Problem List   Diagnosis Date Noted   Primary osteoarthritis of right knee 12/31/2023   Status post total right knee replacement 12/31/2023   OA (osteoarthritis) of knee 12/31/2023   Pre-operative clearance 11/08/2023   Varicose veins of both legs with edema 09/16/2018   Depression, major, single episode, complete remission (HCC) 09/24/2017   Routine general medical examination at a health care facility 09/24/2017   Bilateral temporomandibular joint pain 04/06/2016   Disorder of left eustachian tube 04/06/2016   Otalgia of both ears 04/06/2016    Family history of breast cancer    Allergic rhinitis 07/04/2007   Past Medical History:  Diagnosis Date   Allergy    Anxiety    Arthritis    Depression    Difficult intubation    pt states she was told it was hard to intubate her due to it not being straight. This was only told to her during her hysteroscopy   Dyspnea    pt states this is usually due to weather, and she may have to use her inhaler (seasonal allergies)   Family history of breast cancer    Hyperlipidemia    Osteoarthritis of right knee    Varicose vein of leg    RLE    Family History  Problem Relation Age of Onset   Breast cancer Mother 79   Bone cancer Father 19   Breast cancer Sister 36   Heart attack Sister    Diabetes Sister    Breast cancer Maternal Aunt        dx >50   Cancer Maternal Aunt        NOS   Breast cancer Other        MGF's sister #1   Hyperlipidemia Other    Hypertension Other    Thyroid  disease Other    Stomach cancer Neg Hx    Rectal cancer Neg Hx    Pancreatic cancer Neg Hx    Colon cancer Neg Hx  Past Surgical History:  Procedure Laterality Date   GUM SURGERY     HYSTEROSCOPY     KNEE SURGERY Right    TOTAL KNEE ARTHROPLASTY Right 12/31/2023   Procedure: RIGHT TOTAL KNEE ARTHROPLASTY;  Surgeon: Jerri Kay HERO, MD;  Location: [facility] OR;  Service: Orthopedics;  Laterality: Right;   Social History   Occupational History   Not on file  Tobacco Use   Smoking status: Former    Current packs/day: 0.00    Average packs/day: 1 pack/day for 15.0 years (15.0 ttl pk-yrs)    Types: Cigarettes    Start date: 04/20/1968    Quit date: 04/21/1983    Years since quitting: 41.2   Smokeless tobacco: Never  Vaping Use   Vaping status: Never Used  Substance and Sexual Activity   Alcohol use: Yes    Alcohol/week: 1.0 standard drink of alcohol    Types: 1 Cans of beer per week    Comment: maybe one beer every couple of weeks   Drug use: No   Sexual activity: Not on file

## 2024-06-30 DIAGNOSIS — H52203 Unspecified astigmatism, bilateral: Secondary | ICD-10-CM | POA: Diagnosis not present

## 2024-06-30 DIAGNOSIS — H2513 Age-related nuclear cataract, bilateral: Secondary | ICD-10-CM | POA: Diagnosis not present

## 2024-08-04 ENCOUNTER — Other Ambulatory Visit: Payer: Self-pay | Admitting: Adult Health

## 2024-08-04 DIAGNOSIS — Z1231 Encounter for screening mammogram for malignant neoplasm of breast: Secondary | ICD-10-CM

## 2024-08-28 ENCOUNTER — Ambulatory Visit: Admitting: Adult Health

## 2024-08-28 ENCOUNTER — Encounter: Payer: Self-pay | Admitting: Adult Health

## 2024-08-28 ENCOUNTER — Ambulatory Visit: Payer: Self-pay | Admitting: Adult Health

## 2024-08-28 ENCOUNTER — Other Ambulatory Visit: Payer: Self-pay | Admitting: Adult Health

## 2024-08-28 VITALS — BP 138/88 | HR 62 | Temp 97.9°F | Ht 63.0 in | Wt 169.0 lb

## 2024-08-28 DIAGNOSIS — R5383 Other fatigue: Secondary | ICD-10-CM | POA: Diagnosis not present

## 2024-08-28 DIAGNOSIS — R8290 Unspecified abnormal findings in urine: Secondary | ICD-10-CM | POA: Diagnosis not present

## 2024-08-28 DIAGNOSIS — R61 Generalized hyperhidrosis: Secondary | ICD-10-CM

## 2024-08-28 DIAGNOSIS — M545 Low back pain, unspecified: Secondary | ICD-10-CM | POA: Diagnosis not present

## 2024-08-28 DIAGNOSIS — M459 Ankylosing spondylitis of unspecified sites in spine: Secondary | ICD-10-CM

## 2024-08-28 LAB — URINALYSIS, ROUTINE W REFLEX MICROSCOPIC
Bilirubin Urine: NEGATIVE
Hgb urine dipstick: NEGATIVE
Ketones, ur: NEGATIVE
Nitrite: NEGATIVE
RBC / HPF: NONE SEEN (ref 0–?)
Specific Gravity, Urine: 1.01 (ref 1.000–1.030)
Total Protein, Urine: NEGATIVE
Urine Glucose: NEGATIVE
Urobilinogen, UA: 0.2 (ref 0.0–1.0)
pH: 6.5 (ref 5.0–8.0)

## 2024-08-28 LAB — VITAMIN D 25 HYDROXY (VIT D DEFICIENCY, FRACTURES): VITD: 33.23 ng/mL (ref 30.00–100.00)

## 2024-08-28 LAB — C-REACTIVE PROTEIN: CRP: <1 mg/dL (ref 0.5–20.0)

## 2024-08-28 LAB — COMPREHENSIVE METABOLIC PANEL WITH GFR
ALT: 16 U/L (ref 0–35)
AST: 21 U/L (ref 0–37)
Albumin: 4.1 g/dL (ref 3.5–5.2)
Alkaline Phosphatase: 61 U/L (ref 39–117)
BUN: 17 mg/dL (ref 6–23)
CO2: 32 meq/L (ref 19–32)
Calcium: 9.2 mg/dL (ref 8.4–10.5)
Chloride: 102 meq/L (ref 96–112)
Creatinine, Ser: 0.68 mg/dL (ref 0.40–1.20)
GFR: 85.48 mL/min (ref >60.00)
Glucose, Bld: 77 mg/dL (ref 70–99)
Potassium: 4 meq/L (ref 3.5–5.1)
Sodium: 141 meq/L (ref 135–145)
Total Bilirubin: 0.7 mg/dL (ref 0.2–1.2)
Total Protein: 7 g/dL (ref 6.0–8.3)

## 2024-08-28 LAB — CBC
HCT: 39.9 % (ref 36.0–46.0)
Hemoglobin: 12.8 g/dL (ref 12.0–15.0)
MCHC: 32 g/dL (ref 30.0–36.0)
MCV: 81.5 fl (ref 78.0–100.0)
Platelets: 264 K/uL (ref 150.0–400.0)
RBC: 4.9 Mil/uL (ref 3.87–5.11)
RDW: 14.6 % (ref 11.5–15.5)
WBC: 5.7 K/uL (ref 4.0–10.5)

## 2024-08-28 LAB — TSH: TSH: 1.2 u[IU]/mL (ref 0.35–5.50)

## 2024-08-28 LAB — VITAMIN B12: Vitamin B-12: 318 pg/mL (ref 211–911)

## 2024-08-28 LAB — SEDIMENTATION RATE: Sed Rate: 15 mm/h (ref 0–30)

## 2024-08-28 NOTE — Progress Notes (Signed)
 Subjective:    Patient ID: Tamara Hughes, female    DOB: 1951/05/10, 73 y.o.   MRN: [id number]  HPI 73 year old female who  has a past medical history of Allergy, Anxiety, Arthritis, Depression, Difficult intubation, Dyspnea, Family history of breast cancer, Hyperlipidemia, Osteoarthritis of right knee, and Varicose vein of leg.  Discussed the use of AI scribe software for clinical note transcription with the patient, who gave verbal consent to proceed.  History of Present Illness   Tamara Hughes is a 73 year old female who presents with night sweats, fatigue, and low back pain.  She experiences excessive night sweats, primarily at night, occasionally during the day, without chills or significant weight loss. Fatigue is present, with a need to rest after minimal activity, and she recently slept for four hours during the day. Iron levels are normal. Low back pain initially affected the left side and now involves the middle and right side, requiring Tylenol  for relief during prolonged walking. Tingling occurs when standing too long. Her exercise at the [facility] has decreased over the past two weeks due to these symptoms. She confirms snoring at night but denies waking up gasping for breath or feeling unrested in the morning. No urinary symptoms are present.      She has not had any recent travel out of the country    Review of Systems See HPI   Past Medical History:  Diagnosis Date   Allergy    Anxiety    Arthritis    Depression    Difficult intubation    pt states she was told it was hard to intubate her due to it not being straight. This was only told to her during her hysteroscopy   Dyspnea    pt states this is usually due to weather, and she may have to use her inhaler (seasonal allergies)   Family history of breast cancer    Hyperlipidemia    Osteoarthritis of right knee    Varicose vein of leg    RLE    Social History   Socioeconomic History   Marital status: Single     Spouse name: Not on file   Number of children: 1   Years of education: Not on file   Highest education level: Bachelor's degree (e.g., BA, AB, BS)  Occupational History   Not on file  Tobacco Use   Smoking status: Former    Current packs/day: 0.00    Average packs/day: 1 pack/day for 15.0 years (15.0 ttl pk-yrs)    Types: Cigarettes    Start date: 04/20/1968    Quit date: 04/21/1983    Years since quitting: 41.3   Smokeless tobacco: Never  Vaping Use   Vaping status: Never Used  Substance and Sexual Activity   Alcohol use: Yes    Alcohol/week: 1.0 standard drink of alcohol    Types: 1 Cans of beer per week    Comment: maybe one beer every couple of weeks   Drug use: No   Sexual activity: Not on file  Other Topics Concern   Not on file  Social History Narrative   Not on file   Social Drivers of Health   Financial Resource Strain: Low Risk  (11/01/2023)   Overall Financial Resource Strain (CARDIA)    Difficulty of Paying Living Expenses: Not hard at all  Food Insecurity: No Food Insecurity (11/01/2023)   Hunger Vital Sign    Worried About Running Out of Food in the Last  Year: Never true    Ran Out of Food in the Last Year: Never true  Transportation Needs: No Transportation Needs (11/01/2023)   PRAPARE - Administrator, Civil Service (Medical): No    Lack of Transportation (Non-Medical): No  Physical Activity: Insufficiently Active (11/01/2023)   Exercise Vital Sign    Days of Exercise per Week: 1 day    Minutes of Exercise per Session: 30 min  Stress: Stress Concern Present (11/01/2023)   [employer] of Occupational Health - Occupational Stress Questionnaire    Feeling of Stress : To some extent  Social Connections: Moderately Integrated (11/01/2023)   Social Connection and Isolation Panel    Frequency of Communication with Friends and Family: Three times a week    Frequency of Social Gatherings with Friends and Family: Once a week    Attends Religious  Services: More than 4 times per year    Active Member of [employer] or Organizations: Yes    Attends Banker Meetings: More than 4 times per year    Marital Status: Never married  Intimate Partner Violence: Not At Risk (10/17/2023)   Humiliation, Afraid, Rape, and Kick questionnaire    Fear of Current or Ex-Partner: No    Emotionally Abused: No    Physically Abused: No    Sexually Abused: No    Past Surgical History:  Procedure Laterality Date   GUM SURGERY     HYSTEROSCOPY     KNEE SURGERY Right    TOTAL KNEE ARTHROPLASTY Right 12/31/2023   Procedure: RIGHT TOTAL KNEE ARTHROPLASTY;  Surgeon: Jerri Kay HERO, MD;  Location: [facility] OR;  Service: Orthopedics;  Laterality: Right;    Family History  Problem Relation Age of Onset   Breast cancer Mother 74   Bone cancer Father 60   Breast cancer Sister 41   Heart attack Sister    Diabetes Sister    Breast cancer Maternal Aunt        dx >50   Cancer Maternal Aunt        NOS   Breast cancer Other        MGF's sister #1   Hyperlipidemia Other    Hypertension Other    Thyroid  disease Other    Stomach cancer Neg Hx    Rectal cancer Neg Hx    Pancreatic cancer Neg Hx    Colon cancer Neg Hx     Allergies  Allergen Reactions   Ether     Lost all bodily fluids   Indomethacin Other (See Comments)    REACTION: upset stomach   Penicillins Hives   Doxycycline Rash    Current Outpatient Medications on File Prior to Visit  Medication Sig Dispense Refill   budesonide -formoterol  (SYMBICORT ) 160-4.5 MCG/ACT inhaler Inhale 2 puffs into the lungs 2 (two) times daily. (Patient taking differently: Inhale 2 puffs into the lungs 2 (two) times daily as needed (shortness of breath).) 1 each 0   Calcium Carbonate-Vitamin D  (CALCIUM-VITAMIN D  PO) Take 1 tablet by mouth daily.     citalopram  (CELEXA ) 40 MG tablet TAKE 1 TABLET(40 MG) BY MOUTH DAILY 90 tablet 0   clindamycin  (CLEOCIN ) 300 MG capsule Take two pills one hour prior to dental  work 8 capsule 2   docusate sodium  (COLACE) 100 MG capsule Take 1 capsule (100 mg total) by mouth daily as needed. 30 capsule 2   fluticasone  (FLONASE ) 50 MCG/ACT nasal spray INHALE 2 SPRAYS INTO BOTH NOSTRILS ONCE DAILY (Patient taking differently:  Place 2 sprays into both nostrils daily as needed for allergies.) 16 g 6   LORazepam  (ATIVAN ) 0.5 MG tablet take 1 tablet by mouth twice a day if needed 30 tablet 2   methocarbamol  (ROBAXIN -750) 750 MG tablet Take 1 tablet (750 mg total) by mouth 2 (two) times daily as needed for muscle spasms. 20 tablet 2   Multiple Vitamin (MULTIVITAMIN) tablet Take 1 tablet by mouth daily.     ondansetron  (ZOFRAN ) 4 MG tablet Take 1 tablet (4 mg total) by mouth every 8 (eight) hours as needed. 40 tablet 0   oxyCODONE  (ROXICODONE ) 5 MG immediate release tablet Take 1-2 tablets (5-10 mg total) by mouth every 8 (eight) hours as needed. To be taken after surgery 40 tablet 0   RESTASIS 0.05 % ophthalmic emulsion Place 1 drop into both eyes 2 (two) times daily.  3   rivaroxaban  (XARELTO ) 10 MG TABS tablet Take 1 tablet (10 mg total) by mouth daily. To be taken after surgery to prevent blood clots 30 tablet 0   simvastatin  (ZOCOR ) 40 MG tablet TAKE 1 TABLET(40 MG) BY MOUTH AT BEDTIME 90 tablet 3   vitamin C (ASCORBIC ACID) 250 MG tablet Take 500 mg by mouth daily.     vitamin E 180 MG (400 UNITS) capsule Take 400 Units by mouth daily.     No current facility-administered medications on file prior to visit.    BP 138/88   Pulse 62   Temp 97.9 F (36.6 C) (Oral)   Ht 5' 3 (1.6 m)   Wt 169 lb (76.7 kg)   SpO2 98%   BMI 29.94 kg/m       Objective:   Physical Exam Vitals and nursing note reviewed.  Constitutional:      Appearance: Normal appearance.  HENT:     Mouth/Throat:     Mouth: Mucous membranes are moist.     Pharynx: Oropharynx is clear.  Cardiovascular:     Rate and Rhythm: Normal rate and regular rhythm.     Pulses: Normal pulses.     Heart  sounds: Normal heart sounds.  Pulmonary:     Effort: Pulmonary effort is normal.     Breath sounds: Normal breath sounds.  Musculoskeletal:        General: Tenderness present. Normal range of motion.     Lumbar back: Tenderness present. No swelling, edema, spasms or bony tenderness. Normal range of motion.       Back:  Skin:    General: Skin is warm and dry.  Neurological:     General: No focal deficit present.     Mental Status: She is alert and oriented to person, place, and time.  Psychiatric:        Mood and Affect: Mood normal.        Behavior: Behavior normal.        Thought Content: Thought content normal.        Judgment: Judgment normal.        Assessment & Plan:  1. Other fatigue (Primary) - Can not completely r/o sleep apnea, B12 deficiency or hyperthyroidism.  This is likely spinal abscess diabetes, leukemia, or tuberculosis. - CBC; Future - Comprehensive metabolic panel with GFR; Future - Vitamin B12; Future - Sedimentation Rate; Future - C-reactive Protein; Future - TSH; Future - Urinalysis; Future - DG Chest 2 View; Future - VITAMIN D  25 Hydroxy (Vit-D Deficiency, Fractures); Future - VITAMIN D  25 Hydroxy (Vit-D Deficiency, Fractures) - Urinalysis - TSH -  C-reactive Protein - Sedimentation Rate - Vitamin B12 - Comprehensive metabolic panel with GFR - CBC  2. Night sweats  - CBC; Future - Comprehensive metabolic panel with GFR; Future - Vitamin B12; Future - Sedimentation Rate; Future - C-reactive Protein; Future - TSH; Future - Urinalysis; Future - DG Chest 2 View; Future - VITAMIN D  25 Hydroxy (Vit-D Deficiency, Fractures); Future - VITAMIN D  25 Hydroxy (Vit-D Deficiency, Fractures) - Urinalysis - TSH - C-reactive Protein - Sedimentation Rate - Vitamin B12 - Comprehensive metabolic panel with GFR - CBC  3. Acute right-sided low back pain without sciatica - Appears to be muscular in origin but she seems to have some mild radiculopathy  symptoms from time to time.  - Will get xray and possibly need advanced imagin - DG Lumbar Spine Complete; Future  Darleene Shape, NP  I personally spent a total of 34 minutes in the care of the patient today including preparing to see the patient, getting/reviewing separately obtained history, performing a medically appropriate exam/evaluation, placing orders, and documenting clinical information in the EHR.

## 2024-08-29 ENCOUNTER — Ambulatory Visit
Admission: RE | Admit: 2024-08-29 | Discharge: 2024-08-29 | Disposition: A | Discharge status: Home / Self Care | Source: Ambulatory Visit | Attending: Adult Health | Admitting: Adult Health

## 2024-08-29 DIAGNOSIS — R61 Generalized hyperhidrosis: Secondary | ICD-10-CM | POA: Diagnosis not present

## 2024-08-29 DIAGNOSIS — M5136 Other intervertebral disc degeneration, lumbar region with discogenic back pain only: Secondary | ICD-10-CM | POA: Diagnosis not present

## 2024-08-29 DIAGNOSIS — R5383 Other fatigue: Secondary | ICD-10-CM | POA: Diagnosis not present

## 2024-08-29 DIAGNOSIS — M5135 Other intervertebral disc degeneration, thoracolumbar region: Secondary | ICD-10-CM | POA: Diagnosis not present

## 2024-08-29 DIAGNOSIS — M545 Low back pain, unspecified: Secondary | ICD-10-CM

## 2024-08-29 DIAGNOSIS — M47816 Spondylosis without myelopathy or radiculopathy, lumbar region: Secondary | ICD-10-CM | POA: Diagnosis not present

## 2024-08-29 DIAGNOSIS — M47817 Spondylosis without myelopathy or radiculopathy, lumbosacral region: Secondary | ICD-10-CM | POA: Diagnosis not present

## 2024-08-29 NOTE — Telephone Encounter (Signed)
**Note De-identified  [place] Obfuscation** Please advise 

## 2024-09-02 ENCOUNTER — Other Ambulatory Visit

## 2024-09-02 DIAGNOSIS — R8290 Unspecified abnormal findings in urine: Secondary | ICD-10-CM | POA: Diagnosis not present

## 2024-09-03 LAB — URINE CULTURE
MICRO NUMBER:: 16943003
Result:: NO GROWTH
SPECIMEN QUALITY:: ADEQUATE

## 2024-09-04 ENCOUNTER — Ambulatory Visit: Payer: Self-pay | Admitting: Adult Health

## 2024-09-11 ENCOUNTER — Other Ambulatory Visit: Payer: Self-pay | Admitting: Adult Health

## 2024-09-11 DIAGNOSIS — F32A Depression, unspecified: Secondary | ICD-10-CM

## 2024-09-16 ENCOUNTER — Ambulatory Visit: Admitting: Physician Assistant

## 2024-09-16 ENCOUNTER — Other Ambulatory Visit (INDEPENDENT_AMBULATORY_CARE_PROVIDER_SITE_OTHER): Payer: Self-pay

## 2024-09-16 DIAGNOSIS — Z96651 Presence of right artificial knee joint: Secondary | ICD-10-CM

## 2024-09-16 DIAGNOSIS — M5441 Lumbago with sciatica, right side: Secondary | ICD-10-CM

## 2024-09-16 DIAGNOSIS — G8929 Other chronic pain: Secondary | ICD-10-CM | POA: Diagnosis not present

## 2024-09-16 MED ORDER — PREDNISONE 5 MG (21) PO TBPK: ORAL_TABLET | ORAL | 0 refills | Status: DC

## 2024-09-16 NOTE — Progress Notes (Signed)
 Post-Op Visit Note   Patient: Tamara Hughes           Date of Birth: [birth year]           MRN: [id number] Visit Date: 09/16/2024 PCP: Merna Huxley, NP   Assessment & Plan:  Chief Complaint:  Chief Complaint  Patient presents with   Right Knee - Pain, Follow-up   Visit Diagnoses:  1. Chronic right-sided low back pain with right-sided sciatica   2. History of total right knee replacement   3. Status post total right knee replacement     HPI Patient is a pleasant 73 year old female who comes in today approximately 8 months status post right total knee replacement 12/31/2023.  She has been doing well.  She is concerned as she has noticed a knot to the lateral aspect of the knee.  She has not increased her activity or experienced any injury.  No associated pain.  Other issue she brings up today is chronic low back pain.  Symptoms have been ongoing for many years.  Pain appears to be worse when she is doing certain exercises with her upper extremities as well as when she has been walking for a long time.  She does note occasional pain and paresthesias down the right lower extremity.  She has not been to physical therapy or taking medication for this.  No bowel or bladder change or saddle paresthesias.  No weakness to right lower extremity.  Physical exam: Well-developed well-nourished female no acute distress.  Alert and oriented x 3.  Orthopedic exam: Right knee exam:  The area of concern appears to be a small area of swelling which is from the small effusion in her knee.  This is nontender and no skin changes.  She has a fully healed surgical scar without complication.  Range of motion 0 to 120 degrees.  She is stable to valgus varus stress.  She is neurovascular intact distally.  Lumbar spine exam: No spinous tenderness.  She has mild right sided paraspinous musculature tenderness.  No focal weakness.  Negative straight leg raise.  She is neurovascularly intact distally.  Plan: Patient's  concern for right knee not is actually swelling from a small effusion within the right knee.  There is no concern for anything untoward.  She will continue to advance with activity as tolerated.  Ice and elevate for pain and swelling.  Follow-up when she is 1 year out from knee replacement surgery for repeat evaluation and 2 view x-rays of right knee.  Regards to her back, I have discussed starting on a steroid pack and initiating formal physical therapy for which she is agreeable to.  If she continues to experience symptoms, recommended follow-up with Megan Williams.  Call with concerns or questions.  Follow-Up Instructions: Return for f/u one year post-op right total knee replacement.   Orders:  Orders Placed This Encounter  Procedures   XR Knee 1-2 Views Right   Ambulatory referral to Physical Therapy   Meds ordered this encounter  Medications   predniSONE  (STERAPRED UNI-PAK 21 TAB) 5 MG (21) TBPK tablet    Sig: Take as directed    Dispense:  21 tablet    Refill:  0    Imaging: XR Knee 1-2 Views Right Result Date: 09/16/2024 Well-seated prosthesis without complication   PMFS History: Patient Active Problem List   Diagnosis Date Noted   Primary osteoarthritis of right knee 12/31/2023   Status post total right knee replacement 12/31/2023   OA (  osteoarthritis) of knee 12/31/2023   Pre-operative clearance 11/08/2023   Varicose veins of both legs with edema 09/16/2018   Depression, major, single episode, complete remission 09/24/2017   Routine general medical examination at a health care facility 09/24/2017   Bilateral temporomandibular joint pain 04/06/2016   Disorder of left eustachian tube 04/06/2016   Otalgia of both ears 04/06/2016   Family history of breast cancer    Allergic rhinitis 07/04/2007   Past Medical History:  Diagnosis Date   Allergy    Anxiety    Arthritis    Depression    Difficult intubation    pt states she was told it was hard to intubate her due to  it not being straight. This was only told to her during her hysteroscopy   Dyspnea    pt states this is usually due to weather, and she may have to use her inhaler (seasonal allergies)   Family history of breast cancer    Hyperlipidemia    Osteoarthritis of right knee    Varicose vein of leg    RLE    Family History  Problem Relation Age of Onset   Breast cancer Mother 27   Bone cancer Father 26   Breast cancer Sister 44   Heart attack Sister    Diabetes Sister    Breast cancer Maternal Aunt        dx >50   Cancer Maternal Aunt        NOS   Breast cancer Other        MGF's sister #1   Hyperlipidemia Other    Hypertension Other    Thyroid  disease Other    Stomach cancer Neg Hx    Rectal cancer Neg Hx    Pancreatic cancer Neg Hx    Colon cancer Neg Hx     Past Surgical History:  Procedure Laterality Date   GUM SURGERY     HYSTEROSCOPY     KNEE SURGERY Right    TOTAL KNEE ARTHROPLASTY Right 12/31/2023   Procedure: RIGHT TOTAL KNEE ARTHROPLASTY;  Surgeon: Jerri Kay HERO, MD;  Location: [facility] OR;  Service: Orthopedics;  Laterality: Right;   Social History   Occupational History   Not on file  Tobacco Use   Smoking status: Former    Current packs/day: 0.00    Average packs/day: 1 pack/day for 15.0 years (15.0 ttl pk-yrs)    Types: Cigarettes    Start date: 04/20/1968    Quit date: 04/21/1983    Years since quitting: 41.4   Smokeless tobacco: Never  Vaping Use   Vaping status: Never Used  Substance and Sexual Activity   Alcohol use: Yes    Alcohol/week: 1.0 standard drink of alcohol    Types: 1 Cans of beer per week    Comment: maybe one beer every couple of weeks   Drug use: No   Sexual activity: Not on file

## 2024-09-17 ENCOUNTER — Ambulatory Visit
Admission: RE | Admit: 2024-09-17 | Discharge: 2024-09-17 | Disposition: A | Discharge status: Home / Self Care | Source: Ambulatory Visit | Attending: Adult Health

## 2024-09-17 DIAGNOSIS — Z1231 Encounter for screening mammogram for malignant neoplasm of breast: Secondary | ICD-10-CM | POA: Diagnosis not present

## 2024-09-29 NOTE — Therapy (Signed)
 OUTPATIENT PHYSICAL THERAPY THORACOLUMBAR EVALUATION   Patient Name: Tamara Hughes MRN: [id number] DOB:[birth year], 73 y.o., female Today's Date: 10/01/2024  END OF SESSION:  PT End of Session - 10/01/24 0759     Visit Number 1    Number of Visits 17    Date for Recertification  11/26/24    PT Start Time 1345    PT Stop Time 1425    PT Time Calculation (min) 40 min    Activity Tolerance Patient tolerated treatment well    Behavior During Therapy [facility] for tasks assessed/performed          Past Medical History:  Diagnosis Date   Allergy    Anxiety    Arthritis    Depression    Difficult intubation    pt states she was told it was hard to intubate her due to it not being straight. This was only told to her during her hysteroscopy   Dyspnea    pt states this is usually due to weather, and she may have to use her inhaler (seasonal allergies)   Family history of breast cancer    Hyperlipidemia    Osteoarthritis of right knee    Varicose vein of leg    RLE   Past Surgical History:  Procedure Laterality Date   GUM SURGERY     HYSTEROSCOPY     KNEE SURGERY Right    TOTAL KNEE ARTHROPLASTY Right 12/31/2023   Procedure: RIGHT TOTAL KNEE ARTHROPLASTY;  Surgeon: Jerri Kay HERO, MD;  Location: [facility] OR;  Service: Orthopedics;  Laterality: Right;   Patient Active Problem List   Diagnosis Date Noted   Primary osteoarthritis of right knee 12/31/2023   Status post total right knee replacement 12/31/2023   OA (osteoarthritis) of knee 12/31/2023   Pre-operative clearance 11/08/2023   Varicose veins of both legs with edema 09/16/2018   Depression, major, single episode, complete remission 09/24/2017   Routine general medical examination at a health care facility 09/24/2017   Bilateral temporomandibular joint pain 04/06/2016   Disorder of left eustachian tube 04/06/2016   Otalgia of both ears 04/06/2016   Family history of breast cancer    Allergic rhinitis 07/04/2007    PCP:  Merna Huxley, NP   REFERRING PROVIDER: Jule Ronal CROME, PA-C   REFERRING DIAG: [id number] (ICD-10-CM) - Chronic right-sided low back pain with right-sided sciatica   Rationale for Evaluation and Treatment: Rehabilitation  THERAPY DIAG:  Muscle weakness (generalized)  Other low back pain  ONSET DATE: last couple months  SUBJECTIVE:  SUBJECTIVE STATEMENT: Long hx/o LBP.  In the last month it has gotten worse.  R>L 'Feels tight like its pulling.  May have irritated it on the leg press at the gym.  Pain decreased after Prednisone  dose pack but has increased a little since ending medication.  PERTINENT HISTORY:  No spinal surgeries.  PAIN:  Are you having pain? Yes: NPRS scale: 0>8/10 Pain location: lumbar R>L Pain description: achy Aggravating factors: bending, prolonged walking Relieving factors: medications, heat  PRECAUTIONS: None  RED FLAGS: None   WEIGHT BEARING RESTRICTIONS: No  FALLS:  Has patient fallen in last 6 months? No  LIVING ENVIRONMENT: Lives with: lives with their family Lives in: House/apartment Stairs: No Has following equipment at home: None  OCCUPATION: retired  PLOF: Independent  PATIENT GOALS: Decrease pain  NEXT MD VISIT: unknown  OBJECTIVE:  Note: Objective measures were completed at Evaluation unless otherwise noted.  DIAGNOSTIC FINDINGS:  Xray 08/29/24   IMPRESSION: 1. No acute abnormality of the lumbar spine. 2. Moderate degenerative disc disease at T12-L1. 3. Mild degenerative disc disease at L1-2 and L4-5. 4. 2 mm anterolisthesis at L4-5. 5. Facet arthrosis throughout the lumbar spine, relatively severe bilaterally at L3-S1.    PATIENT SURVEYS:  PSFS: THE PATIENT SPECIFIC FUNCTIONAL SCALE  Place score of 0-10 (0 = unable to  perform activity and 10 = able to perform activity at the same level as before injury or problem)  Activity Date: 09/30/24    Cleaning house  5    2.prolonged walking 8    3.exercise  0    4.      Total Score 4.33      Total Score = Sum of activity scores/number of activities  Minimally Detectable Change: 3 points (for single activity); 2 points (for average score)  [employer] [facility] (nd). The Patient Specific Functional Scale . Retrieved from [email or web address]   COGNITION: Overall cognitive status: Within functional limits for tasks assessed     SENSATION: WFL  MUSCLE LENGTH: Hamstrings: Right/Left Mild restriction   POSTURE: R protracted scapula, inc T kyphosis, dec axilla space on the left, feet ER  PALPATION: R QL/pvm 1+  LUMBAR ROM:   AROM eval  Flexion 100%  Extension 75%  Right lateral flexion 75%P  Left lateral flexion 100%  Right rotation 50%P  Left rotation 50%P   (Blank rows = not tested)  LOWER EXTREMITY ROM:   WFL  Active/Passive Right eval Left eval  Hip flexion    Hip extension    Hip abduction    Hip adduction    Hip internal rotation    Hip external rotation    Knee flexion    Knee extension    Ankle dorsiflexion    Ankle plantarflexion    Ankle inversion    Ankle eversion     (Blank rows = not tested)  LOWER EXTREMITY MMT:    MMT Right eval Left eval  Hip flexion 4 4  Hip extension    Hip abduction 4 4  Hip adduction    Hip internal rotation    Hip external rotation    Knee flexion    Knee extension    Ankle dorsiflexion    Ankle plantarflexion    Ankle inversion    Ankle eversion     (Blank rows = not tested)  LUMBAR SPECIAL TESTS:  Straight leg raise test: Negative  FUNCTIONAL TESTS:  5 times sit to stand: 12 no use of UE  GAIT: Distance walked:  community Assistive device utilized: None Level of assistance: Complete Independence Comments:  --  TREATMENT DATE:  09/30/24 Initial evaluation completed followed by instruction and trial set of HEP.                                                                                                                                 PATIENT EDUCATION:  Education details: HEP Person educated: Patient Education method: Solicitor, and Actor cues Education comprehension: verbalized understanding, returned demonstration, and verbal cues required  HOME EXERCISE PROGRAM: Access Code: [email or web address] URL: [email or web address] Date: 09/30/2024 Prepared by: Burnard Meth  Exercises - Supine Hip Adduction Isometric with Ball  - 2 x daily - 7 x weekly - 1 sets - 10 reps - 10 hold - Hooklying Clamshell with Resistance  - 2 x daily - 7 x weekly - 2 sets - 10 reps - Supine Bridge  - 2 x daily - 7 x weekly - 2 sets - 10 reps - 3 hold - Supine Posterior Pelvic Tilt  - 2 x daily - 7 x weekly - 2 sets - 10 reps - 3 hold  ASSESSMENT:  CLINICAL IMPRESSION: Patient is a 73 y.o. female who was seen today for physical therapy evaluation and treatment for lumbar spine pain/sciatica.  She presents with painful ROM, decreased strength and decreased exercise ability due to pain. She would benefit from skilled PT to address theses deficits and return to previous LOA.  OBJECTIVE IMPAIRMENTS: decreased ROM, decreased strength, impaired flexibility, and pain.   ACTIVITY LIMITATIONS: bending, standing, and squatting  PARTICIPATION LIMITATIONS: community activity and exercise  PERSONAL FACTORS: None are also affecting patient's functional outcome.   REHAB POTENTIAL: Good  CLINICAL DECISION MAKING: Stable/uncomplicated  EVALUATION COMPLEXITY: Moderate   GOALS: Goals reviewed with patient? Yes  SHORT TERM GOALS: Target date: 10/22/2024    Pt to be independent with HEP.   Baseline: Goal status: INITIAL  2.  Decrease pain by 1 level. Baseline:  Goal status: INITIAL  LONG  TERM GOALS: Target date: 11/26/2024  8 weeks    Pt to be independent with self progressive HEP by discharge. Baseline:  Goal status: INITIAL  2.  Decrease pain to max 2/10 with all ADLs/community activities.   Baseline:  Goal status: INITIAL  3. Increase LE strength by 1/2 grade or more. Baseline:  Goal status: INITIAL  4.  Increase AROM by 25%. Baseline:  Goal status: INITIAL  5.  Normalize tenderness. Baseline: 1+ Goal status: INITIAL  PLAN:  PT FREQUENCY: 1-2x/week  PT DURATION: 8 weeks  PLANNED INTERVENTIONS: [id number]- PT Re-evaluation, 97110-Therapeutic exercises, 97530- Therapeutic activity, [id number]- Neuromuscular re-education, 97535- Self Care, [id number]- Manual therapy, [phone]- Aquatic Therapy, Patient/Family education, Balance training, Spinal mobilization, and Moist heat.  PLAN FOR NEXT SESSION: Start on there ex/Nustep.   Burnard CHRISTELLA Meth, PT 10/01/2024, 8:00 AM

## 2024-09-30 ENCOUNTER — Ambulatory Visit

## 2024-09-30 ENCOUNTER — Other Ambulatory Visit: Payer: Self-pay

## 2024-09-30 DIAGNOSIS — M5459 Other low back pain: Secondary | ICD-10-CM | POA: Diagnosis not present

## 2024-09-30 DIAGNOSIS — M6281 Muscle weakness (generalized): Secondary | ICD-10-CM

## 2024-10-19 NOTE — Therapy (Signed)
 OUTPATIENT PHYSICAL THERAPY THORACOLUMBAR TREATMENT  Patient Name: Tamara Hughes MRN: [id number] DOB:[birth year], 73 y.o., female Today's Date: 10/20/2024  END OF SESSION:  PT End of Session - 10/20/24 1640     Visit Number 2    Number of Visits 17    Date for Recertification  11/26/24    PT Start Time 1430    PT Stop Time 1510    PT Time Calculation (min) 40 min    Activity Tolerance Patient tolerated treatment well    Behavior During Therapy [facility] for tasks assessed/performed           Past Medical History:  Diagnosis Date   Allergy    Anxiety    Arthritis    Depression    Difficult intubation    pt states she was told it was hard to intubate her due to it not being straight. This was only told to her during her hysteroscopy   Dyspnea    pt states this is usually due to weather, and she may have to use her inhaler (seasonal allergies)   Family history of breast cancer    Hyperlipidemia    Osteoarthritis of right knee    Varicose vein of leg    RLE   Past Surgical History:  Procedure Laterality Date   GUM SURGERY     HYSTEROSCOPY     KNEE SURGERY Right    TOTAL KNEE ARTHROPLASTY Right 12/31/2023   Procedure: RIGHT TOTAL KNEE ARTHROPLASTY;  Surgeon: Jerri Kay HERO, MD;  Location: [facility] OR;  Service: Orthopedics;  Laterality: Right;   Patient Active Problem List   Diagnosis Date Noted   Primary osteoarthritis of right knee 12/31/2023   Status post total right knee replacement 12/31/2023   OA (osteoarthritis) of knee 12/31/2023   Pre-operative clearance 11/08/2023   Varicose veins of both legs with edema 09/16/2018   Depression, major, single episode, complete remission 09/24/2017   Routine general medical examination at a health care facility 09/24/2017   Bilateral temporomandibular joint pain 04/06/2016   Disorder of left eustachian tube 04/06/2016   Otalgia of both ears 04/06/2016   Family history of breast cancer    Allergic rhinitis 07/04/2007    PCP:  Merna Huxley, NP   REFERRING PROVIDER: Jerri Kay HERO, MD   REFERRING DIAG: [phone] (ICD-10-CM) - Chronic right-sided low back pain with right-sided sciatica   Rationale for Evaluation and Treatment: Rehabilitation  THERAPY DIAG:  Muscle weakness (generalized)  Other low back pain  ONSET DATE: last couple months  SUBJECTIVE:  SUBJECTIVE STATEMENT: Pt reports feeling a little achy today. PERTINENT HISTORY:  No spinal surgeries.  PAIN:  Are you having pain? Yes: NPRS scale: 3/10 Pain location: lumbar R>L Pain description: achy Aggravating factors: bending, prolonged walking Relieving factors: medications, heat  PRECAUTIONS: None  RED FLAGS: None   WEIGHT BEARING RESTRICTIONS: No  FALLS:  Has patient fallen in last 6 months? No  LIVING ENVIRONMENT: Lives with: lives with their family Lives in: House/apartment Stairs: No Has following equipment at home: None  OCCUPATION: retired  PLOF: Independent  PATIENT GOALS: Decrease pain  NEXT MD VISIT: unknown  OBJECTIVE:  Note: Objective measures were completed at Evaluation unless otherwise noted.  DIAGNOSTIC FINDINGS:  Xray 08/29/24   IMPRESSION: 1. No acute abnormality of the lumbar spine. 2. Moderate degenerative disc disease at T12-L1. 3. Mild degenerative disc disease at L1-2 and L4-5. 4. 2 mm anterolisthesis at L4-5. 5. Facet arthrosis throughout the lumbar spine, relatively severe bilaterally at L3-S1.    PATIENT SURVEYS:  PSFS: THE PATIENT SPECIFIC FUNCTIONAL SCALE  Place score of 0-10 (0 = unable to perform activity and 10 = able to perform activity at the same level as before injury or problem)  Activity Date: 09/30/24    Cleaning house  5    2.prolonged walking 8    3.exercise  0    4.      Total Score  4.33      Total Score = Sum of activity scores/number of activities  Minimally Detectable Change: 3 points (for single activity); 2 points (for average score)  [employer] [facility] (nd). The Patient Specific Functional Scale . Retrieved from [email or web address]   COGNITION: Overall cognitive status: Within functional limits for tasks assessed     SENSATION: WFL  MUSCLE LENGTH: Hamstrings: Right/Left Mild restriction   POSTURE: R protracted scapula, inc T kyphosis, dec axilla space on the left, feet ER  PALPATION: R QL/pvm 1+  LUMBAR ROM:   AROM eval  Flexion 100%  Extension 75%  Right lateral flexion 75%P  Left lateral flexion 100%  Right rotation 50%P  Left rotation 50%P   (Blank rows = not tested)  LOWER EXTREMITY ROM:   WFL  Active/Passive Right eval Left eval  Hip flexion    Hip extension    Hip abduction    Hip adduction    Hip internal rotation    Hip external rotation    Knee flexion    Knee extension    Ankle dorsiflexion    Ankle plantarflexion    Ankle inversion    Ankle eversion     (Blank rows = not tested)  LOWER EXTREMITY MMT:    MMT Right eval Left eval  Hip flexion 4 4  Hip extension    Hip abduction 4 4  Hip adduction    Hip internal rotation    Hip external rotation    Knee flexion    Knee extension    Ankle dorsiflexion    Ankle plantarflexion    Ankle inversion    Ankle eversion     (Blank rows = not tested)  LUMBAR SPECIAL TESTS:  Straight leg raise test: Negative  FUNCTIONAL TESTS:  5 times sit to stand: 12 no use of UE  GAIT: Distance walked: Orthoptist utilized: None Level of assistance: Complete Independence Comments: --  TREATMENT DATE:  09/30/24 Initial evaluation completed followed by instruction and trial set of HEP.  10/20/24 There ex for ROM/Strengthening Rec Bike level 1 6 min SAQ 2.5# 2x10 bil Neuro Re Ed for posture/stabilization Tband hip abduction 3x10 G Hip add with ball 10x10 sec Supine marching 2x12      PATIENT EDUCATION:  Education details: HEP Person educated: Patient Education method: Programmer, Multimedia, Demonstration, and Actor cues Education comprehension: verbalized understanding, returned demonstration, and verbal cues required  HOME EXERCISE PROGRAM: Access Code: [email or web address] URL: [email or web address] Date: 09/30/2024 Prepared by: Burnard Meth  Exercises - Supine Hip Adduction Isometric with Ball  - 2 x daily - 7 x weekly - 1 sets - 10 reps - 10 hold - Hooklying Clamshell with Resistance  - 2 x daily - 7 x weekly - 2 sets - 10 reps - Supine Bridge  - 2 x daily - 7 x weekly - 2 sets - 10 reps - 3 hold - Supine Posterior Pelvic Tilt  - 2 x daily - 7 x weekly - 2 sets - 10 reps - 3 hold  ASSESSMENT:  CLINICAL IMPRESSION: Patient needed VC for posture with HEP review and new stabilization exercises.  Demonstrated understanding. OBJECTIVE IMPAIRMENTS: decreased ROM, decreased strength, impaired flexibility, and pain.   ACTIVITY LIMITATIONS: bending, standing, and squatting  PARTICIPATION LIMITATIONS: community activity and exercise  PERSONAL FACTORS: None are also affecting patient's functional outcome.   REHAB POTENTIAL: Good  CLINICAL DECISION MAKING: Stable/uncomplicated  EVALUATION COMPLEXITY: Moderate   GOALS: Goals reviewed with patient? Yes  SHORT TERM GOALS: Target date: 10/22/2024    Pt to be independent with HEP.   Baseline: Goal status: MET 10/20/24  2.  Decrease pain by 1 level. Baseline:  Goal status: MET 10/20/24  LONG TERM GOALS: Target date: 11/26/2024  8 weeks    Pt to be independent with self progressive HEP by discharge. Baseline:  Goal status: INITIAL  2.  Decrease pain to max 2/10 with all ADLs/community  activities.   Baseline:  Goal status: INITIAL  3. Increase LE strength by 1/2 grade or more. Baseline:  Goal status: INITIAL  4.  Increase AROM by 25%. Baseline:  Goal status: INITIAL  5.  Normalize tenderness. Baseline: 1+ Goal status: INITIAL  PLAN:  PT FREQUENCY: 1-2x/week  PT DURATION: 8 weeks  PLANNED INTERVENTIONS: 97164- PT Re-evaluation, 97110-Therapeutic exercises, 97530- Therapeutic activity, [id number]- Neuromuscular re-education, 97535- Self Care, [id number]- Manual therapy, [phone]- Aquatic Therapy, Patient/Family education, Balance training, Spinal mobilization, and Moist heat.  PLAN FOR NEXT SESSION: Continue increasing spinal stabilization exercises.   Burnard CHRISTELLA Meth, PT 10/20/2024, 4:45 PM

## 2024-10-20 ENCOUNTER — Ambulatory Visit

## 2024-10-20 DIAGNOSIS — M6281 Muscle weakness (generalized): Secondary | ICD-10-CM

## 2024-10-20 DIAGNOSIS — M5459 Other low back pain: Secondary | ICD-10-CM | POA: Diagnosis not present

## 2024-10-22 ENCOUNTER — Ambulatory Visit: Payer: Medicare PPO

## 2024-10-22 VITALS — Ht 63.0 in | Wt 169.0 lb

## 2024-10-22 DIAGNOSIS — Z0000 Encounter for general adult medical examination without abnormal findings: Secondary | ICD-10-CM

## 2024-10-22 NOTE — Progress Notes (Signed)
 Subjective:   Tamara Hughes is a 73 y.o. who presents for a Medicare Wellness preventive visit.  As a reminder, Annual Wellness Visits don't include a physical exam, and some assessments may be limited, especially if this visit is performed virtually. We may recommend an in-person follow-up visit with your provider if needed.  Visit Complete: Virtual I connected with  MALEYAH EVANS on 10/22/24 by a audio enabled telemedicine application and verified that I am speaking with the correct person using two identifiers.  Patient Location: Home  Provider Location: Home Office  I discussed the limitations of evaluation and management by telemedicine. The patient expressed understanding and agreed to proceed.  Vital Signs: Because this visit was a virtual/telehealth visit, some criteria may be missing or patient reported. Any vitals not documented were not able to be obtained and vitals that have been documented are patient reported.  VideoDeclined- This patient declined Librarian, academic. Therefore the visit was completed with audio only.  Persons Participating in Visit: Patient.  AWV Questionnaire: No: Patient Medicare AWV questionnaire was not completed prior to this visit.  Cardiac Risk Factors include: advanced age (>35men, >24 women)     Objective:    Today's Vitals   10/22/24 1401  Weight: 169 lb (76.7 kg)  Height: 5' 3 (1.6 m)   Body mass index is 29.94 kg/m.     10/22/2024    2:11 PM 09/30/2024    1:44 PM 01/17/2024   11:15 AM 12/31/2023    8:52 AM 12/17/2023   11:14 AM 10/17/2023    2:12 PM 10/13/2022   11:46 AM  Advanced Directives  Does Patient Have a Medical Advance Directive? No No No No No No No  Would patient like information on creating a medical advance directive? Yes (MAU/Ambulatory/Procedural Areas - Information given) No - Patient declined No - Patient declined No - Patient declined No - Patient declined No - Patient declined No -  Patient declined    Current Medications (verified) Outpatient Encounter Medications as of 10/22/2024  Medication Sig   budesonide -formoterol  (SYMBICORT ) 160-4.5 MCG/ACT inhaler Inhale 2 puffs into the lungs 2 (two) times daily. (Patient taking differently: Inhale 2 puffs into the lungs 2 (two) times daily as needed (shortness of breath).)   Calcium Carbonate-Vitamin D  (CALCIUM-VITAMIN D  PO) Take 1 tablet by mouth daily.   citalopram  (CELEXA ) 40 MG tablet TAKE 1 TABLET(40 MG) BY MOUTH DAILY   clindamycin  (CLEOCIN ) 300 MG capsule Take two pills one hour prior to dental work   fluticasone  (FLONASE ) 50 MCG/ACT nasal spray INHALE 2 SPRAYS INTO BOTH NOSTRILS ONCE DAILY (Patient taking differently: Place 2 sprays into both nostrils daily as needed for allergies.)   LORazepam  (ATIVAN ) 0.5 MG tablet take 1 tablet by mouth twice a day if needed   methocarbamol  (ROBAXIN -750) 750 MG tablet Take 1 tablet (750 mg total) by mouth 2 (two) times daily as needed for muscle spasms.   Multiple Vitamin (MULTIVITAMIN) tablet Take 1 tablet by mouth daily.   predniSONE  (STERAPRED UNI-PAK 21 TAB) 5 MG (21) TBPK tablet Take as directed   RESTASIS 0.05 % ophthalmic emulsion Place 1 drop into both eyes 2 (two) times daily.   simvastatin  (ZOCOR ) 40 MG tablet TAKE 1 TABLET(40 MG) BY MOUTH AT BEDTIME   vitamin C (ASCORBIC ACID) 250 MG tablet Take 500 mg by mouth daily.   vitamin E 180 MG (400 UNITS) capsule Take 400 Units by mouth daily.   [DISCONTINUED] docusate sodium  (COLACE) 100  MG capsule Take 1 capsule (100 mg total) by mouth daily as needed.   [DISCONTINUED] ondansetron  (ZOFRAN ) 4 MG tablet Take 1 tablet (4 mg total) by mouth every 8 (eight) hours as needed.   [DISCONTINUED] oxyCODONE  (ROXICODONE ) 5 MG immediate release tablet Take 1-2 tablets (5-10 mg total) by mouth every 8 (eight) hours as needed. To be taken after surgery   [DISCONTINUED] rivaroxaban  (XARELTO ) 10 MG TABS tablet Take 1 tablet (10 mg total) by mouth  daily. To be taken after surgery to prevent blood clots   No facility-administered encounter medications on file as of 10/22/2024.    Allergies (verified) Ether, Indomethacin, Penicillins, and Doxycycline   History: Past Medical History:  Diagnosis Date   Allergy    Anxiety    Arthritis    Depression    Difficult intubation    pt states she was told it was hard to intubate her due to it not being straight. This was only told to her during her hysteroscopy   Dyspnea    pt states this is usually due to weather, and she may have to use her inhaler (seasonal allergies)   Family history of breast cancer    Hyperlipidemia    Osteoarthritis of right knee    Varicose vein of leg    RLE   Past Surgical History:  Procedure Laterality Date   GUM SURGERY     HYSTEROSCOPY     KNEE SURGERY Right    TOTAL KNEE ARTHROPLASTY Right 12/31/2023   Procedure: RIGHT TOTAL KNEE ARTHROPLASTY;  Surgeon: Jerri Kay HERO, MD;  Location: [facility] OR;  Service: Orthopedics;  Laterality: Right;   Family History  Problem Relation Age of Onset   Breast cancer Mother 62   Bone cancer Father 83   Breast cancer Sister 87   Heart attack Sister    Diabetes Sister    Breast cancer Maternal Aunt        dx >50   Cancer Maternal Aunt        NOS   Breast cancer Other        MGF's sister #1   Hyperlipidemia Other    Hypertension Other    Thyroid  disease Other    Stomach cancer Neg Hx    Rectal cancer Neg Hx    Pancreatic cancer Neg Hx    Colon cancer Neg Hx    Social History   Socioeconomic History   Marital status: Single    Spouse name: Not on file   Number of children: 1   Years of education: Not on file   Highest education level: Bachelor's degree (e.g., BA, AB, BS)  Occupational History   Not on file  Tobacco Use   Smoking status: Former    Current packs/day: 0.00    Average packs/day: 1 pack/day for 15.0 years (15.0 ttl pk-yrs)    Types: Cigarettes    Start date: 04/20/1968    Quit date:  04/21/1983    Years since quitting: 41.5   Smokeless tobacco: Never  Vaping Use   Vaping status: Never Used  Substance and Sexual Activity   Alcohol use: Yes    Alcohol/week: 1.0 standard drink of alcohol    Types: 1 Cans of beer per week    Comment: maybe one beer every couple of weeks   Drug use: No   Sexual activity: Not on file  Other Topics Concern   Not on file  Social History Narrative   Not on file   Social Drivers  of Health   Financial Resource Strain: Low Risk  (10/22/2024)   Overall Financial Resource Strain (CARDIA)    Difficulty of Paying Living Expenses: Not hard at all  Food Insecurity: No Food Insecurity (10/22/2024)   Hunger Vital Sign    Worried About Running Out of Food in the Last Year: Never true    Ran Out of Food in the Last Year: Never true  Transportation Needs: No Transportation Needs (10/22/2024)   PRAPARE - Administrator, Civil Service (Medical): No    Lack of Transportation (Non-Medical): No  Physical Activity: Inactive (10/22/2024)   Exercise Vital Sign    Days of Exercise per Week: 0 days    Minutes of Exercise per Session: 0 min  Stress: No Stress Concern Present (10/22/2024)   [employer] of Occupational Health - Occupational Stress Questionnaire    Feeling of Stress: Only a little  Social Connections: Moderately Integrated (10/22/2024)   Social Connection and Isolation Panel    Frequency of Communication with Friends and Family: Three times a week    Frequency of Social Gatherings with Friends and Family: Once a week    Attends Religious Services: More than 4 times per year    Active Member of [employer] or Organizations: Yes    Attends Engineer, Structural: More than 4 times per year    Marital Status: Never married    Tobacco Counseling Counseling given: Not Answered    Clinical Intake:  Pre-visit preparation completed: Yes  Pain : No/denies pain  Diabetes: No  Lab Results  Component Value Date    HGBA1C 5.9 11/02/2022   HGBA1C 6.0 11/01/2021   HGBA1C 5.6 10/20/2020     How often do you need to have someone help you when you read instructions, pamphlets, or other written materials from your doctor or pharmacy?: 1 - Never  Interpreter Needed?: No  Information entered by :: Charmaine Bloodgood LPN   Activities of Daily Living     10/22/2024    2:10 PM 12/17/2023   11:16 AM  In your present state of health, do you have any difficulty performing the following activities:  Hearing? 0   Vision? 0   Difficulty concentrating or making decisions? 0   Walking or climbing stairs? 0   Dressing or bathing? 0   Doing errands, shopping? 0 0  Preparing Food and eating ? N   Using the Toilet? N   In the past six months, have you accidently leaked urine? N   Do you have problems with loss of bowel control? N   Managing your Medications? N   Managing your Finances? N   Housekeeping or managing your Housekeeping? N     Patient Care Team: Merna Huxley, NP as PCP - General (Family Medicine) Robinson Idol, MD as Consulting Physician (Ophthalmology) Jule Ronal CROME, PA-C as Physician Assistant (Orthopedic Surgery)  I have updated your Care Teams any recent Medical Services you may have received from other providers in the past year.     Assessment:   This is a routine wellness examination for Rodnesha.  Hearing/Vision screen Hearing Screening - Comments:: Patient is able to hear conversational tones without difficulty. No issues reported.   Vision Screening - Comments:: Wears rx glasses - up to date with routine eye exams with Dr. Georgianne    Goals Addressed             This Visit's Progress    Maintain health and independence   On  track      Depression Screen     10/22/2024    2:08 PM 10/17/2023    2:18 PM 11/02/2022   11:10 AM 10/13/2022   11:42 AM 06/21/2022   10:50 AM 06/13/2022    2:56 PM 11/01/2021   10:50 AM  PHQ 2/9 Scores  PHQ - 2 Score 0 0 0 0 0 0 0  PHQ- 9 Score    0  3 0     Fall Risk     10/22/2024    2:10 PM 11/08/2023    9:57 AM 10/17/2023    2:20 PM 10/11/2023    2:35 PM 05/20/2023   11:53 PM  Fall Risk   Falls in the past year? 0 1 1 1  0  Number falls in past yr: 0 0 0 0   Injury with Fall? 0 0 0 0   Risk for fall due to : No Fall Risks History of fall(s) No Fall Risks    Follow up Falls prevention discussed;Education provided;Falls evaluation completed Falls evaluation completed Falls prevention discussed      MEDICARE RISK AT HOME:  Medicare Risk at Home Any stairs in or around the home?: No If so, are there any without handrails?: No Home free of loose throw rugs in walkways, pet beds, electrical cords, etc?: Yes Adequate lighting in your home to reduce risk of falls?: Yes Life alert?: No Use of a cane, walker or w/c?: No Grab bars in the bathroom?: Yes Shower chair or bench in shower?: No Elevated toilet seat or a handicapped toilet?: Yes  TIMED UP AND GO:  Was the test performed?  No  Cognitive Function: 6CIT completed        10/22/2024    2:11 PM 10/17/2023    2:12 PM 10/13/2022   11:46 AM  6CIT Screen  What Year? 0 points 0 points 0 points  What month? 0 points 0 points 0 points  What time? 0 points 0 points 0 points  Count back from 20 0 points 0 points 0 points  Months in reverse 0 points 0 points 0 points  Repeat phrase 0 points 0 points 0 points  Total Score 0 points 0 points 0 points    Immunizations Immunization History  Administered Date(s) Administered   Fluad Quad(high Dose 65+) 09/24/2019, 10/20/2020, 11/01/2021, 11/02/2022   Fluad Trivalent(High Dose 65+) 11/08/2023   Hep A, Unspecified 04/26/2015, 10/19/2015   INFLUENZA, HIGH DOSE SEASONAL PF 09/28/2015, 09/13/2016, 09/24/2017   Influenza Split 09/19/2012   Influenza,inj,Quad PF,6+ Mos 09/24/2014, 09/16/2018   Moderna Sars-Covid-2 Vaccination 02/16/2020, 03/23/2020, 10/27/2020, 03/27/2021, 10/12/2022   Pneumococcal Conjugate-13 09/24/2017    Pneumococcal Polysaccharide-23 09/24/2019   RSV,unspecified 10/18/2023   Td 12/25/2004, 09/28/2015   Zoster Recombinant(Shingrix) 02/02/2020, 04/05/2020    Screening Tests Health Maintenance  Topic Date Due   Influenza Vaccine  07/25/2024   COVID-19 Vaccine (6 - 2025-26 season) 08/25/2024   Mammogram  09/17/2025   DTaP/Tdap/Td (3 - Tdap) 09/27/2025   Medicare Annual Wellness (AWV)  10/22/2025   Colonoscopy  12/28/2027   Pneumococcal Vaccine: 50+ Years  Completed   DEXA SCAN  Completed   Hepatitis C Screening  Completed   Zoster Vaccines- Shingrix  Completed   Meningococcal B Vaccine  Aged Out    Health Maintenance Items Addressed: Vaccines Due: Flu  Additional Screening:  Vision Screening: Recommended annual ophthalmology exams for early detection of glaucoma and other disorders of the eye. Is the patient up to date with their annual  eye exam?  Yes  Who is the provider or what is the name of the office in which the patient attends annual eye exams? Dr. Robinson   Dental Screening: Recommended annual dental exams for proper oral hygiene  Community Resource Referral / Chronic Care Management: CRR required this visit?  No   CCM required this visit?  No   Plan:    I have personally reviewed and noted the following in the patient's chart:   Medical and social history Use of alcohol, tobacco or illicit drugs  Current medications and supplements including opioid prescriptions. Patient is not currently taking opioid prescriptions. Functional ability and status Nutritional status Physical activity Advanced directives List of other physicians Hospitalizations, surgeries, and ER visits in previous 12 months Vitals Screenings to include cognitive, depression, and falls Referrals and appointments  In addition, I have reviewed and discussed with patient certain preventive protocols, quality metrics, and best practice recommendations. A written personalized care plan for  preventive services as well as general preventive health recommendations were provided to patient.   Lavelle Pfeiffer [place], [place]   [id number]   After Visit Summary: (MyChart) Due to this being a telephonic visit, the after visit summary with patients personalized plan was offered to patient via MyChart   Notes: Nothing significant to report at this time.

## 2024-10-22 NOTE — Therapy (Signed)
 OUTPATIENT PHYSICAL THERAPY THORACOLUMBAR TREATMENT  Patient Name: Tamara Hughes MRN: [id number] DOB:04-16-1951, 73 y.o., female Today's Date: 10/23/2024  END OF SESSION:  PT End of Session - 10/23/24 1449     Visit Number 3    Number of Visits 17    Date for Recertification  11/26/24    PT Start Time 1430    PT Stop Time 1510    PT Time Calculation (min) 40 min            Past Medical History:  Diagnosis Date   Allergy    Anxiety    Arthritis    Depression    Difficult intubation    pt states she was told it was hard to intubate her due to it not being straight. This was only told to her during her hysteroscopy   Dyspnea    pt states this is usually due to weather, and she may have to use her inhaler (seasonal allergies)   Family history of breast cancer    Hyperlipidemia    Osteoarthritis of right knee    Varicose vein of leg    RLE   Past Surgical History:  Procedure Laterality Date   GUM SURGERY     HYSTEROSCOPY     KNEE SURGERY Right    TOTAL KNEE ARTHROPLASTY Right 12/31/2023   Procedure: RIGHT TOTAL KNEE ARTHROPLASTY;  Surgeon: Jerri Kay HERO, MD;  Location: [facility] OR;  Service: Orthopedics;  Laterality: Right;   Patient Active Problem List   Diagnosis Date Noted   Primary osteoarthritis of right knee 12/31/2023   Status post total right knee replacement 12/31/2023   OA (osteoarthritis) of knee 12/31/2023   Pre-operative clearance 11/08/2023   Varicose veins of both legs with edema 09/16/2018   Depression, major, single episode, complete remission 09/24/2017   Routine general medical examination at a health care facility 09/24/2017   Bilateral temporomandibular joint pain 04/06/2016   Disorder of left eustachian tube 04/06/2016   Otalgia of both ears 04/06/2016   Family history of breast cancer    Allergic rhinitis 07/04/2007    PCP: Merna Huxley, NP   REFERRING PROVIDER: Jerri Kay HERO, MD   REFERRING DIAG: [phone] (ICD-10-CM) - Chronic  right-sided low back pain with right-sided sciatica   Rationale for Evaluation and Treatment: Rehabilitation  THERAPY DIAG:  Muscle weakness (generalized)  Other low back pain  Acute pain of right knee  Stiffness of right knee, not elsewhere classified  Other abnormalities of gait and mobility  ONSET DATE: last couple months  SUBJECTIVE:  SUBJECTIVE STATEMENT: Pt reports feeling some soreness in LE after last visit. PERTINENT HISTORY:  No spinal surgeries.  PAIN:  Are you having pain? Yes: NPRS scale: 3/10 Pain location: lumbar R>L Pain description: achy Aggravating factors: bending, prolonged walking Relieving factors: medications, heat  PRECAUTIONS: None  RED FLAGS: None   WEIGHT BEARING RESTRICTIONS: No  FALLS:  Has patient fallen in last 6 months? No  LIVING ENVIRONMENT: Lives with: lives with their family Lives in: House/apartment Stairs: No Has following equipment at home: None  OCCUPATION: retired  PLOF: Independent  PATIENT GOALS: Decrease pain  NEXT MD VISIT: unknown  OBJECTIVE:  Note: Objective measures were completed at Evaluation unless otherwise noted.  DIAGNOSTIC FINDINGS:  Xray 08/29/24   IMPRESSION: 1. No acute abnormality of the lumbar spine. 2. Moderate degenerative disc disease at T12-L1. 3. Mild degenerative disc disease at L1-2 and L4-5. 4. 2 mm anterolisthesis at L4-5. 5. Facet arthrosis throughout the lumbar spine, relatively severe bilaterally at L3-S1.    PATIENT SURVEYS:  PSFS: THE PATIENT SPECIFIC FUNCTIONAL SCALE  Place score of 0-10 (0 = unable to perform activity and 10 = able to perform activity at the same level as before injury or problem)  Activity Date: 09/30/24    Cleaning house  5    2.prolonged walking 8    3.exercise  0     4.      Total Score 4.33      Total Score = Sum of activity scores/number of activities  Minimally Detectable Change: 3 points (for single activity); 2 points (for average score)  [facility] (nd). The Patient Specific Functional Scale . Retrieved from [email or web address]   COGNITION: Overall cognitive status: Within functional limits for tasks assessed     SENSATION: WFL  MUSCLE LENGTH: Hamstrings: Right/Left Mild restriction   POSTURE: R protracted scapula, inc T kyphosis, dec axilla space on the left, feet ER  PALPATION: R QL/pvm 1+  LUMBAR ROM:   AROM eval  Flexion 100%  Extension 75%  Right lateral flexion 75%P  Left lateral flexion 100%  Right rotation 50%P  Left rotation 50%P   (Blank rows = not tested)  LOWER EXTREMITY ROM:   WFL  Active/Passive Right eval Left eval  Hip flexion    Hip extension    Hip abduction    Hip adduction    Hip internal rotation    Hip external rotation    Knee flexion    Knee extension    Ankle dorsiflexion    Ankle plantarflexion    Ankle inversion    Ankle eversion     (Blank rows = not tested)  LOWER EXTREMITY MMT:    MMT Right eval Left eval  Hip flexion 4 4  Hip extension    Hip abduction 4 4  Hip adduction    Hip internal rotation    Hip external rotation    Knee flexion    Knee extension    Ankle dorsiflexion    Ankle plantarflexion    Ankle inversion    Ankle eversion     (Blank rows = not tested)  LUMBAR SPECIAL TESTS:  Straight leg raise test: Negative  FUNCTIONAL TESTS:  5 times sit to stand: 12 no use of UE  GAIT: Distance walked: Medical Sales Representative device utilized: None Level of assistance: Complete Independence Comments: --  TREATMENT DATE:  10/23/24 There ex for ROM/Strengthening Rec Bike level 1 6 min SAQ 3# 2x10 bil Neuro Re Ed for posture/stabilization Tband  hip abduction 3x10 G Bridges with Tband 3x10 Hip  add with ball 10x10 sec Supine marching 2x12   10/20/24 There ex for ROM/Strengthening Rec Bike level 1 6 min SAQ 2.5# 2x10 bil Neuro Re Ed for posture/stabilization Tband hip abduction 3x10 G Hip add with ball 10x10 sec Supine marching 2x12   09/30/24 Initial evaluation completed followed by instruction and trial set of HEP.                                                                                                                                   PATIENT EDUCATION:  Education details: HEP Person educated: Patient Education method: Solicitor, and Actor cues Education comprehension: verbalized understanding, returned demonstration, and verbal cues required  HOME EXERCISE PROGRAM: Access Code: [email or web address] URL: [email or web address] Date: 09/30/2024 Prepared by: Burnard Meth  Exercises - Supine Hip Adduction Isometric with Ball  - 2 x daily - 7 x weekly - 1 sets - 10 reps - 10 hold - Hooklying Clamshell with Resistance  - 2 x daily - 7 x weekly - 2 sets - 10 reps - Supine Bridge  - 2 x daily - 7 x weekly - 2 sets - 10 reps - 3 hold - Supine Posterior Pelvic Tilt  - 2 x daily - 7 x weekly - 2 sets - 10 reps - 3 hold  ASSESSMENT:  CLINICAL IMPRESSION: Patient able to tolerate increased resistance with SAQ.  Strength increasing. OBJECTIVE IMPAIRMENTS: decreased ROM, decreased strength, impaired flexibility, and pain.   ACTIVITY LIMITATIONS: bending, standing, and squatting  PARTICIPATION LIMITATIONS: community activity and exercise  PERSONAL FACTORS: None are also affecting patient's functional outcome.   REHAB POTENTIAL: Good  CLINICAL DECISION MAKING: Stable/uncomplicated  EVALUATION COMPLEXITY: Moderate   GOALS: Goals reviewed with patient? Yes  SHORT TERM GOALS: Target date: 10/22/2024    Pt to be independent with HEP.   Baseline: Goal status: MET 10/20/24  2.  Decrease pain by 1 level. Baseline:  Goal status: MET  10/20/24  LONG TERM GOALS: Target date: 11/26/2024  8 weeks    Pt to be independent with self progressive HEP by discharge. Baseline:  Goal status: INITIAL  2.  Decrease pain to max 2/10 with all ADLs/community activities.   Baseline:  Goal status: INITIAL  3. Increase LE strength by 1/2 grade or more. Baseline:  Goal status: INITIAL  4.  Increase AROM by 25%. Baseline:  Goal status: INITIAL  5.  Normalize tenderness. Baseline: 1+ Goal status: INITIAL  PLAN:  PT FREQUENCY: 1-2x/week  PT DURATION: 8 weeks  PLANNED INTERVENTIONS: 97164- PT Re-evaluation, 97110-Therapeutic exercises, 97530- Therapeutic activity, [id number]- Neuromuscular re-education, 97535- Self Care, 02859- Manual therapy, [phone]- Aquatic Therapy, Patient/Family education, Balance training, Spinal mobilization, and Moist heat.  PLAN FOR NEXT SESSION: Continue increasing spinal stabilization exercises.   Burnard CHRISTELLA Meth, PT 10/23/2024, 4:47 PM

## 2024-10-22 NOTE — Patient Instructions (Signed)
 [name],  Thank you for taking the time for your Medicare Wellness Visit. I appreciate your continued commitment to your health goals. Please review the care plan we discussed, and feel free to reach out if I can assist you further.  Medicare recommends these wellness visits once per year to help you and your care team stay ahead of potential health issues. These visits are designed to focus on prevention, allowing your provider to concentrate on managing your acute and chronic conditions during your regular appointments.  Please note that Annual Wellness Visits do not include a physical exam. Some assessments may be limited, especially if the visit was conducted virtually. If needed, we may recommend a separate in-person follow-up with your provider.  Ongoing Care Seeing your primary care provider every 3 to 6 months helps us  monitor your health and provide consistent, personalized care.   Referrals If a referral was made during today's visit and you haven't received any updates within two weeks, please contact the referred provider directly to check on the status.  Recommended Screenings:  Health Maintenance  Topic Date Due   Flu Shot  07/25/2024   COVID-19 Vaccine (6 - 2025-26 season) 08/25/2024   Breast Cancer Screening  09/17/2025   DTaP/Tdap/Td vaccine (3 - Tdap) 09/27/2025   Medicare Annual Wellness Visit  10/22/2025   Colon Cancer Screening  12/28/2027   Pneumococcal Vaccine for age over 45  Completed   DEXA scan (bone density measurement)  Completed   Hepatitis C Screening  Completed   Zoster (Shingles) Vaccine  Completed   Meningitis B Vaccine  Aged Out       10/22/2024    2:11 PM  Advanced Directives  Does Patient Have a Medical Advance Directive? No  Would patient like information on creating a medical advance directive? Yes (MAU/Ambulatory/Procedural Areas - Information given)   Advance Care Planning is important because it: Ensures you receive medical care that  aligns with your values, goals, and preferences. Provides guidance to your family and loved ones, reducing the emotional burden of decision-making during critical moments.  Information on Advanced Care Planning can be found at [place]  Secretary of [facility] Advance Health Care Directives Advance Health Care Directives ([email or web address]   Vision: Annual vision screenings are recommended for early detection of glaucoma, cataracts, and diabetic retinopathy. These exams can also reveal signs of chronic conditions such as diabetes and high blood pressure.  Dental: Annual dental screenings help detect early signs of oral cancer, gum disease, and other conditions linked to overall health, including heart disease and diabetes.  Please see the attached documents for additional preventive care recommendations.

## 2024-10-23 ENCOUNTER — Ambulatory Visit

## 2024-10-23 DIAGNOSIS — R2689 Other abnormalities of gait and mobility: Secondary | ICD-10-CM

## 2024-10-23 DIAGNOSIS — M5459 Other low back pain: Secondary | ICD-10-CM

## 2024-10-23 DIAGNOSIS — M25661 Stiffness of right knee, not elsewhere classified: Secondary | ICD-10-CM | POA: Diagnosis not present

## 2024-10-23 DIAGNOSIS — M25561 Pain in right knee: Secondary | ICD-10-CM | POA: Diagnosis not present

## 2024-10-23 DIAGNOSIS — M6281 Muscle weakness (generalized): Secondary | ICD-10-CM | POA: Diagnosis not present

## 2024-10-27 ENCOUNTER — Encounter: Payer: Self-pay | Admitting: Radiology

## 2024-10-27 NOTE — Therapy (Signed)
 OUTPATIENT PHYSICAL THERAPY THORACOLUMBAR TREATMENT  Patient Name: Tamara Hughes MRN: [id number] DOB:07-08-1951, 73 y.o., female Today's Date: 10/28/2024  END OF SESSION:  PT End of Session - 10/28/24 1514     Visit Number 4    Number of Visits 17    Date for Recertification  11/26/24    PT Start Time 1430    PT Stop Time 1510    PT Time Calculation (min) 40 min    Activity Tolerance Patient tolerated treatment well    Behavior During Therapy [facility] for tasks assessed/performed             Past Medical History:  Diagnosis Date   Allergy    Anxiety    Arthritis    Depression    Difficult intubation    pt states she was told it was hard to intubate her due to it not being straight. This was only told to her during her hysteroscopy   Dyspnea    pt states this is usually due to weather, and she may have to use her inhaler (seasonal allergies)   Family history of breast cancer    Hyperlipidemia    Osteoarthritis of right knee    Varicose vein of leg    RLE   Past Surgical History:  Procedure Laterality Date   GUM SURGERY     HYSTEROSCOPY     KNEE SURGERY Right    TOTAL KNEE ARTHROPLASTY Right 12/31/2023   Procedure: RIGHT TOTAL KNEE ARTHROPLASTY;  Surgeon: Jerri Kay HERO, MD;  Location: [facility] OR;  Service: Orthopedics;  Laterality: Right;   Patient Active Problem List   Diagnosis Date Noted   Primary osteoarthritis of right knee 12/31/2023   Status post total right knee replacement 12/31/2023   OA (osteoarthritis) of knee 12/31/2023   Pre-operative clearance 11/08/2023   Varicose veins of both legs with edema 09/16/2018   Depression, major, single episode, complete remission 09/24/2017   Routine general medical examination at a health care facility 09/24/2017   Bilateral temporomandibular joint pain 04/06/2016   Disorder of left eustachian tube 04/06/2016   Otalgia of both ears 04/06/2016   Family history of breast cancer    Allergic rhinitis 07/04/2007    PCP:  Merna Huxley, NP   REFERRING PROVIDER: Jule Ronal CROME, PA-C   REFERRING DIAG: [phone] (ICD-10-CM) - Chronic right-sided low back pain with right-sided sciatica   Rationale for Evaluation and Treatment: Rehabilitation  THERAPY DIAG:  Muscle weakness (generalized)  Other low back pain  ONSET DATE: last couple months  SUBJECTIVE:  SUBJECTIVE STATEMENT: Pt reports increased soreness after vacuuming. PERTINENT HISTORY:  No spinal surgeries.  PAIN:  Are you having pain? Yes: NPRS scale: 2/10 Pain location: lumbar R>L Pain description: achy Aggravating factors: bending, prolonged walking Relieving factors: medications, heat  PRECAUTIONS: None  RED FLAGS: None   WEIGHT BEARING RESTRICTIONS: No  FALLS:  Has patient fallen in last 6 months? No  LIVING ENVIRONMENT: Lives with: lives with their family Lives in: House/apartment Stairs: No Has following equipment at home: None  OCCUPATION: retired  PLOF: Independent  PATIENT GOALS: Decrease pain  NEXT MD VISIT: unknown  OBJECTIVE:  Note: Objective measures were completed at Evaluation unless otherwise noted.  DIAGNOSTIC FINDINGS:  Xray 08/29/24   IMPRESSION: 1. No acute abnormality of the lumbar spine. 2. Moderate degenerative disc disease at T12-L1. 3. Mild degenerative disc disease at L1-2 and L4-5. 4. 2 mm anterolisthesis at L4-5. 5. Facet arthrosis throughout the lumbar spine, relatively severe bilaterally at L3-S1.    PATIENT SURVEYS:  PSFS: THE PATIENT SPECIFIC FUNCTIONAL SCALE  Place score of 0-10 (0 = unable to perform activity and 10 = able to perform activity at the same level as before injury or problem)  Activity Date: 09/30/24    Cleaning house  5    2.prolonged walking 8    3.exercise  0    4.       Total Score 4.33      Total Score = Sum of activity scores/number of activities  Minimally Detectable Change: 3 points (for single activity); 2 points (for average score)  [facility] (nd). The Patient Specific Functional Scale . Retrieved from [email or web address]   COGNITION: Overall cognitive status: Within functional limits for tasks assessed     SENSATION: WFL  MUSCLE LENGTH: Hamstrings: Right/Left Mild restriction   POSTURE: R protracted scapula, inc T kyphosis, dec axilla space on the left, feet ER  PALPATION: R QL/pvm 1+  LUMBAR ROM:   AROM eval  Flexion 100%  Extension 75%  Right lateral flexion 75%P  Left lateral flexion 100%  Right rotation 50%P  Left rotation 50%P   (Blank rows = not tested)  LOWER EXTREMITY ROM:   WFL  Active/Passive Right eval Left eval  Hip flexion    Hip extension    Hip abduction    Hip adduction    Hip internal rotation    Hip external rotation    Knee flexion    Knee extension    Ankle dorsiflexion    Ankle plantarflexion    Ankle inversion    Ankle eversion     (Blank rows = not tested)  LOWER EXTREMITY MMT:    MMT Right eval Left eval  Hip flexion 4 4  Hip extension    Hip abduction 4 4  Hip adduction    Hip internal rotation    Hip external rotation    Knee flexion    Knee extension    Ankle dorsiflexion    Ankle plantarflexion    Ankle inversion    Ankle eversion     (Blank rows = not tested)  LUMBAR SPECIAL TESTS:  Straight leg raise test: Negative  FUNCTIONAL TESTS:  5 times sit to stand: 12 no use of UE  GAIT: Distance walked: Medical Sales Representative device utilized: None Level of assistance: Complete Independence Comments: --  TREATMENT DATE:  10/28/24 There ex for ROM/Strengthening Rec Bike level 1 6 min SAQ 2x10 bil Leg Press 56# 2x12 Neuro Re Ed for posture/stabilization Tband hip  abduction 3x10 G Bridges with Tband  3x10 Hip add with ball 10x10 sec Supine marching 2x15 Manual Percussive device to pvm R>L   10/23/24 There ex for ROM/Strengthening Rec Bike level 1 6 min SAQ 3# 2x10 bil Neuro Re Ed for posture/stabilization Tband hip abduction 3x10 G Bridges with Tband 3x10 Hip add with ball 10x10 sec Supine marching 2x12   10/20/24 There ex for ROM/Strengthening Rec Bike level 1 6 min SAQ 2.5# 2x10 bil Neuro Re Ed for posture/stabilization Tband hip abduction 3x10 G Hip add with ball 10x10 sec Supine marching 2x12   09/30/24 Initial evaluation completed followed by instruction and trial set of HEP.                                                                                                                                   PATIENT EDUCATION:  Education details: HEP Person educated: Patient Education method: Solicitor, and Actor cues Education comprehension: verbalized understanding, returned demonstration, and verbal cues required  HOME EXERCISE PROGRAM: Access Code: [email or web address] URL: [email or web address] Date: 09/30/2024 Prepared by: Burnard Meth  Exercises - Supine Hip Adduction Isometric with Ball  - 2 x daily - 7 x weekly - 1 sets - 10 reps - 10 hold - Hooklying Clamshell with Resistance  - 2 x daily - 7 x weekly - 2 sets - 10 reps - Supine Bridge  - 2 x daily - 7 x weekly - 2 sets - 10 reps - 3 hold - Supine Posterior Pelvic Tilt  - 2 x daily - 7 x weekly - 2 sets - 10 reps - 3 hold  ASSESSMENT:  CLINICAL IMPRESSION: Patient reported some right knee pain upon arriving so resistance decreased with SAQ and pt reported no pain.    OBJECTIVE IMPAIRMENTS: decreased ROM, decreased strength, impaired flexibility, and pain.   ACTIVITY LIMITATIONS: bending, standing, and squatting  PARTICIPATION LIMITATIONS: community activity and exercise  PERSONAL FACTORS: None are also affecting patient's functional outcome.   REHAB POTENTIAL:  Good  CLINICAL DECISION MAKING: Stable/uncomplicated  EVALUATION COMPLEXITY: Moderate   GOALS: Goals reviewed with patient? Yes  SHORT TERM GOALS: Target date: 10/22/2024  MET    Pt to be independent with HEP.   Baseline: Goal status: MET 10/20/24  2.  Decrease pain by 1 level. Baseline:  Goal status: MET 10/20/24  LONG TERM GOALS: Target date: 11/26/2024  8 weeks    Pt to be independent with self progressive HEP by discharge. Baseline:  Goal status: INITIAL  2.  Decrease pain to max 2/10 with all ADLs/community activities.   Baseline:  Goal status: INITIAL  3. Increase LE strength by 1/2 grade or more. Baseline:  Goal status: INITIAL  4.  Increase AROM by 25%. Baseline:  Goal status: INITIAL  5.  Normalize tenderness. Baseline: 1+ Goal status: INITIAL  PLAN:  PT FREQUENCY: 1-2x/week  PT DURATION: 8 weeks  PLANNED INTERVENTIONS: 02835- PT Re-evaluation, 97110-Therapeutic  exercises, 97530- Therapeutic activity, [id number]- Neuromuscular re-education, [phone]- Self Care, 02859- Manual therapy, [phone]- Aquatic Therapy, Patient/Family education, Balance training, Spinal mobilization, and Moist heat.  PLAN FOR NEXT SESSION: Continue increasing spinal stabilization exercises.   Burnard CHRISTELLA Meth, PT 10/28/2024, 3:20 PM

## 2024-10-28 ENCOUNTER — Ambulatory Visit

## 2024-10-28 DIAGNOSIS — M6281 Muscle weakness (generalized): Secondary | ICD-10-CM

## 2024-10-28 DIAGNOSIS — M5459 Other low back pain: Secondary | ICD-10-CM

## 2024-10-29 ENCOUNTER — Ambulatory Visit: Admitting: Adult Health

## 2024-10-29 NOTE — Therapy (Signed)
 OUTPATIENT PHYSICAL THERAPY THORACOLUMBAR TREATMENT  Patient Name: Tamara Hughes MRN: [id number] DOB:Sep 24, 1951, 73 y.o., female Today's Date: 10/30/2024  END OF SESSION:  PT End of Session - 10/30/24 1622     Visit Number 5    Number of Visits 17    Date for Recertification  11/26/24    PT Start Time 1430    PT Stop Time 1510    PT Time Calculation (min) 40 min    Activity Tolerance Patient tolerated treatment well    Behavior During Therapy [facility] for tasks assessed/performed              Past Medical History:  Diagnosis Date   Allergy    Anxiety    Arthritis    Depression    Difficult intubation    pt states she was told it was hard to intubate her due to it not being straight. This was only told to her during her hysteroscopy   Dyspnea    pt states this is usually due to weather, and she may have to use her inhaler (seasonal allergies)   Family history of breast cancer    Hyperlipidemia    Osteoarthritis of right knee    Varicose vein of leg    RLE   Past Surgical History:  Procedure Laterality Date   GUM SURGERY     HYSTEROSCOPY     KNEE SURGERY Right    TOTAL KNEE ARTHROPLASTY Right 12/31/2023   Procedure: RIGHT TOTAL KNEE ARTHROPLASTY;  Surgeon: Jerri Kay HERO, MD;  Location: [facility] OR;  Service: Orthopedics;  Laterality: Right;   Patient Active Problem List   Diagnosis Date Noted   Primary osteoarthritis of right knee 12/31/2023   Status post total right knee replacement 12/31/2023   OA (osteoarthritis) of knee 12/31/2023   Pre-operative clearance 11/08/2023   Varicose veins of both legs with edema 09/16/2018   Depression, major, single episode, complete remission 09/24/2017   Routine general medical examination at a health care facility 09/24/2017   Bilateral temporomandibular joint pain 04/06/2016   Disorder of left eustachian tube 04/06/2016   Otalgia of both ears 04/06/2016   Family history of breast cancer    Allergic rhinitis 07/04/2007    PCP:  Merna Huxley, NP   REFERRING PROVIDER: Jule Ronal CROME, PA-C   REFERRING DIAG: [phone] (ICD-10-CM) - Chronic right-sided low back pain with right-sided sciatica   Rationale for Evaluation and Treatment: Rehabilitation  THERAPY DIAG:  Muscle weakness (generalized)  Other low back pain  ONSET DATE: last couple months  SUBJECTIVE:  SUBJECTIVE STATEMENT: Pt reports she was able to dust with no increase in pain. PERTINENT HISTORY:  No spinal surgeries.  PAIN:  Are you having pain? Yes: NPRS scale: 2/10 Pain location: lumbar R>L Pain description: achy Aggravating factors: bending, prolonged walking Relieving factors: medications, heat  PRECAUTIONS: None  RED FLAGS: None   WEIGHT BEARING RESTRICTIONS: No  FALLS:  Has patient fallen in last 6 months? No  LIVING ENVIRONMENT: Lives with: lives with their family Lives in: House/apartment Stairs: No Has following equipment at home: None  OCCUPATION: retired  PLOF: Independent  PATIENT GOALS: Decrease pain  NEXT MD VISIT: unknown  OBJECTIVE:  Note: Objective measures were completed at Evaluation unless otherwise noted.  DIAGNOSTIC FINDINGS:  Xray 08/29/24   IMPRESSION: 1. No acute abnormality of the lumbar spine. 2. Moderate degenerative disc disease at T12-L1. 3. Mild degenerative disc disease at L1-2 and L4-5. 4. 2 mm anterolisthesis at L4-5. 5. Facet arthrosis throughout the lumbar spine, relatively severe bilaterally at L3-S1.    PATIENT SURVEYS:  PSFS: THE PATIENT SPECIFIC FUNCTIONAL SCALE  Place score of 0-10 (0 = unable to perform activity and 10 = able to perform activity at the same level as before injury or problem)  Activity Date: 09/30/24    Cleaning house  5    2.prolonged walking 8    3.exercise  0     4.      Total Score 4.33      Total Score = Sum of activity scores/number of activities  Minimally Detectable Change: 3 points (for single activity); 2 points (for average score)  [employer] [facility] (nd). The Patient Specific Functional Scale . Retrieved from [email or web address]   COGNITION: Overall cognitive status: Within functional limits for tasks assessed     SENSATION: WFL  MUSCLE LENGTH: Hamstrings: Right/Left Mild restriction   POSTURE: R protracted scapula, inc T kyphosis, dec axilla space on the left, feet ER  PALPATION: R QL/pvm 1+  LUMBAR ROM:   AROM eval  Flexion 100%  Extension 75%  Right lateral flexion 75%P  Left lateral flexion 100%  Right rotation 50%P  Left rotation 50%P   (Blank rows = not tested)  LOWER EXTREMITY ROM:   WFL  Active/Passive Right eval Left eval  Hip flexion    Hip extension    Hip abduction    Hip adduction    Hip internal rotation    Hip external rotation    Knee flexion    Knee extension    Ankle dorsiflexion    Ankle plantarflexion    Ankle inversion    Ankle eversion     (Blank rows = not tested)  LOWER EXTREMITY MMT:    MMT Right eval Left eval  Hip flexion 4 4  Hip extension    Hip abduction 4 4  Hip adduction    Hip internal rotation    Hip external rotation    Knee flexion    Knee extension    Ankle dorsiflexion    Ankle plantarflexion    Ankle inversion    Ankle eversion     (Blank rows = not tested)  LUMBAR SPECIAL TESTS:  Straight leg raise test: Negative  FUNCTIONAL TESTS:  5 times sit to stand: 12 no use of UE  GAIT: Distance walked: Medical Sales Representative device utilized: None Level of assistance: Complete Independence Comments: --  TREATMENT DATE:  10/30/24 There ex for ROM/Strengthening Rec Bike level 1 7 min SAQ 2x10 bil Leg Press 62# 3x10 Sit  to stand 2# ball 2x10 Neuro Re Ed for posture/stabilization Tband hip  abduction 3x10 G Bridges with Tband 3x10 G Hip add with ball 10x10 sec Supine marching 2x15 Tband rows 3x10      10/28/24 There ex for ROM/Strengthening Rec Bike level 1 6 min SAQ 2x10 bil Leg Press 56# 2x12 Neuro Re Ed for posture/stabilization Tband hip abduction 3x10 G Bridges with Tband 3x10 Hip add with ball 10x10 sec Supine marching 2x15 Manual Percussive device to pvm R>L   10/23/24 There ex for ROM/Strengthening Rec Bike level 1 6 min SAQ 3# 2x10 bil Neuro Re Ed for posture/stabilization Tband hip abduction 3x10 G Bridges with Tband 3x10 Hip add with ball 10x10 sec Supine marching 2x12   10/20/24 There ex for ROM/Strengthening Rec Bike level 1 6 min SAQ 2.5# 2x10 bil Neuro Re Ed for posture/stabilization Tband hip abduction 3x10 G Hip add with ball 10x10 sec Supine marching 2x12   09/30/24 Initial evaluation completed followed by instruction and trial set of HEP.                                                                                                                                   PATIENT EDUCATION:  Education details: HEP Person educated: Patient Education method: Solicitor, and Actor cues Education comprehension: verbalized understanding, returned demonstration, and verbal cues required  HOME EXERCISE PROGRAM: Access Code: [email or web address] URL: [email or web address] Date: 09/30/2024 Prepared by: Burnard Meth  Exercises - Supine Hip Adduction Isometric with Ball  - 2 x daily - 7 x weekly - 1 sets - 10 reps - 10 hold - Hooklying Clamshell with Resistance  - 2 x daily - 7 x weekly - 2 sets - 10 reps - Supine Bridge  - 2 x daily - 7 x weekly - 2 sets - 10 reps - 3 hold - Supine Posterior Pelvic Tilt  - 2 x daily - 7 x weekly - 2 sets - 10 reps - 3 hold  ASSESSMENT:  CLINICAL IMPRESSION: Patient needed only minimal VC for TA activation with stabilization exercises.  OBJECTIVE IMPAIRMENTS: decreased ROM,  decreased strength, impaired flexibility, and pain.   ACTIVITY LIMITATIONS: bending, standing, and squatting  PARTICIPATION LIMITATIONS: community activity and exercise  PERSONAL FACTORS: None are also affecting patient's functional outcome.   REHAB POTENTIAL: Good  CLINICAL DECISION MAKING: Stable/uncomplicated  EVALUATION COMPLEXITY: Moderate   GOALS: Goals reviewed with patient? Yes  SHORT TERM GOALS: Target date: 10/22/2024  MET    Pt to be independent with HEP.   Baseline: Goal status: MET 10/20/24  2.  Decrease pain by 1 level. Baseline:  Goal status: MET 10/20/24  LONG TERM GOALS: Target date: 11/26/2024  8 weeks    Pt to be independent with self progressive HEP by discharge. Baseline:  Goal status: INITIAL  2.  Decrease pain to max 2/10 with all ADLs/community activities.   Baseline:  Goal status: INITIAL  3.  Increase LE strength by 1/2 grade or more. Baseline:  Goal status: INITIAL  4.  Increase AROM by 25%. Baseline:  Goal status: INITIAL  5.  Normalize tenderness. Baseline: 1+ Goal status: INITIAL  PLAN:  PT FREQUENCY: 1-2x/week  PT DURATION: 8 weeks  PLANNED INTERVENTIONS: 97164- PT Re-evaluation, 97110-Therapeutic exercises, 97530- Therapeutic activity, [id number]- Neuromuscular re-education, 97535- Self Care, [id number]- Manual therapy, [phone]- Aquatic Therapy, Patient/Family education, Balance training, Spinal mobilization, and Moist heat.  PLAN FOR NEXT SESSION: Continue increasing spinal stabilization exercises.   Burnard CHRISTELLA Meth, PT 10/30/2024, 4:23 PM

## 2024-10-30 ENCOUNTER — Ambulatory Visit

## 2024-10-30 DIAGNOSIS — M6281 Muscle weakness (generalized): Secondary | ICD-10-CM | POA: Diagnosis not present

## 2024-10-30 DIAGNOSIS — M5459 Other low back pain: Secondary | ICD-10-CM | POA: Diagnosis not present

## 2024-11-03 NOTE — Therapy (Signed)
 OUTPATIENT PHYSICAL THERAPY THORACOLUMBAR TREATMENT  Patient Name: Tamara Hughes MRN: [id number] DOB:08/14/1951, 73 y.o., female Today's Date: 11/04/2024  END OF SESSION:  PT End of Session - 11/04/24 1521     Visit Number 6    Number of Visits 17    Date for Recertification  11/26/24    PT Start Time 1430    PT Stop Time 1510    PT Time Calculation (min) 40 min    Activity Tolerance Patient tolerated treatment well    Behavior During Therapy [facility] for tasks assessed/performed               Past Medical History:  Diagnosis Date   Allergy    Anxiety    Arthritis    Depression    Difficult intubation    pt states she was told it was hard to intubate her due to it not being straight. This was only told to her during her hysteroscopy   Dyspnea    pt states this is usually due to weather, and she may have to use her inhaler (seasonal allergies)   Family history of breast cancer    Hyperlipidemia    Osteoarthritis of right knee    Varicose vein of leg    RLE   Past Surgical History:  Procedure Laterality Date   GUM SURGERY     HYSTEROSCOPY     KNEE SURGERY Right    TOTAL KNEE ARTHROPLASTY Right 12/31/2023   Procedure: RIGHT TOTAL KNEE ARTHROPLASTY;  Surgeon: Jerri Kay HERO, MD;  Location: [facility] OR;  Service: Orthopedics;  Laterality: Right;   Patient Active Problem List   Diagnosis Date Noted   Primary osteoarthritis of right knee 12/31/2023   Status post total right knee replacement 12/31/2023   OA (osteoarthritis) of knee 12/31/2023   Pre-operative clearance 11/08/2023   Varicose veins of both legs with edema 09/16/2018   Depression, major, single episode, complete remission 09/24/2017   Routine general medical examination at a health care facility 09/24/2017   Bilateral temporomandibular joint pain 04/06/2016   Disorder of left eustachian tube 04/06/2016   Otalgia of both ears 04/06/2016   Family history of breast cancer    Allergic rhinitis 07/04/2007     PCP: Merna Huxley, NP   REFERRING PROVIDER: Jerri Kay HERO, MD   REFERRING DIAG: [phone] (ICD-10-CM) - Chronic right-sided low back pain with right-sided sciatica   Rationale for Evaluation and Treatment: Rehabilitation  THERAPY DIAG:  Muscle weakness (generalized)  Other low back pain  Acute pain of right knee  ONSET DATE: last couple months  SUBJECTIVE:  SUBJECTIVE STATEMENT: Pt reports she is feeling minimal back pain.  PERTINENT HISTORY:  No spinal surgeries.  PAIN:  Are you having pain? Yes: NPRS scale: 2/10 Pain location: lumbar R>L Pain description: achy Aggravating factors: bending, prolonged walking Relieving factors: medications, heat  PRECAUTIONS: None  RED FLAGS: None   WEIGHT BEARING RESTRICTIONS: No  FALLS:  Has patient fallen in last 6 months? No  LIVING ENVIRONMENT: Lives with: lives with their family Lives in: House/apartment Stairs: No Has following equipment at home: None  OCCUPATION: retired  PLOF: Independent  PATIENT GOALS: Decrease pain  NEXT MD VISIT: unknown  OBJECTIVE:  Note: Objective measures were completed at Evaluation unless otherwise noted.  DIAGNOSTIC FINDINGS:  Xray 08/29/24   IMPRESSION: 1. No acute abnormality of the lumbar spine. 2. Moderate degenerative disc disease at T12-L1. 3. Mild degenerative disc disease at L1-2 and L4-5. 4. 2 mm anterolisthesis at L4-5. 5. Facet arthrosis throughout the lumbar spine, relatively severe bilaterally at L3-S1.    PATIENT SURVEYS:  PSFS: THE PATIENT SPECIFIC FUNCTIONAL SCALE  Place score of 0-10 (0 = unable to perform activity and 10 = able to perform activity at the same level as before injury or problem)  Activity Date: 09/30/24    Cleaning house  5    2.prolonged walking  8    3.exercise  0    4.      Total Score 4.33      Total Score = Sum of activity scores/number of activities  Minimally Detectable Change: 3 points (for single activity); 2 points (for average score)  [employer] [facility] (nd). The Patient Specific Functional Scale . Retrieved from [email or web address]   COGNITION: Overall cognitive status: Within functional limits for tasks assessed     SENSATION: WFL  MUSCLE LENGTH: Hamstrings: Right/Left Mild restriction   POSTURE: R protracted scapula, inc T kyphosis, dec axilla space on the left, feet ER  PALPATION: R QL/pvm 1+  LUMBAR ROM:   AROM eval  Flexion 100%  Extension 75%  Right lateral flexion 75%P  Left lateral flexion 100%  Right rotation 50%P  Left rotation 50%P   (Blank rows = not tested)  LOWER EXTREMITY ROM:   WFL  Active/Passive Right eval Left eval  Hip flexion    Hip extension    Hip abduction    Hip adduction    Hip internal rotation    Hip external rotation    Knee flexion    Knee extension    Ankle dorsiflexion    Ankle plantarflexion    Ankle inversion    Ankle eversion     (Blank rows = not tested)  LOWER EXTREMITY MMT:    MMT Right eval Left eval  Hip flexion 4 4  Hip extension    Hip abduction 4 4  Hip adduction    Hip internal rotation    Hip external rotation    Knee flexion    Knee extension    Ankle dorsiflexion    Ankle plantarflexion    Ankle inversion    Ankle eversion     (Blank rows = not tested)  LUMBAR SPECIAL TESTS:  Straight leg raise test: Negative  FUNCTIONAL TESTS:  5 times sit to stand: 12 no use of UE  GAIT: Distance walked: Medical Sales Representative device utilized: None Level of assistance: Complete Independence Comments: --  TREATMENT DATE:  11/04/24 There ex for ROM/Strengthening Rec Bike level 1 8  min SAQ 2x10 bil Leg Press 62# 3x10 Sit to stand  2# ball 2x10 Neuro Re Ed for  posture/stabilization Tband hip abduction 3x10 G Bridges with Tband 3x10 G Hip add with ball 10x10 sec Supine marching 2x15 Tband rows 3x10 Green  10/30/24 There ex for ROM/Strengthening Rec Bike level 1 7 min SAQ 2x10 bil Leg Press 62# 3x10 Sit to stand 2# ball 2x10 Neuro Re Ed for posture/stabilization Tband hip abduction 3x10 G Bridges with Tband 3x10 G Hip add with ball 10x10 sec Supine marching 2x15 Tband rows 3x10      10/28/24 There ex for ROM/Strengthening Rec Bike level 1 6 min SAQ 2x10 bil Leg Press 56# 2x12 Neuro Re Ed for posture/stabilization Tband hip abduction 3x10 G Bridges with Tband 3x10 Hip add with ball 10x10 sec Supine marching 2x15 Manual Percussive device to pvm R>L   10/23/24 There ex for ROM/Strengthening Rec Bike level 1 6 min SAQ 3# 2x10 bil Neuro Re Ed for posture/stabilization Tband hip abduction 3x10 G Bridges with Tband 3x10 Hip add with ball 10x10 sec Supine marching 2x12   10/20/24 There ex for ROM/Strengthening Rec Bike level 1 6 min SAQ 2.5# 2x10 bil Neuro Re Ed for posture/stabilization Tband hip abduction 3x10 G Hip add with ball 10x10 sec Supine marching 2x12   09/30/24 Initial evaluation completed followed by instruction and trial set of HEP.                                                                                                                                   PATIENT EDUCATION:  Education details: HEP Person educated: Patient Education method: Solicitor, and Actor cues Education comprehension: verbalized understanding, returned demonstration, and verbal cues required  HOME EXERCISE PROGRAM: Access Code: [email or web address] URL: [email or web address] Date: 09/30/2024 Prepared by: Burnard Meth  Exercises - Supine Hip Adduction Isometric with Ball  - 2 x daily - 7 x weekly - 1 sets - 10 reps - 10 hold - Hooklying Clamshell with Resistance  - 2 x daily - 7 x weekly - 2 sets -  10 reps - Supine Bridge  - 2 x daily - 7 x weekly - 2 sets - 10 reps - 3 hold - Supine Posterior Pelvic Tilt  - 2 x daily - 7 x weekly - 2 sets - 10 reps - 3 hold  ASSESSMENT:  CLINICAL IMPRESSION: Patient with no pain increase and no need for breaks with exercise program.   OBJECTIVE IMPAIRMENTS: decreased ROM, decreased strength, impaired flexibility, and pain.   ACTIVITY LIMITATIONS: bending, standing, and squatting  PARTICIPATION LIMITATIONS: community activity and exercise  PERSONAL FACTORS: None are also affecting patient's functional outcome.   REHAB POTENTIAL: Good  CLINICAL DECISION MAKING: Stable/uncomplicated  EVALUATION COMPLEXITY: Moderate   GOALS: Goals reviewed with patient? Yes  SHORT TERM GOALS: Target date: 10/22/2024  MET    Pt to be independent with HEP.   Baseline: Goal status: MET 10/20/24  2.  Decrease pain by 1 level. Baseline:  Goal status: MET 10/20/24  LONG TERM GOALS: Target date: 11/26/2024  8 weeks    Pt to be independent with self progressive HEP by discharge. Baseline:  Goal status: INITIAL  2.  Decrease pain to max 2/10 with all ADLs/community activities.   Baseline:  Goal status: INITIAL  3. Increase LE strength by 1/2 grade or more. Baseline:  Goal status: INITIAL  4.  Increase AROM by 25%. Baseline:  Goal status: INITIAL  5.  Normalize tenderness. Baseline: 1+ Goal status: INITIAL  PLAN:  PT FREQUENCY: 1-2x/week  PT DURATION: 8 weeks  PLANNED INTERVENTIONS: 97164- PT Re-evaluation, 97110-Therapeutic exercises, 97530- Therapeutic activity, [id number]- Neuromuscular re-education, 97535- Self Care, 02859- Manual therapy, [phone]- Aquatic Therapy, Patient/Family education, Balance training, Spinal mobilization, and Moist heat.  PLAN FOR NEXT SESSION: Continue increasing spinal stabilization exercises.   Burnard CHRISTELLA Meth, PT 11/04/2024, 3:22 PM

## 2024-11-04 ENCOUNTER — Ambulatory Visit

## 2024-11-04 DIAGNOSIS — M6281 Muscle weakness (generalized): Secondary | ICD-10-CM

## 2024-11-04 DIAGNOSIS — M5459 Other low back pain: Secondary | ICD-10-CM | POA: Diagnosis not present

## 2024-11-04 DIAGNOSIS — M25561 Pain in right knee: Secondary | ICD-10-CM

## 2024-11-05 NOTE — Therapy (Signed)
 OUTPATIENT PHYSICAL THERAPY THORACOLUMBAR TREATMENT  Patient Name: Tamara Hughes MRN: [id number] DOB:08-10-1951, 73 y.o., female Today's Date: 11/06/2024  END OF SESSION:  PT End of Session - 11/06/24 1604     Visit Number 7    Number of Visits 17    Date for Recertification  11/26/24    PT Start Time 1430    PT Stop Time 1510    PT Time Calculation (min) 40 min    Activity Tolerance Patient tolerated treatment well    Behavior During Therapy [facility] for tasks assessed/performed                Past Medical History:  Diagnosis Date   Allergy    Anxiety    Arthritis    Depression    Difficult intubation    pt states she was told it was hard to intubate her due to it not being straight. This was only told to her during her hysteroscopy   Dyspnea    pt states this is usually due to weather, and she may have to use her inhaler (seasonal allergies)   Family history of breast cancer    Hyperlipidemia    Osteoarthritis of right knee    Varicose vein of leg    RLE   Past Surgical History:  Procedure Laterality Date   GUM SURGERY     HYSTEROSCOPY     KNEE SURGERY Right    TOTAL KNEE ARTHROPLASTY Right 12/31/2023   Procedure: RIGHT TOTAL KNEE ARTHROPLASTY;  Surgeon: Jerri Kay HERO, MD;  Location: [facility] OR;  Service: Orthopedics;  Laterality: Right;   Patient Active Problem List   Diagnosis Date Noted   Primary osteoarthritis of right knee 12/31/2023   Status post total right knee replacement 12/31/2023   OA (osteoarthritis) of knee 12/31/2023   Pre-operative clearance 11/08/2023   Varicose veins of both legs with edema 09/16/2018   Depression, major, single episode, complete remission 09/24/2017   Routine general medical examination at a health care facility 09/24/2017   Bilateral temporomandibular joint pain 04/06/2016   Disorder of left eustachian tube 04/06/2016   Otalgia of both ears 04/06/2016   Family history of breast cancer    Allergic rhinitis 07/04/2007     PCP: Merna Huxley, NP   REFERRING PROVIDER: Jule Ronal CROME, PA-C   REFERRING DIAG: [phone] (ICD-10-CM) - Chronic right-sided low back pain with right-sided sciatica   Rationale for Evaluation and Treatment: Rehabilitation  THERAPY DIAG:  Muscle weakness (generalized)  Other low back pain  Acute pain of right knee  ONSET DATE: last couple months  SUBJECTIVE:  SUBJECTIVE STATEMENT: Pt reports she is feeling minimal to no back pain.  PERTINENT HISTORY:  No spinal surgeries.  PAIN:  Are you having pain? Yes: NPRS scale: 2/10 Pain location: lumbar R>L Pain description: achy Aggravating factors: bending, prolonged walking Relieving factors: medications, heat  PRECAUTIONS: None  RED FLAGS: None   WEIGHT BEARING RESTRICTIONS: No  FALLS:  Has patient fallen in last 6 months? No  LIVING ENVIRONMENT: Lives with: lives with their family Lives in: House/apartment Stairs: No Has following equipment at home: None  OCCUPATION: retired  PLOF: Independent  PATIENT GOALS: Decrease pain  NEXT MD VISIT: unknown  OBJECTIVE:  Note: Objective measures were completed at Evaluation unless otherwise noted.  DIAGNOSTIC FINDINGS:  Xray 08/29/24   IMPRESSION: 1. No acute abnormality of the lumbar spine. 2. Moderate degenerative disc disease at T12-L1. 3. Mild degenerative disc disease at L1-2 and L4-5. 4. 2 mm anterolisthesis at L4-5. 5. Facet arthrosis throughout the lumbar spine, relatively severe bilaterally at L3-S1.    PATIENT SURVEYS:  PSFS: THE PATIENT SPECIFIC FUNCTIONAL SCALE  Place score of 0-10 (0 = unable to perform activity and 10 = able to perform activity at the same level as before injury or problem)  Activity Date: 09/30/24    Cleaning house  5     2.prolonged walking 8    3.exercise  0    4.      Total Score 4.33      Total Score = Sum of activity scores/number of activities  Minimally Detectable Change: 3 points (for single activity); 2 points (for average score)  [facility] (nd). The Patient Specific Functional Scale . Retrieved from [email or web address]   COGNITION: Overall cognitive status: Within functional limits for tasks assessed     SENSATION: WFL  MUSCLE LENGTH: Hamstrings: Right/Left Mild restriction   POSTURE: R protracted scapula, inc T kyphosis, dec axilla space on the left, feet ER  PALPATION: R QL/pvm 1+  LUMBAR ROM:   AROM eval  Flexion 100%  Extension 75%  Right lateral flexion 75%P  Left lateral flexion 100%  Right rotation 50%P  Left rotation 50%P   (Blank rows = not tested)  LOWER EXTREMITY ROM:   WFL  Active/Passive Right eval Left eval  Hip flexion    Hip extension    Hip abduction    Hip adduction    Hip internal rotation    Hip external rotation    Knee flexion    Knee extension    Ankle dorsiflexion    Ankle plantarflexion    Ankle inversion    Ankle eversion     (Blank rows = not tested)  LOWER EXTREMITY MMT:    MMT Right eval Left eval  Hip flexion 4 4  Hip extension    Hip abduction 4 4  Hip adduction    Hip internal rotation    Hip external rotation    Knee flexion    Knee extension    Ankle dorsiflexion    Ankle plantarflexion    Ankle inversion    Ankle eversion     (Blank rows = not tested)  LUMBAR SPECIAL TESTS:  Straight leg raise test: Negative  FUNCTIONAL TESTS:  5 times sit to stand: 12 no use of UE  GAIT: Distance walked: Medical Sales Representative device utilized: None Level of assistance: Complete Independence Comments: --  TREATMENT DATE:  11/06/24 There ex for ROM/Strengthening Rec Bike level 1 8  min SAQ 2x10 bil Leg Press 75# 3x10 Sit  to stand 2# ball 10x  5#KB  10x Neuro Re Ed for posture/stabilization Tband hip abduction 3x10 G Bridges with TA activation and marching 3x10  Hip add with ball 10x10 sec Supine marching 2x15 Tband rows 3x10 Green   11/04/24 There ex for ROM/Strengthening Rec Bike level 1 8  min SAQ 2x10 bil Leg Press 62# 3x10 Sit to stand 2# ball 2x10 Neuro Re Ed for posture/stabilization Tband hip abduction 3x10 G Bridges with Tband 3x10 G Hip add with ball 10x10 sec Supine marching 2x15 Tband rows 3x10 Green  10/30/24 There ex for ROM/Strengthening Rec Bike level 1 7 min SAQ 2x10 bil Leg Press 62# 3x10 Sit to stand 2# ball 2x10 Neuro Re Ed for posture/stabilization Tband hip abduction 3x10 G Bridges with Tband 3x10 G Hip add with ball 10x10 sec Supine marching 2x15 Tband rows 3x10      10/28/24 There ex for ROM/Strengthening Rec Bike level 1 6 min SAQ 2x10 bil Leg Press 56# 2x12 Neuro Re Ed for posture/stabilization Tband hip abduction 3x10 G Bridges with Tband 3x10 Hip add with ball 10x10 sec Supine marching 2x15 Manual Percussive device to pvm R>L   10/23/24 There ex for ROM/Strengthening Rec Bike level 1 6 min SAQ 3# 2x10 bil Neuro Re Ed for posture/stabilization Tband hip abduction 3x10 G Bridges with Tband 3x10 Hip add with ball 10x10 sec Supine marching 2x12   10/20/24 There ex for ROM/Strengthening Rec Bike level 1 6 min SAQ 2.5# 2x10 bil Neuro Re Ed for posture/stabilization Tband hip abduction 3x10 G Hip add with ball 10x10 sec Supine marching 2x12   09/30/24 Initial evaluation completed followed by instruction and trial set of HEP.                                                                                                                                   PATIENT EDUCATION:  Education details: HEP Person educated: Patient Education method: Solicitor, and Actor cues Education comprehension: verbalized understanding, returned demonstration, and  verbal cues required  HOME EXERCISE PROGRAM: Access Code: [email or web address] URL: [email or web address] Date: 09/30/2024 Prepared by: Burnard Meth  Exercises - Supine Hip Adduction Isometric with Ball  - 2 x daily - 7 x weekly - 1 sets - 10 reps - 10 hold - Hooklying Clamshell with Resistance  - 2 x daily - 7 x weekly - 2 sets - 10 reps - Supine Bridge  - 2 x daily - 7 x weekly - 2 sets - 10 reps - 3 hold - Supine Posterior Pelvic Tilt  - 2 x daily - 7 x weekly - 2 sets - 10 reps - 3 hold  ASSESSMENT:  CLINICAL IMPRESSION: Patient needed VC for TA activation with bridges and marching.  Demonstrated understanding. OBJECTIVE IMPAIRMENTS: decreased ROM, decreased strength, impaired flexibility, and pain.   ACTIVITY LIMITATIONS: bending, standing, and squatting  PARTICIPATION LIMITATIONS: community activity and exercise  PERSONAL FACTORS: None are  also affecting patient's functional outcome.   REHAB POTENTIAL: Good  CLINICAL DECISION MAKING: Stable/uncomplicated  EVALUATION COMPLEXITY: Moderate   GOALS: Goals reviewed with patient? Yes  SHORT TERM GOALS: Target date: 10/22/2024  MET    Pt to be independent with HEP.   Baseline: Goal status: MET 10/20/24  2.  Decrease pain by 1 level. Baseline:  Goal status: MET 10/20/24  LONG TERM GOALS: Target date: 11/26/2024  8 weeks    Pt to be independent with self progressive HEP by discharge. Baseline:  Goal status: INITIAL  2.  Decrease pain to max 2/10 with all ADLs/community activities.   Baseline:  Goal status: INITIAL  3. Increase LE strength by 1/2 grade or more. Baseline:  Goal status: INITIAL  4.  Increase AROM by 25%. Baseline:  Goal status: INITIAL  5.  Normalize tenderness. Baseline: 1+ Goal status: INITIAL  PLAN:  PT FREQUENCY: 1-2x/week  PT DURATION: 8 weeks  PLANNED INTERVENTIONS: 97164- PT Re-evaluation, 97110-Therapeutic exercises, 97530- Therapeutic activity, [id number]- Neuromuscular  re-education, 97535- Self Care, [id number]- Manual therapy, [id number]- Aquatic Therapy, Patient/Family education, Balance training, Spinal mobilization, and Moist heat.  PLAN FOR NEXT SESSION: Plan is to move to 1x week for 3 weeks then discharge.     Burnard CHRISTELLA Meth, PT 11/06/2024, 4:05 PM

## 2024-11-06 ENCOUNTER — Ambulatory Visit

## 2024-11-06 DIAGNOSIS — M5459 Other low back pain: Secondary | ICD-10-CM | POA: Diagnosis not present

## 2024-11-06 DIAGNOSIS — M6281 Muscle weakness (generalized): Secondary | ICD-10-CM | POA: Diagnosis not present

## 2024-11-06 DIAGNOSIS — M25561 Pain in right knee: Secondary | ICD-10-CM | POA: Diagnosis not present

## 2024-11-12 ENCOUNTER — Ambulatory Visit

## 2024-11-12 DIAGNOSIS — M5459 Other low back pain: Secondary | ICD-10-CM

## 2024-11-12 DIAGNOSIS — M6281 Muscle weakness (generalized): Secondary | ICD-10-CM | POA: Diagnosis not present

## 2024-11-12 NOTE — Therapy (Addendum)
 " OUTPATIENT PHYSICAL THERAPY THORACOLUMBAR TREATMENT/DC  Patient Name: Tamara Hughes MRN: [id number] DOB:Aug 28, 1951, 73 y.o., female Today's Date: 11/12/2024  END OF SESSION:  PT End of Session - 11/12/24 1146     Visit Number 8    Number of Visits 17    Date for Recertification  11/26/24    PT Start Time 1105    PT Stop Time 1145    PT Time Calculation (min) 40 min    Activity Tolerance Patient tolerated treatment well    Behavior During Therapy WFL for tasks assessed/performed                 Past Medical History:  Diagnosis Date   Allergy    Anxiety    Arthritis    Depression    Difficult intubation    pt states she was told it was hard to intubate her due to it not being straight. This was only told to her during her hysteroscopy   Dyspnea    pt states this is usually due to weather, and she may have to use her inhaler (seasonal allergies)   Family history of breast cancer    Hyperlipidemia    Osteoarthritis of right knee    Varicose vein of leg    RLE   Past Surgical History:  Procedure Laterality Date   GUM SURGERY     HYSTEROSCOPY     KNEE SURGERY Right    TOTAL KNEE ARTHROPLASTY Right 12/31/2023   Procedure: RIGHT TOTAL KNEE ARTHROPLASTY;  Surgeon: Jerri Kay HERO, MD;  Location: [facility] OR;  Service: Orthopedics;  Laterality: Right;   Patient Active Problem List   Diagnosis Date Noted   Primary osteoarthritis of right knee 12/31/2023   Status post total right knee replacement 12/31/2023   OA (osteoarthritis) of knee 12/31/2023   Pre-operative clearance 11/08/2023   Varicose veins of both legs with edema 09/16/2018   Depression, major, single episode, complete remission 09/24/2017   Routine general medical examination at a health care facility 09/24/2017   Bilateral temporomandibular joint pain 04/06/2016   Disorder of left eustachian tube 04/06/2016   Otalgia of both ears 04/06/2016   Family history of breast cancer    Allergic rhinitis 07/04/2007     PCP: Merna Huxley, NP   REFERRING PROVIDER: Jerri Kay HERO, MD   REFERRING DIAG: [phone] (ICD-10-CM) - Chronic right-sided low back pain with right-sided sciatica   Rationale for Evaluation and Treatment: Rehabilitation  THERAPY DIAG:  Muscle weakness (generalized)  Other low back pain  ONSET DATE: last couple months  SUBJECTIVE:  SUBJECTIVE STATEMENT: Pt reports increased walking with no pain increase.  PERTINENT HISTORY:  No spinal surgeries.  PAIN:  Are you having pain? Yes: NPRS scale: 1/10 Pain location: lumbar R>L Pain description: achy Aggravating factors: bending, prolonged walking Relieving factors: medications, heat  PRECAUTIONS: None  RED FLAGS: None   WEIGHT BEARING RESTRICTIONS: No  FALLS:  Has patient fallen in last 6 months? No  LIVING ENVIRONMENT: Lives with: lives with their family Lives in: House/apartment Stairs: No Has following equipment at home: None  OCCUPATION: retired  PLOF: Independent  PATIENT GOALS: Decrease pain  NEXT MD VISIT: unknown  OBJECTIVE:  Note: Objective measures were completed at Evaluation unless otherwise noted.  DIAGNOSTIC FINDINGS:  Xray 08/29/24   IMPRESSION: 1. No acute abnormality of the lumbar spine. 2. Moderate degenerative disc disease at T12-L1. 3. Mild degenerative disc disease at L1-2 and L4-5. 4. 2 mm anterolisthesis at L4-5. 5. Facet arthrosis throughout the lumbar spine, relatively severe bilaterally at L3-S1.    PATIENT SURVEYS:  PSFS: THE PATIENT SPECIFIC FUNCTIONAL SCALE  Place score of 0-10 (0 = unable to perform activity and 10 = able to perform activity at the same level as before injury or problem)  Activity Date: 09/30/24 Date: 11/12/24   Cleaning house  5 5   2.prolonged walking 8 8    3.exercise  0 8   4.      Total Score 4.33 7     Total Score = Sum of activity scores/number of activities  Minimally Detectable Change: 3 points (for single activity); 2 points (for average score)  [employer] (nd). The Patient Specific Functional Scale . Retrieved from [email or web address]   COGNITION: Overall cognitive status: Within functional limits for tasks assessed     SENSATION: WFL  MUSCLE LENGTH: Hamstrings: Right/Left Mild restriction   POSTURE: R protracted scapula, inc T kyphosis, dec axilla space on the left, feet ER  PALPATION: R QL/pvm 1+  LUMBAR ROM:   AROM eval 11/12/24  Flexion 100% 100%  Extension 75% 100%  Right lateral flexion 75%P 75% no pain  Left lateral flexion 100% 100%  Right rotation 50%P 75%  Left rotation 50%P 75%   (Blank rows = not tested)  LOWER EXTREMITY ROM:   WFL  Active/Passive Right eval Left eval  Hip flexion    Hip extension    Hip abduction    Hip adduction    Hip internal rotation    Hip external rotation    Knee flexion    Knee extension    Ankle dorsiflexion    Ankle plantarflexion    Ankle inversion    Ankle eversion     (Blank rows = not tested)  LOWER EXTREMITY MMT:    MMT Right eval Left eval 11/12/24  Hip flexion 4 4 4+  Hip extension     Hip abduction 4 4 4+  Hip adduction     Hip internal rotation     Hip external rotation     Knee flexion     Knee extension     Ankle dorsiflexion     Ankle plantarflexion     Ankle inversion     Ankle eversion      (Blank rows = not tested)  LUMBAR SPECIAL TESTS:  Straight leg raise test: Negative  FUNCTIONAL TESTS:  5 times sit to stand: 12 no use of UE  GAIT: Distance walked: Orthoptist utilized: None Level of assistance: Complete Independence Comments: --  TREATMENT DATE:  11/12/24 There ex for ROM/Strengthening Nustep 8 min level 5  SAQ #1 2x10 bil Leg  Press 81# 3x10 Sit to stand 2# ball 10x  5#KB 10x Neuro Re Ed for posture/stabilization Tband hip abduction 3x10 G Bridges with TA activation and marching 3x10  Hip add with ball 10x10 sec Supine marching 2x15 Tband rows 3x10 Green     11/06/24 There ex for ROM/Strengthening Rec Bike level 1 8  min SAQ 2x10 bil Leg Press 75# 3x10 Sit to stand 2# ball 10x  5#KB 10x Neuro Re Ed for posture/stabilization Tband hip abduction 3x10 G Bridges with TA activation and marching 3x10  Hip add with ball 10x10 sec Supine marching 2x15 Tband rows 3x10 Green   11/04/24 There ex for ROM/Strengthening Rec Bike level 1 8  min SAQ 2x10 bil Leg Press 62# 3x10 Sit to stand 2# ball 2x10 Neuro Re Ed for posture/stabilization Tband hip abduction 3x10 G Bridges with Tband 3x10 G Hip add with ball 10x10 sec Supine marching 2x15 Tband rows 3x10 Green  10/30/24 There ex for ROM/Strengthening Rec Bike level 1 7 min SAQ 2x10 bil Leg Press 62# 3x10 Sit to stand 2# ball 2x10 Neuro Re Ed for posture/stabilization Tband hip abduction 3x10 G Bridges with Tband 3x10 G Hip add with ball 10x10 sec Supine marching 2x15 Tband rows 3x10      10/28/24 There ex for ROM/Strengthening Rec Bike level 1 6 min SAQ 2x10 bil Leg Press 56# 2x12 Neuro Re Ed for posture/stabilization Tband hip abduction 3x10 G Bridges with Tband 3x10 Hip add with ball 10x10 sec Supine marching 2x15 Manual Percussive device to pvm R>L   10/23/24 There ex for ROM/Strengthening Rec Bike level 1 6 min SAQ 3# 2x10 bil Neuro Re Ed for posture/stabilization Tband hip abduction 3x10 G Bridges with Tband 3x10 Hip add with ball 10x10 sec Supine marching 2x12   10/20/24 There ex for ROM/Strengthening Rec Bike level 1 6 min SAQ 2.5# 2x10 bil Neuro Re Ed for posture/stabilization Tband hip abduction 3x10 G Hip add with ball 10x10 sec Supine marching 2x12   09/30/24 Initial evaluation completed followed by  instruction and trial set of HEP.                                                                                                                                   PATIENT EDUCATION:  Education details: HEP Person educated: Patient Education method: Solicitor, and Actor cues Education comprehension: verbalized understanding, returned demonstration, and verbal cues required  HOME EXERCISE PROGRAM: Access Code: [email or web address] URL: [email or web address] Date: 09/30/2024 Prepared by: Burnard Meth  Exercises - Supine Hip Adduction Isometric with Ball  - 2 x daily - 7 x weekly - 1 sets - 10 reps - 10 hold - Hooklying Clamshell with Resistance  - 2 x daily - 7 x weekly - 2 sets - 10 reps - Supine Bridge  - 2 x daily -  7 x weekly - 2 sets - 10 reps - 3 hold - Supine Posterior Pelvic Tilt  - 2 x daily - 7 x weekly - 2 sets - 10 reps - 3 hold  ASSESSMENT:  CLINICAL IMPRESSION: AROM increased.  PSFS score increased.  Progressing toward final LTG. OBJECTIVE IMPAIRMENTS: decreased ROM, decreased strength, impaired flexibility, and pain.   ACTIVITY LIMITATIONS: bending, standing, and squatting  PARTICIPATION LIMITATIONS: community activity and exercise  PERSONAL FACTORS: None are also affecting patient's functional outcome.   REHAB POTENTIAL: Good  CLINICAL DECISION MAKING: Stable/uncomplicated  EVALUATION COMPLEXITY: Moderate   GOALS: Goals reviewed with patient? Yes  SHORT TERM GOALS: Target date: 10/22/2024  MET    Pt to be independent with HEP.   Baseline: Goal status: MET 10/20/24  2.  Decrease pain by 1 level. Baseline:  Goal status: MET 10/20/24  LONG TERM GOALS: Target date: 11/26/2024  8 weeks    Pt to be independent with self progressive HEP by discharge. Baseline:  Goal status: MET 11/12/24  2.  Decrease pain to max 2/10 with all ADLs/community activities.   Baseline:  Goal status: MET 11/12/24  3. Increase LE strength by 1/2  grade or more. Baseline:  Goal status: INITIAL  4.  Increase AROM by 25%. Baseline:  Goal status: MET 11/12/24  5.  Normalize tenderness. Baseline: 1+ Goal status: INITIAL  PLAN:  PT FREQUENCY: 1-2x/week  PT DURATION: 8 weeks  PLANNED INTERVENTIONS: 97164- PT Re-evaluation, 97110-Therapeutic exercises, 97530- Therapeutic activity, [id number]- Neuromuscular re-education, 97535- Self Care, 02859- Manual therapy, [phone]- Aquatic Therapy, Patient/Family education, Balance training, Spinal mobilization, and Moist heat.  PLAN FOR NEXT SESSION: Plan is to move to 1x week for 2 weeks then discharge.     Burnard CHRISTELLA Meth, PT 11/12/2024, 11:47 AM   PHYSICAL THERAPY DISCHARGE SUMMARY  Visits from Start of Care: 8  Current functional level related to goals / functional outcomes: See above    Remaining deficits: See above   Education / Equipment: HEP   Patient agrees to discharge. Patient goals were . Patient is being discharged due to partially metnot returning since the last visit.  "

## 2024-11-19 NOTE — Progress Notes (Signed)
   11/19/2024  Patient ID: Tamara Hughes, female   DOB: May 17, 1951, 73 y.o.   MRN: [id number]  Pharmacy Quality Measure Review  This patient is appearing on a report for being at risk of failing the adherence measure for cholesterol (statin) medications this calendar year.   Medication: Simvastatin  Last fill date: 10/21/24 for 90 day supply  Insurance report was not up to date. No action needed at this time.   Jon VEAR Lind[place], PharmD Clinical Pharmacist [phone]

## 2024-12-05 ENCOUNTER — Ambulatory Visit: Admitting: Adult Health

## 2024-12-05 ENCOUNTER — Encounter: Payer: Self-pay | Admitting: Adult Health

## 2024-12-05 VITALS — BP 138/82 | HR 67 | Temp 98.1°F | Ht 63.0 in | Wt 169.0 lb

## 2024-12-05 DIAGNOSIS — E559 Vitamin D deficiency, unspecified: Secondary | ICD-10-CM

## 2024-12-05 DIAGNOSIS — F419 Anxiety disorder, unspecified: Secondary | ICD-10-CM

## 2024-12-05 DIAGNOSIS — Z23 Encounter for immunization: Secondary | ICD-10-CM

## 2024-12-05 DIAGNOSIS — Z0000 Encounter for general adult medical examination without abnormal findings: Secondary | ICD-10-CM

## 2024-12-05 DIAGNOSIS — E785 Hyperlipidemia, unspecified: Secondary | ICD-10-CM

## 2024-12-05 LAB — CBC WITH DIFFERENTIAL/PLATELET
Basophils Absolute: 0.1 K/uL (ref 0.0–0.1)
Basophils Relative: 0.9 % (ref 0.0–3.0)
Eosinophils Absolute: 0.2 K/uL (ref 0.0–0.7)
Eosinophils Relative: 3.1 % (ref 0.0–5.0)
HCT: 40.1 % (ref 36.0–46.0)
Hemoglobin: 13.1 g/dL (ref 12.0–15.0)
Lymphocytes Relative: 23.6 % (ref 12.0–46.0)
Lymphs Abs: 1.5 K/uL (ref 0.7–4.0)
MCHC: 32.7 g/dL (ref 30.0–36.0)
MCV: 81.2 fl (ref 78.0–100.0)
Monocytes Absolute: 0.7 K/uL (ref 0.1–1.0)
Monocytes Relative: 11.7 % (ref 3.0–12.0)
Neutro Abs: 3.8 K/uL (ref 1.4–7.7)
Neutrophils Relative %: 60.7 % (ref 43.0–77.0)
Platelets: 271 K/uL (ref 150.0–400.0)
RBC: 4.94 Mil/uL (ref 3.87–5.11)
RDW: 14.9 % (ref 11.5–15.5)
WBC: 6.3 K/uL (ref 4.0–10.5)

## 2024-12-05 LAB — COMPREHENSIVE METABOLIC PANEL WITH GFR
ALT: 17 U/L (ref 0–35)
AST: 22 U/L (ref 0–37)
Albumin: 4.3 g/dL (ref 3.5–5.2)
Alkaline Phosphatase: 63 U/L (ref 39–117)
BUN: 10 mg/dL (ref 6–23)
CO2: 31 meq/L (ref 19–32)
Calcium: 9.9 mg/dL (ref 8.4–10.5)
Chloride: 103 meq/L (ref 96–112)
Creatinine, Ser: 0.67 mg/dL (ref 0.40–1.20)
GFR: 85.62 mL/min (ref >60.00)
Glucose, Bld: 80 mg/dL (ref 70–99)
Potassium: 4.1 meq/L (ref 3.5–5.1)
Sodium: 143 meq/L (ref 135–145)
Total Bilirubin: 0.6 mg/dL (ref 0.2–1.2)
Total Protein: 7 g/dL (ref 6.0–8.3)

## 2024-12-05 LAB — LIPID PANEL
Cholesterol: 161 mg/dL (ref 0–200)
HDL: 53.5 mg/dL (ref >39.00)
LDL Cholesterol: 85 mg/dL (ref 0–99)
NonHDL: 107.39
Total CHOL/HDL Ratio: 3
Triglycerides: 112 mg/dL (ref 0.0–149.0)
VLDL: 22.4 mg/dL (ref 0.0–40.0)

## 2024-12-05 LAB — TSH: TSH: 1.18 u[IU]/mL (ref 0.35–5.50)

## 2024-12-05 MED ORDER — CITALOPRAM HYDROBROMIDE 40 MG PO TABS: ORAL_TABLET | ORAL | 3 refills | Status: AC

## 2024-12-05 MED ORDER — SIMVASTATIN 40 MG PO TABS: ORAL_TABLET | ORAL | 3 refills | Status: AC

## 2024-12-05 MED ORDER — METHOCARBAMOL 750 MG PO TABS: 750.0000 mg | ORAL_TABLET | Freq: Two times a day (BID) | ORAL | 2 refills | Status: AC | PRN

## 2024-12-05 NOTE — Progress Notes (Signed)
 Subjective:    Patient ID: Tamara Hughes, female    DOB: 1951-05-24, 73 y.o.   MRN: [id number]  HPI Patient presents for yearly preventative medicine examination. She is a pleasant 73 year old female who  has a past medical history of Allergy, Anxiety, Arthritis, Depression, Difficult intubation, Dyspnea, Family history of breast cancer, Hyperlipidemia, Osteoarthritis of right knee, and Varicose vein of leg.  Anxiety/depression-currently managed with Celexa  40 mg nightly and Ativan  0.5 mg twice daily as needed( does not use often).She does feel well controlled on this regimen  Hyperlipidemia-takes Zocor  40 mg nightly.  She denies myalgia or fatigue Lab Results  Component Value Date   CHOL 178 11/08/2023   HDL 50.40 11/08/2023   LDLCALC 98 11/08/2023   TRIG 147.0 11/08/2023   CHOLHDL 4 11/08/2023   Vitamin D  deficiency - takes vitamin D   and calcium.  Last vitamin D  Lab Results  Component Value Date   VD25OH 33.23 08/28/2024    All immunizations and health maintenance protocols were reviewed with the patient and needed orders were placed.  Appropriate screening laboratory values were ordered for the patient including screening of hyperlipidemia, renal function and hepatic function. If indicated by BPH, a PSA was ordered.  Medication reconciliation,  past medical history, social history, problem list and allergies were reviewed in detail with the patient  Goals were established with regard to weight loss, exercise, and  diet in compliance with medications Wt Readings from Last 3 Encounters:  12/05/24 169 lb (76.7 kg)  10/22/24 169 lb (76.7 kg)  08/28/24 169 lb (76.7 kg)   She is up to date on routine colon cancer screening, mammograms and GYN care.  She has not been exercising much due to low back pain that she is in PT for. She is not eating a healthy diet.   Review of Systems  Constitutional: Negative.   HENT: Negative.    Eyes: Negative.   Respiratory: Negative.     Cardiovascular: Negative.   Gastrointestinal: Negative.   Endocrine: Negative.   Genitourinary: Negative.   Musculoskeletal:  Positive for arthralgias and back pain.  Skin: Negative.   Allergic/Immunologic: Negative.   Neurological: Negative.   Hematological: Negative.   Psychiatric/Behavioral: Negative.       Past Medical History:  Diagnosis Date   Allergy    Anxiety    Arthritis    Depression    Difficult intubation    pt states she was told it was hard to intubate her due to it not being straight. This was only told to her during her hysteroscopy   Dyspnea    pt states this is usually due to weather, and she may have to use her inhaler (seasonal allergies)   Family history of breast cancer    Hyperlipidemia    Osteoarthritis of right knee    Varicose vein of leg    RLE    Social History   Socioeconomic History   Marital status: Single    Spouse name: Not on file   Number of children: 1   Years of education: Not on file   Highest education level: Bachelor's degree (e.g., BA, AB, BS)  Occupational History   Not on file  Tobacco Use   Smoking status: Former    Current packs/day: 0.00    Average packs/day: 1 pack/day for 15.0 years (15.0 ttl pk-yrs)    Types: Cigarettes    Start date: 04/20/1968    Quit date: 04/21/1983    Years since  quitting: 41.6   Smokeless tobacco: Never  Vaping Use   Vaping status: Never Used  Substance and Sexual Activity   Alcohol use: Yes    Alcohol/week: 1.0 standard drink of alcohol    Types: 1 Cans of beer per week    Comment: maybe one beer every couple of weeks   Drug use: No   Sexual activity: Not on file  Other Topics Concern   Not on file  Social History Narrative   Not on file   Social Drivers of Health   Tobacco Use: Medium Risk (12/05/2024)   Patient History    Smoking Tobacco Use: Former    Smokeless Tobacco Use: Never    Passive Exposure: Not on file  Financial Resource Strain: Low Risk (10/22/2024)    Overall Financial Resource Strain (CARDIA)    Difficulty of Paying Living Expenses: Not hard at all  Food Insecurity: No Food Insecurity (10/22/2024)   Epic    Worried About Radiation Protection Practitioner of Food in the Last Year: Never true    Ran Out of Food in the Last Year: Never true  Transportation Needs: No Transportation Needs (10/22/2024)   Epic    Lack of Transportation (Medical): No    Lack of Transportation (Non-Medical): No  Physical Activity: Inactive (10/22/2024)   Exercise Vital Sign    Days of Exercise per Week: 0 days    Minutes of Exercise per Session: 0 min  Stress: No Stress Concern Present (10/22/2024)   [employer] of Occupational Health - Occupational Stress Questionnaire    Feeling of Stress: Only a little  Social Connections: Moderately Integrated (10/22/2024)   Social Connection and Isolation Panel    Frequency of Communication with Friends and Family: Three times a week    Frequency of Social Gatherings with Friends and Family: Once a week    Attends Religious Services: More than 4 times per year    Active Member of Clubs or Organizations: Yes    Attends Banker Meetings: More than 4 times per year    Marital Status: Never married  Intimate Partner Violence: Not At Risk (10/22/2024)   Epic    Fear of Current or Ex-Partner: No    Emotionally Abused: No    Physically Abused: No    Sexually Abused: No  Depression (PHQ2-9): Medium Risk (12/05/2024)   Depression (PHQ2-9)    PHQ-2 Score: 6  Alcohol Screen: Low Risk (10/22/2024)   Alcohol Screen    Last Alcohol Screening Score (AUDIT): 0  Housing: Unknown (10/22/2024)   Epic    Unable to Pay for Housing in the Last Year: No    Number of Times Moved in the Last Year: Not on file    Homeless in the Last Year: No  Utilities: Not At Risk (10/22/2024)   Epic    Threatened with loss of utilities: No  Health Literacy: Adequate Health Literacy (10/22/2024)   B1300 Health Literacy    Frequency of need for  help with medical instructions: Never    Past Surgical History:  Procedure Laterality Date   GUM SURGERY     HYSTEROSCOPY     KNEE SURGERY Right    TOTAL KNEE ARTHROPLASTY Right 12/31/2023   Procedure: RIGHT TOTAL KNEE ARTHROPLASTY;  Surgeon: Jerri Kay HERO, MD;  Location: [facility] OR;  Service: Orthopedics;  Laterality: Right;    Family History  Problem Relation Age of Onset   Breast cancer Mother 50   Bone cancer Father 71   Breast cancer  Sister 22   Heart attack Sister    Diabetes Sister    Breast cancer Maternal Aunt        dx >50   Cancer Maternal Aunt        NOS   Breast cancer Other        MGF's sister #1   Hyperlipidemia Other    Hypertension Other    Thyroid  disease Other    Stomach cancer Neg Hx    Rectal cancer Neg Hx    Pancreatic cancer Neg Hx    Colon cancer Neg Hx     Allergies[1]  Medications Ordered Prior to Encounter[2]  BP 138/82   Pulse 67   Temp 98.1 F (36.7 C) (Oral)   Ht 5' 3 (1.6 m)   Wt 169 lb (76.7 kg)   SpO2 98%   BMI 29.94 kg/m       Objective:   Physical Exam Vitals and nursing note reviewed.  Constitutional:      General: She is not in acute distress.    Appearance: Normal appearance. She is not ill-appearing.  HENT:     Head: Normocephalic and atraumatic.     Right Ear: Tympanic membrane, ear canal and external ear normal. There is no impacted cerumen.     Left Ear: Tympanic membrane, ear canal and external ear normal. There is no impacted cerumen.     Nose: Nose normal. No congestion or rhinorrhea.     Mouth/Throat:     Mouth: Mucous membranes are moist.     Pharynx: Oropharynx is clear.  Eyes:     Extraocular Movements: Extraocular movements intact.     Conjunctiva/sclera: Conjunctivae normal.     Pupils: Pupils are equal, round, and reactive to light.  Neck:     Vascular: No carotid bruit.  Cardiovascular:     Rate and Rhythm: Normal rate and regular rhythm.     Pulses: Normal pulses.     Heart sounds: No murmur  heard.    No friction rub. No gallop.  Pulmonary:     Effort: Pulmonary effort is normal.     Breath sounds: Normal breath sounds.  Abdominal:     General: Abdomen is flat. Bowel sounds are normal. There is no distension.     Palpations: Abdomen is soft. There is no mass.     Tenderness: There is no abdominal tenderness. There is no guarding or rebound.     Hernia: No hernia is present.  Musculoskeletal:        General: Normal range of motion.     Cervical back: Normal range of motion and neck supple.  Lymphadenopathy:     Cervical: No cervical adenopathy.  Skin:    General: Skin is warm and dry.     Capillary Refill: Capillary refill takes less than 2 seconds.  Neurological:     General: No focal deficit present.     Mental Status: She is alert and oriented to person, place, and time.  Psychiatric:        Mood and Affect: Mood normal.        Behavior: Behavior normal.        Thought Content: Thought content normal.        Judgment: Judgment normal.       Assessment & Plan:  1. Routine general medical examination at a health care facility (Primary) Today patient counseled on age appropriate routine health concerns for screening and prevention, each reviewed and up to date or declined.  Immunizations reviewed and up to date or declined. Labs ordered and reviewed. Risk factors for depression reviewed and negative. Hearing function and visual acuity are intact. ADLs screened and addressed as needed. Functional ability and level of safety reviewed and appropriate. Education, counseling and referrals performed based on assessed risks today. Patient provided with a copy of personalized plan for preventive services. - encouraged lifestyle modifications  - eat healthy and exercise  - Follow up in on year or sooner if needed  2. Anxiety and depression - Controlled. No change in medication  - CBC with Differential/Platelet; Future - Comprehensive metabolic panel with GFR; Future - Lipid  panel; Future - TSH; Future - citalopram  (CELEXA ) 40 MG tablet; TAKE 1 TABLET(40 MG) BY MOUTH DAILY  Dispense: 90 tablet; Refill: 3  3. Hyperlipidemia, unspecified hyperlipidemia type - Continue with simvastatin  40 mg daily  - CBC with Differential/Platelet; Future - Comprehensive metabolic panel with GFR; Future - Lipid panel; Future - TSH; Future - simvastatin  (ZOCOR ) 40 MG tablet; TAKE 1 TABLET(40 MG) BY MOUTH AT BEDTIME  Dispense: 90 tablet; Refill: 3  4. Vitamin D  deficiency - Continue with Vitamin D  and Calcium   5. Need for influenza vaccination  - Flu vaccine HIGH DOSE PF(Fluzone Trivalent)  Julyana Woolverton, NP     [1]  Allergies Allergen Reactions   Ether     Lost all bodily fluids   Indomethacin Other (See Comments)    REACTION: upset stomach   Penicillins Hives   Doxycycline Rash  [2]  Current Outpatient Medications on File Prior to Visit  Medication Sig Dispense Refill   budesonide -formoterol  (SYMBICORT ) 160-4.5 MCG/ACT inhaler Inhale 2 puffs into the lungs 2 (two) times daily. 1 each 0   Calcium Carbonate-Vitamin D  (CALCIUM-VITAMIN D  PO) Take 1 tablet by mouth daily.     Multiple Vitamin (MULTIVITAMIN) tablet Take 1 tablet by mouth daily.     RESTASIS 0.05 % ophthalmic emulsion Place 1 drop into both eyes 2 (two) times daily.  3   vitamin C (ASCORBIC ACID) 250 MG tablet Take 500 mg by mouth daily.     vitamin E 180 MG (400 UNITS) capsule Take 400 Units by mouth daily.     Zinc 30 MG CAPS Take by mouth.     No current facility-administered medications on file prior to visit.

## 2024-12-05 NOTE — Patient Instructions (Addendum)
 It was great seeing you today   We will follow up with you regarding your lab work   Please let me know if you need anything

## 2024-12-09 ENCOUNTER — Ambulatory Visit: Payer: Self-pay | Admitting: Adult Health

## 2024-12-10 ENCOUNTER — Other Ambulatory Visit: Payer: Self-pay | Admitting: Adult Health

## 2024-12-10 DIAGNOSIS — F419 Anxiety disorder, unspecified: Secondary | ICD-10-CM

## 2024-12-21 ENCOUNTER — Other Ambulatory Visit: Payer: Self-pay | Admitting: Adult Health

## 2024-12-21 DIAGNOSIS — E559 Vitamin D deficiency, unspecified: Secondary | ICD-10-CM

## 2024-12-22 ENCOUNTER — Other Ambulatory Visit

## 2024-12-22 DIAGNOSIS — E559 Vitamin D deficiency, unspecified: Secondary | ICD-10-CM

## 2024-12-22 LAB — VITAMIN D 25 HYDROXY (VIT D DEFICIENCY, FRACTURES): VITD: 28.53 ng/mL — ABNORMAL LOW (ref 30.00–100.00)

## 2024-12-23 ENCOUNTER — Ambulatory Visit: Payer: Self-pay | Admitting: Adult Health

## 2024-12-30 ENCOUNTER — Other Ambulatory Visit: Payer: Self-pay

## 2024-12-30 ENCOUNTER — Ambulatory Visit: Admitting: Physician Assistant

## 2024-12-30 DIAGNOSIS — Z96651 Presence of right artificial knee joint: Secondary | ICD-10-CM

## 2024-12-30 NOTE — Progress Notes (Signed)
 "  Post-Op Visit Note   Patient: Tamara Hughes           Date of Birth: 01/27/1951           MRN: [id number] Visit Date: 12/30/2024 PCP: Merna Huxley, NP   Assessment & Plan:  Chief Complaint:  Chief Complaint  Patient presents with   Right Knee - Follow-up, Pain    12/31/2023-R TKA    Visit Diagnoses:  1. Status post total right knee replacement     Plan: Patient is a pleasant 74 year old female who comes in today 1 year status post right total knee replacement.  She is doing well.  She notes very occasional soreness.  Overall very pleased with the outcome.  Examination of the right knee reveals range of motion from 0 to 110 degrees.  Stable to valgus varus stress.  She is neurovascularly intact distally.  This point, continue to advance activity as tolerated.  Follow-up in 1 year for repeat evaluation and 2 view x-rays of the right knee.  Call with concerns or questions.  Follow-Up Instructions: Return in about 1 year (around 12/30/2025).   Orders:  Orders Placed This Encounter  Procedures   XR Knee 1-2 Views Right   No orders of the defined types were placed in this encounter.   Imaging: XR Knee 1-2 Views Right Result Date: 12/30/2024 Well-seated prosthesis without complication   PMFS History: Patient Active Problem List   Diagnosis Date Noted   Primary osteoarthritis of right knee 12/31/2023   Status post total right knee replacement 12/31/2023   OA (osteoarthritis) of knee 12/31/2023   Pre-operative clearance 11/08/2023   Varicose veins of both legs with edema 09/16/2018   Depression, major, single episode, complete remission 09/24/2017   Routine general medical examination at a health care facility 09/24/2017   Bilateral temporomandibular joint pain 04/06/2016   Disorder of left eustachian tube 04/06/2016   Otalgia of both ears 04/06/2016   Family history of breast cancer    Allergic rhinitis 07/04/2007   Past Medical History:  Diagnosis Date   Allergy     Anxiety    Arthritis    Depression    Difficult intubation    pt states she was told it was hard to intubate her due to it not being straight. This was only told to her during her hysteroscopy   Dyspnea    pt states this is usually due to weather, and she may have to use her inhaler (seasonal allergies)   Family history of breast cancer    Hyperlipidemia    Osteoarthritis of right knee    Varicose vein of leg    RLE    Family History  Problem Relation Age of Onset   Breast cancer Mother 4   Bone cancer Father 74   Breast cancer Sister 85   Heart attack Sister    Diabetes Sister    Breast cancer Maternal Aunt        dx >50   Cancer Maternal Aunt        NOS   Breast cancer Other        MGF's sister #1   Hyperlipidemia Other    Hypertension Other    Thyroid  disease Other    Stomach cancer Neg Hx    Rectal cancer Neg Hx    Pancreatic cancer Neg Hx    Colon cancer Neg Hx     Past Surgical History:  Procedure Laterality Date   GUM SURGERY  HYSTEROSCOPY     KNEE SURGERY Right    TOTAL KNEE ARTHROPLASTY Right 12/31/2023   Procedure: RIGHT TOTAL KNEE ARTHROPLASTY;  Surgeon: Jerri Kay HERO, MD;  Location: [facility] OR;  Service: Orthopedics;  Laterality: Right;   Social History   Occupational History   Not on file  Tobacco Use   Smoking status: Former    Current packs/day: 0.00    Average packs/day: 1 pack/day for 15.0 years (15.0 ttl pk-yrs)    Types: Cigarettes    Start date: 04/20/1968    Quit date: 04/21/1983    Years since quitting: 41.7   Smokeless tobacco: Never  Vaping Use   Vaping status: Never Used  Substance and Sexual Activity   Alcohol use: Yes    Alcohol/week: 1.0 standard drink of alcohol    Types: 1 Cans of beer per week    Comment: maybe one beer every couple of weeks   Drug use: No   Sexual activity: Not on file     "

## 2025-10-28 ENCOUNTER — Ambulatory Visit

## 2025-12-08 ENCOUNTER — Encounter: Admitting: Adult Health
